# Patient Record
Sex: Male | Born: 1945 | Race: Black or African American | Hispanic: No | Smoking: Never smoker
Health system: Southern US, Community
[De-identification: ages and names within clinical notes are randomized; demographics above are authoritative.]

## PROBLEM LIST (undated history)

## (undated) DIAGNOSIS — E119 Type 2 diabetes mellitus without complications: Secondary | ICD-10-CM

## (undated) DIAGNOSIS — H5440 Blindness, one eye, unspecified eye: Secondary | ICD-10-CM

## (undated) DIAGNOSIS — Z95810 Presence of automatic (implantable) cardiac defibrillator: Secondary | ICD-10-CM

## (undated) DIAGNOSIS — I428 Other cardiomyopathies: Secondary | ICD-10-CM

## (undated) DIAGNOSIS — K219 Gastro-esophageal reflux disease without esophagitis: Secondary | ICD-10-CM

## (undated) DIAGNOSIS — E785 Hyperlipidemia, unspecified: Secondary | ICD-10-CM

## (undated) DIAGNOSIS — F32A Depression, unspecified: Secondary | ICD-10-CM

## (undated) DIAGNOSIS — I5082 Biventricular heart failure: Secondary | ICD-10-CM

## (undated) DIAGNOSIS — F329 Major depressive disorder, single episode, unspecified: Secondary | ICD-10-CM

## (undated) DIAGNOSIS — H543 Unqualified visual loss, both eyes: Secondary | ICD-10-CM

## (undated) DIAGNOSIS — I10 Essential (primary) hypertension: Secondary | ICD-10-CM

## (undated) HISTORY — PX: CARDIAC DEFIBRILLATOR PLACEMENT: SHX171

## (undated) HISTORY — PX: CARDIAC CATHETERIZATION: SHX172

---

## 2013-01-12 ENCOUNTER — Ambulatory Visit: Payer: Self-pay | Admitting: Family

## 2013-02-13 ENCOUNTER — Inpatient Hospital Stay (HOSPITAL_COMMUNITY)
Admission: EM | Admit: 2013-02-13 | Discharge: 2013-02-14 | DRG: 178 | Disposition: A | Discharge status: Home Health | Payer: Medicare Other | Attending: Internal Medicine | Admitting: Internal Medicine

## 2013-02-13 ENCOUNTER — Encounter (HOSPITAL_COMMUNITY): Payer: Self-pay | Admitting: Family Medicine

## 2013-02-13 DIAGNOSIS — R059 Cough, unspecified: Secondary | ICD-10-CM

## 2013-02-13 DIAGNOSIS — E1165 Type 2 diabetes mellitus with hyperglycemia: Secondary | ICD-10-CM | POA: Diagnosis present

## 2013-02-13 DIAGNOSIS — Z7982 Long term (current) use of aspirin: Secondary | ICD-10-CM

## 2013-02-13 DIAGNOSIS — Z79899 Other long term (current) drug therapy: Secondary | ICD-10-CM

## 2013-02-13 DIAGNOSIS — I509 Heart failure, unspecified: Secondary | ICD-10-CM | POA: Diagnosis present

## 2013-02-13 DIAGNOSIS — E785 Hyperlipidemia, unspecified: Secondary | ICD-10-CM

## 2013-02-13 DIAGNOSIS — I428 Other cardiomyopathies: Secondary | ICD-10-CM | POA: Diagnosis present

## 2013-02-13 DIAGNOSIS — Z7902 Long term (current) use of antithrombotics/antiplatelets: Secondary | ICD-10-CM

## 2013-02-13 DIAGNOSIS — I10 Essential (primary) hypertension: Secondary | ICD-10-CM

## 2013-02-13 DIAGNOSIS — Z95810 Presence of automatic (implantable) cardiac defibrillator: Secondary | ICD-10-CM

## 2013-02-13 DIAGNOSIS — R05 Cough: Secondary | ICD-10-CM

## 2013-02-13 DIAGNOSIS — J690 Pneumonitis due to inhalation of food and vomit: Principal | ICD-10-CM | POA: Diagnosis present

## 2013-02-13 DIAGNOSIS — E119 Type 2 diabetes mellitus without complications: Secondary | ICD-10-CM

## 2013-02-13 DIAGNOSIS — IMO0002 Reserved for concepts with insufficient information to code with codable children: Secondary | ICD-10-CM | POA: Diagnosis present

## 2013-02-13 DIAGNOSIS — I429 Cardiomyopathy, unspecified: Secondary | ICD-10-CM

## 2013-02-13 DIAGNOSIS — I5022 Chronic systolic (congestive) heart failure: Secondary | ICD-10-CM | POA: Diagnosis present

## 2013-02-13 DIAGNOSIS — R0602 Shortness of breath: Secondary | ICD-10-CM

## 2013-02-13 NOTE — ED Provider Notes (Signed)
CSN: [id number]     Arrival date & time 02/13/13  2302 History   First MD Initiated Contact with Patient 02/13/13 2306     Chief Complaint  Patient presents with  . Fatigue  . Emesis   (Consider location/radiation/quality/duration/timing/severity/associated sxs/prior Treatment) HPI  Patient is a 67 yo man with diabetes and cardiomyopathy who is BIB EMS from home. Hx obtained mostly from dtr, with whom he lives. Doing well today until around noon when he seemed to become fatigued and weak. Within the next hour he developed multiple episiodes of emesis. Dtr says that he vomited for 10-45m straight. [place] [address] [place]   When patient got home, he started coughing. He went to bed and daughter noticed, shortly thereafter, that his cough worsened and he seemed to have increased WOB. This prompted her to call EMS. Pt tx with Zofran 4mg  iv en route.   Patient says he feels "OK, I guess" other than cough. He says he has felt intermittently SOB. No known fever. Intermittent chest pain which is aching, mild and nonradiating. Denies CP at this time.   Dtr says that the patient has not taken any of his meds for the past 3 days - including Lasix 40mg  bid. Daughter gave him 80mg  Lasix po around 1900 and he has been urinating more than usual. She gave him all of his usual meds tonight.   Past Medical History  Diagnosis Date  . CHF (congestive heart failure)    No past surgical history on file. No family history on file. History  Substance Use Topics  . Smoking status: Not on file  . Smokeless tobacco: Not on file  . Alcohol Use: Not on file    Review of Systems Unable to obtain from the patient due to respiratory distress. Note that level V caveat applies.   Allergies  Review of patient's allergies indicates not on file.  Home Medications  No current outpatient prescriptions on file. SpO2 99% Physical Exam Gen: well developed and well  nourished appearing, respiratory distress noted Head: NCAT Eyes: PERL, EOMI Nose: no epistaixis or rhinorrhea Mouth/throat: mucosa is moist and pink Neck: supple, no stridor, JVD to the angle of the mandible Lungs: Respiratory rate in the 30s, bilateral rales right greater than left, accessory muscle use and respiratory distress Abd: soft, notender, nondistended Back: no ttp, no cva ttp Skin: no rashese, wnl Neuro: CN ii-xii grossly intact, no focal deficits Psyche; mildly anxious, cooperative.   ED Course  Procedures (including critical care time) Results for orders placed during the hospital encounter of 02/13/13 (from the past 48 hour(s))  URINALYSIS, ROUTINE W REFLEX MICROSCOPIC     Status: Abnormal   Collection Time    02/13/13 11:30 PM      Result Value Range   Color, Urine YELLOW  YELLOW   APPearance CLEAR  CLEAR   Specific Gravity, Urine 1.009  1.005 - 1.030   pH 5.5  5.0 - 8.0   Glucose, UA NEGATIVE  NEGATIVE mg/dL   Hgb urine dipstick NEGATIVE  NEGATIVE   Bilirubin Urine NEGATIVE  NEGATIVE   Ketones, ur NEGATIVE  NEGATIVE mg/dL   Protein, ur 30 (*) NEGATIVE mg/dL   Urobilinogen, UA 1.0  0.0 - 1.0 mg/dL   Nitrite NEGATIVE  NEGATIVE   Leukocytes, UA NEGATIVE  NEGATIVE  URINE MICROSCOPIC-ADD ON     Status: Abnormal   Collection Time    02/13/13 11:30  PM      Result Value Range   WBC, UA 0-2  <3 WBC/hpf   RBC / HPF 0-2  <3 RBC/hpf   Bacteria, UA RARE  RARE   Casts HYALINE CASTS (*) NEGATIVE   Urine-Other MUCOUS PRESENT    CBC WITH DIFFERENTIAL     Status: Abnormal   Collection Time    02/13/13 11:46 PM      Result Value Range   WBC 8.2  4.0 - 10.5 K/uL   RBC 5.15  4.22 - 5.81 MIL/uL   Hemoglobin 16.0  13.0 - 17.0 g/dL   HCT 16.1  09.6 - 04.5 %   MCV 90.3  78.0 - 100.0 fL   MCH 31.1  26.0 - 34.0 pg   MCHC 34.4  30.0 - 36.0 g/dL   RDW 40.9  81.1 - 91.4 %   Platelets 156  150 - 400 K/uL   Neutrophils Relative % 78 (*) 43 - 77 %   Neutro Abs 6.3  1.7 - 7.7  K/uL   Lymphocytes Relative 8 (*) 12 - 46 %   Lymphs Abs 0.7  0.7 - 4.0 K/uL   Monocytes Relative 14 (*) 3 - 12 %   Monocytes Absolute 1.1 (*) 0.1 - 1.0 K/uL   Eosinophils Relative 0  0 - 5 %   Eosinophils Absolute 0.0  0.0 - 0.7 K/uL   Basophils Relative 0  0 - 1 %   Basophils Absolute 0.0  0.0 - 0.1 K/uL  COMPREHENSIVE METABOLIC PANEL     Status: Abnormal   Collection Time    02/13/13 11:46 PM      Result Value Range   Sodium 139  135 - 145 mEq/L   Potassium 3.5  3.5 - 5.1 mEq/L   Chloride 102  96 - 112 mEq/L   CO2 24  19 - 32 mEq/L   Glucose, Bld 158 (*) 70 - 99 mg/dL   BUN 13  6 - 23 mg/dL   Creatinine, Ser 7.82  0.50 - 1.35 mg/dL   Calcium 9.1  8.4 - 95.6 mg/dL   Total Protein 6.9  6.0 - 8.3 g/dL   Albumin 3.6  3.5 - 5.2 g/dL   AST 22  0 - 37 U/L   ALT 11  0 - 53 U/L   Alkaline Phosphatase 194 (*) 39 - 117 U/L   Total Bilirubin 1.9 (*) 0.3 - 1.2 mg/dL   GFR calc non Af Amer >90  >90 mL/min   GFR calc Af Amer >90  >90 mL/min   Comment: (NOTE)     The eGFR has been calculated using the CKD EPI equation.     This calculation has not been validated in all clinical situations.     eGFR's persistently <90 mL/min signify possible Chronic Kidney     Disease.  LACTIC ACID, PLASMA     Status: None   Collection Time    02/13/13 11:46 PM      Result Value Range   Lactic Acid, Venous 2.1  0.5 - 2.2 mmol/L  PRO B NATRIURETIC PEPTIDE     Status: Abnormal   Collection Time    02/13/13 11:46 PM      Result Value Range   Pro B Natriuretic peptide (BNP) 7676.0 (*) 0 - 125 pg/mL  POCT I-STAT, CHEM 8     Status: Abnormal   Collection Time    02/14/13 12:04 AM      Result Value Range   Sodium  143  135 - 145 mEq/L   Potassium 3.4 (*) 3.5 - 5.1 mEq/L   Chloride 104  96 - 112 mEq/L   BUN 11  6 - 23 mg/dL   Creatinine, Ser 4.09  0.50 - 1.35 mg/dL   Glucose, Bld 811 (*) 70 - 99 mg/dL   Calcium, Ion 9.14 (*) 1.13 - 1.30 mmol/L   TCO2 25  0 - 100 mmol/L   Hemoglobin 17.7 (*) 13.0 -  17.0 g/dL   HCT 78.2  95.6 - 21.3 %  POCT I-STAT TROPONIN I     Status: None   Collection Time    02/14/13 12:46 AM      Result Value Range   Troponin i, poc 0.02  0.00 - 0.08 ng/mL   Comment 3            Comment: Due to the release kinetics of cTnI,     a negative result within the first hours     of the onset of symptoms does not rule out     myocardial infarction with certainty.     If myocardial infarction is still suspected,     repeat the test at appropriate intervals.  TROPONIN I     Status: None   Collection Time    02/14/13  1:53 AM      Result Value Range   Troponin I <0.30  <0.30 ng/mL   Comment:            Due to the release kinetics of cTnI,     a negative result within the first hours     of the onset of symptoms does not rule out     myocardial infarction with certainty.     If myocardial infarction is still suspected,     repeat the test at appropriate intervals.  BASIC METABOLIC PANEL     Status: Abnormal   Collection Time    02/14/13  3:20 AM      Result Value Range   Sodium 139  135 - 145 mEq/L   Potassium 3.4 (*) 3.5 - 5.1 mEq/L   Chloride 102  96 - 112 mEq/L   CO2 24  19 - 32 mEq/L   Glucose, Bld 237 (*) 70 - 99 mg/dL   BUN 12  6 - 23 mg/dL   Creatinine, Ser 0.86  0.50 - 1.35 mg/dL   Calcium 8.9  8.4 - 57.8 mg/dL   GFR calc non Af Amer >90  >90 mL/min   GFR calc Af Amer >90  >90 mL/min   Comment: (NOTE)     The eGFR has been calculated using the CKD EPI equation.     This calculation has not been validated in all clinical situations.     eGFR's persistently <90 mL/min signify possible Chronic Kidney     Disease.  CBC     Status: Abnormal   Collection Time    02/14/13  3:20 AM      Result Value Range   WBC 11.1 (*) 4.0 - 10.5 K/uL   RBC 4.85  4.22 - 5.81 MIL/uL   Hemoglobin 15.2  13.0 - 17.0 g/dL   HCT 46.9  62.9 - 52.8 %   MCV 88.9  78.0 - 100.0 fL   MCH 31.3  26.0 - 34.0 pg   MCHC 35.3  30.0 - 36.0 g/dL   RDW 41.3  24.4 - 01.0 %    Platelets 143 (*) 150 - 400 K/uL  TROPONIN I  Status: None   Collection Time    02/14/13  8:10 AM      Result Value Range   Troponin I <0.30  <0.30 ng/mL   Comment:            Due to the release kinetics of cTnI,     a negative result within the first hours     of the onset of symptoms does not rule out     myocardial infarction with certainty.     If myocardial infarction is still suspected,     repeat the test at appropriate intervals.  LIPASE, BLOOD     Status: None   Collection Time    02/14/13  8:10 AM      Result Value Range   Lipase 17  11 - 59 U/L  GLUCOSE, CAPILLARY     Status: Abnormal   Collection Time    02/14/13  8:25 AM      Result Value Range   Glucose-Capillary 204 (*) 70 - 99 mg/dL  GLUCOSE, CAPILLARY     Status: Abnormal   Collection Time    02/14/13 11:35 AM      Result Value Range   Glucose-Capillary 173 (*) 70 - 99 mg/dL  TROPONIN I     Status: None   Collection Time    02/14/13  2:14 PM      Result Value Range   Troponin I <0.30  <0.30 ng/mL   Comment:            Due to the release kinetics of cTnI,     a negative result within the first hours     of the onset of symptoms does not rule out     myocardial infarction with certainty.     If myocardial infarction is still suspected,     repeat the test at appropriate intervals.    MDM   Patient presents with mixed picture of aspiration pneumonia and pulmonary edema. We are treating empirically with Clindamycin and blood cultures have been obtained. Tx with single dose of IV lasix. Supportive tx with supplemental 02. Case discussed with hospitalist who will see and admit.  CRITICAL CARE Performed by: Brandt Loosen   Total critical care time: 47m  Critical care time was exclusive of separately billable procedures and treating other patients.  Critical care was necessary to treat or prevent imminent or life-threatening deterioration.  Critical care was time spent personally by me on the  following activities: development of treatment plan with patient and/or surrogate as well as nursing, discussions with consultants, evaluation of patient's response to treatment, examination of patient, obtaining history from patient or surrogate, ordering and performing treatments and interventions, ordering and review of laboratory studies, ordering and review of radiographic studies, pulse oximetry and re-evaluation of patient's condition.     Brandt Loosen, MD 02/15/13 [phone]

## 2013-02-13 NOTE — ED Notes (Signed)
Pt from home via [facility] with c/o lethargy, fatigue, nausea, and emesisx1. Pt with hx CHF, presents with BLE edema +1, lungs diminished at bilateral bases, dry non-productive cough, and unremarkable 12-lead per EMS.  EMS reports pt had slight tremor but was fixed with a warm blanket.  Pt placed on non-rebreather by EMS with improvement to color and alertness.  Pt given 4mg  Zofran IV PTA.

## 2013-02-14 ENCOUNTER — Emergency Department (HOSPITAL_COMMUNITY): Payer: Medicare Other

## 2013-02-14 ENCOUNTER — Encounter (HOSPITAL_COMMUNITY): Payer: Self-pay | Admitting: Internal Medicine

## 2013-02-14 DIAGNOSIS — I428 Other cardiomyopathies: Secondary | ICD-10-CM

## 2013-02-14 DIAGNOSIS — I429 Cardiomyopathy, unspecified: Secondary | ICD-10-CM | POA: Diagnosis present

## 2013-02-14 DIAGNOSIS — E1165 Type 2 diabetes mellitus with hyperglycemia: Secondary | ICD-10-CM | POA: Diagnosis present

## 2013-02-14 DIAGNOSIS — E119 Type 2 diabetes mellitus without complications: Secondary | ICD-10-CM

## 2013-02-14 DIAGNOSIS — I10 Essential (primary) hypertension: Secondary | ICD-10-CM | POA: Diagnosis present

## 2013-02-14 DIAGNOSIS — E785 Hyperlipidemia, unspecified: Secondary | ICD-10-CM | POA: Diagnosis present

## 2013-02-14 DIAGNOSIS — R05 Cough: Secondary | ICD-10-CM

## 2013-02-14 LAB — POCT I-STAT TROPONIN I

## 2013-02-14 LAB — URINALYSIS, ROUTINE W REFLEX MICROSCOPIC
Glucose, UA: NEGATIVE mg/dL
Leukocytes, UA: NEGATIVE
Nitrite: NEGATIVE
Protein, ur: 30 mg/dL — AB
pH: 5.5 (ref 5.0–8.0)

## 2013-02-14 LAB — BASIC METABOLIC PANEL
BUN: 12 mg/dL (ref 6–23)
Calcium: 8.9 mg/dL (ref 8.4–10.5)
Creatinine, Ser: 0.8 mg/dL (ref 0.50–1.35)
GFR calc Af Amer: >90 mL/min (ref >90)
GFR calc non Af Amer: >90 mL/min (ref >90)
Potassium: 3.4 mEq/L — ABNORMAL LOW (ref 3.5–5.1)

## 2013-02-14 LAB — URINE MICROSCOPIC-ADD ON

## 2013-02-14 LAB — LACTIC ACID, PLASMA: Lactic Acid, Venous: 2.1 mmol/L (ref 0.5–2.2)

## 2013-02-14 LAB — CBC WITH DIFFERENTIAL/PLATELET
Basophils Relative: 0 % (ref 0–1)
Eosinophils Absolute: 0 10*3/uL (ref 0.0–0.7)
Eosinophils Relative: 0 % (ref 0–5)
HCT: 46.5 % (ref 39.0–52.0)
Hemoglobin: 16 g/dL (ref 13.0–17.0)
MCH: 31.1 pg (ref 26.0–34.0)
MCHC: 34.4 g/dL (ref 30.0–36.0)
MCV: 90.3 fL (ref 78.0–100.0)
Monocytes Absolute: 1.1 10*3/uL — ABNORMAL HIGH (ref 0.1–1.0)
Monocytes Relative: 14 % — ABNORMAL HIGH (ref 3–12)
Neutrophils Relative %: 78 % — ABNORMAL HIGH (ref 43–77)

## 2013-02-14 LAB — COMPREHENSIVE METABOLIC PANEL
Albumin: 3.6 g/dL (ref 3.5–5.2)
BUN: 13 mg/dL (ref 6–23)
Creatinine, Ser: 0.82 mg/dL (ref 0.50–1.35)
GFR calc Af Amer: >90 mL/min (ref >90)
Total Protein: 6.9 g/dL (ref 6.0–8.3)

## 2013-02-14 LAB — GLUCOSE, CAPILLARY: Glucose-Capillary: 204 mg/dL — ABNORMAL HIGH (ref 70–99)

## 2013-02-14 LAB — TROPONIN I
Troponin I: <0.3 ng/mL (ref <0.30)
Troponin I: <0.3 ng/mL (ref <0.30)
Troponin I: <0.3 ng/mL (ref <0.30)

## 2013-02-14 LAB — POCT I-STAT, CHEM 8
Calcium, Ion: 1.09 mmol/L — ABNORMAL LOW (ref 1.13–1.30)
Chloride: 104 mEq/L (ref 96–112)
HCT: 52 % (ref 39.0–52.0)
Hemoglobin: 17.7 g/dL — ABNORMAL HIGH (ref 13.0–17.0)

## 2013-02-14 LAB — CBC
Hemoglobin: 15.2 g/dL (ref 13.0–17.0)
MCHC: 35.3 g/dL (ref 30.0–36.0)
Platelets: 143 10*3/uL — ABNORMAL LOW (ref 150–400)
RDW: 15.4 % (ref 11.5–15.5)

## 2013-02-14 LAB — LIPASE, BLOOD: Lipase: 17 U/L (ref 11–59)

## 2013-02-14 LAB — PRO B NATRIURETIC PEPTIDE: Pro B Natriuretic peptide (BNP): 7676 pg/mL — ABNORMAL HIGH (ref 0–125)

## 2013-02-14 MED ORDER — ONDANSETRON HCL 4 MG/2ML IJ SOLN: 4.0000 mg | Freq: Four times a day (QID) | INTRAMUSCULAR | Status: DC | PRN

## 2013-02-14 MED ORDER — SODIUM CHLORIDE 0.9 % IJ SOLN: 3.0000 mL | Freq: Two times a day (BID) | INTRAMUSCULAR | Status: DC

## 2013-02-14 MED ORDER — ZOLPIDEM TARTRATE 5 MG PO TABS: 5.0000 mg | ORAL_TABLET | Freq: Every evening | ORAL | Status: DC | PRN

## 2013-02-14 MED ORDER — ACETAMINOPHEN 325 MG PO TABS: 650.0000 mg | ORAL_TABLET | Freq: Four times a day (QID) | ORAL | Status: DC | PRN

## 2013-02-14 MED ORDER — ENOXAPARIN SODIUM 40 MG/0.4ML ~~LOC~~ SOLN
40.0000 mg | SUBCUTANEOUS | Status: DC
  Filled 2013-02-14: qty 0.4

## 2013-02-14 MED ORDER — SIMVASTATIN 5 MG PO TABS
5.0000 mg | ORAL_TABLET | Freq: Every day | ORAL | Status: DC
  Filled 2013-02-14: qty 1

## 2013-02-14 MED ORDER — CLOPIDOGREL BISULFATE 75 MG PO TABS
75.0000 mg | ORAL_TABLET | Freq: Every day | ORAL | Status: DC
  Administered 2013-02-14: 09:00:00 75 mg via ORAL
  Filled 2013-02-14 (×2): qty 1

## 2013-02-14 MED ORDER — LEVALBUTEROL HCL 0.63 MG/3ML IN NEBU
0.6300 mg | INHALATION_SOLUTION | Freq: Four times a day (QID) | RESPIRATORY_TRACT | Status: DC
  Filled 2013-02-14 (×4): qty 3

## 2013-02-14 MED ORDER — ACETAMINOPHEN 650 MG RE SUPP: 650.0000 mg | Freq: Four times a day (QID) | RECTAL | Status: DC | PRN

## 2013-02-14 MED ORDER — INSULIN GLARGINE 100 UNIT/ML ~~LOC~~ SOLN: 25.0000 [IU] | Freq: Every day | SUBCUTANEOUS | Status: DC

## 2013-02-14 MED ORDER — ASPIRIN EC 81 MG PO TBEC
81.0000 mg | DELAYED_RELEASE_TABLET | Freq: Every day | ORAL | Status: DC
  Administered 2013-02-14: 10:00:00 81 mg via ORAL
  Filled 2013-02-14: qty 1

## 2013-02-14 MED ORDER — SODIUM CHLORIDE 0.9 % IJ SOLN
3.0000 mL | Freq: Two times a day (BID) | INTRAMUSCULAR | Status: DC
  Administered 2013-02-14: 3 mL via INTRAVENOUS

## 2013-02-14 MED ORDER — CLINDAMYCIN PHOSPHATE 600 MG/50ML IV SOLN
600.0000 mg | Freq: Three times a day (TID) | INTRAVENOUS | Status: DC
  Administered 2013-02-14: 05:00:00 600 mg via INTRAVENOUS
  Filled 2013-02-14 (×4): qty 50

## 2013-02-14 MED ORDER — CLINDAMYCIN HCL 300 MG PO CAPS: 300.0000 mg | ORAL_CAPSULE | Freq: Three times a day (TID) | ORAL | Status: DC

## 2013-02-14 MED ORDER — INSULIN ASPART 100 UNIT/ML ~~LOC~~ SOLN
0.0000 [IU] | Freq: Three times a day (TID) | SUBCUTANEOUS | Status: DC
  Administered 2013-02-14 (×2): 2 [IU] via SUBCUTANEOUS

## 2013-02-14 MED ORDER — ONDANSETRON HCL 4 MG PO TABS: 4.0000 mg | ORAL_TABLET | Freq: Four times a day (QID) | ORAL | Status: DC | PRN

## 2013-02-14 MED ORDER — CLINDAMYCIN PHOSPHATE 600 MG/50ML IV SOLN
600.0000 mg | Freq: Once | INTRAVENOUS | Status: AC
  Administered 2013-02-14: 600 mg via INTRAVENOUS
  Filled 2013-02-14: qty 50

## 2013-02-14 MED ORDER — CARVEDILOL 6.25 MG PO TABS
6.2500 mg | ORAL_TABLET | Freq: Two times a day (BID) | ORAL | Status: DC
  Administered 2013-02-14: 6.25 mg via ORAL
  Filled 2013-02-14 (×3): qty 1

## 2013-02-14 MED ORDER — FUROSEMIDE 40 MG PO TABS
40.0000 mg | ORAL_TABLET | Freq: Two times a day (BID) | ORAL | Status: DC
  Filled 2013-02-14 (×2): qty 1

## 2013-02-14 MED ORDER — FUROSEMIDE 10 MG/ML IJ SOLN
40.0000 mg | Freq: Two times a day (BID) | INTRAMUSCULAR | Status: DC
  Administered 2013-02-14: 40 mg via INTRAVENOUS
  Filled 2013-02-14 (×3): qty 4

## 2013-02-14 MED ORDER — INSULIN GLARGINE 100 UNIT/ML ~~LOC~~ SOLN
25.0000 [IU] | Freq: Every day | SUBCUTANEOUS | Status: DC
  Filled 2013-02-14: qty 0.25

## 2013-02-14 NOTE — Discharge Summary (Signed)
PATIENT DETAILS Name: Gary Gallegos Age: 67 y.o. Sex: male Date of Birth: 03-Dec-1945 MRN: [id number]. Admit Date: 02/13/2013 Admitting Physician: Eduard Clos, MD WUJ:[id number] Not In System  Recommendations for Outpatient Follow-up:  1. Please recheck Chemistry/LFT's during next visit-had mild Alk Phosphatase elevation-if still persistent then please commence on further work up 2. Will need referral to cardiology  PRIMARY DISCHARGE DIAGNOSIS:  Active Problems:   Probable Aspiration PNA   Nausea/Vomiting   Cardiomyopathy   Diabetes mellitus   HTN (hypertension)   Hyperlipidemia      PAST MEDICAL HISTORY: Past Medical History  Diagnosis Date  . CHF (congestive heart failure)   . Diabetes mellitus without complication     DISCHARGE MEDICATIONS:   Medication List         aspirin EC 81 MG tablet  Take 81 mg by mouth daily.     carvedilol 6.25 MG tablet  Commonly known as:  COREG  Take 6.25 mg by mouth 2 (two) times daily with a meal.     clindamycin 300 MG capsule  Commonly known as:  CLEOCIN  Take 1 capsule (300 mg total) by mouth 3 (three) times daily.     clopidogrel 75 MG tablet  Commonly known as:  PLAVIX  Take 75 mg by mouth daily.     furosemide 40 MG tablet  Commonly known as:  LASIX  Take 40 mg by mouth 2 (two) times daily.     insulin glargine 100 UNIT/ML injection  Commonly known as:  LANTUS  Inject 0.25 mLs (25 Units total) into the skin at bedtime.     ondansetron 4 MG tablet  Commonly known as:  ZOFRAN  Take 1 tablet (4 mg total) by mouth every 6 (six) hours as needed for nausea.     simvastatin 5 MG tablet  Commonly known as:  ZOCOR  Take 5 mg by mouth at bedtime.     zolpidem 5 MG tablet  Commonly known as:  AMBIEN  Take 5 mg by mouth at bedtime as needed for sleep.        ALLERGIES:   Allergies  Allergen Reactions  . Penicillins Nausea And Vomiting  . Sulfa Antibiotics Nausea And Vomiting    BRIEF HPI:  See H&P, Labs,  Consult and Test reports for all details in brief, Gary Gallegos is a 67 y.o. male with history of cardiomyopathy status post AICD placement, diabetes mellitus type 2, hyperlipidemia who had recently moved to [place] last month was traveling with his daughter in the car when suddenly he started having nausea and vomiting. Patient had multiple episodes of nausea vomiting in 15 minutes period. After which patient had persistent nonproductive cough with mild shortness of breath. Since the cough was not relieving patient came to the ER.  CONSULTATIONS:   cardiology  PERTINENT RADIOLOGIC STUDIES: Dg Chest 2 View  02/14/2013   CLINICAL DATA:  Weakness.  EXAM: CHEST  2 VIEW  COMPARISON:  None.  FINDINGS: Left AICD is in place with single lead tip in the right ventricle. Cardiomegaly. No focal airspace opacity or edema. There is a small right pleural effusion. No pneumothorax. No acute bony abnormality.  IMPRESSION: Cardiomegaly. Small right pleural effusion.   Electronically Signed   By: Charlett Nose   On: 02/14/2013 00:43     PERTINENT LAB RESULTS: CBC:  Recent Labs  02/13/13 2346 02/14/13 0004 02/14/13 0320  WBC 8.2  --  11.1*  HGB 16.0 17.7* 15.2  HCT 46.5 52.0 43.1  PLT 156  --  143*   CMET CMP     Component Value Date/Time   NA 139 02/14/2013 0320   K 3.4* 02/14/2013 0320   CL 102 02/14/2013 0320   CO2 24 02/14/2013 0320   GLUCOSE 237* 02/14/2013 0320   BUN 12 02/14/2013 0320   CREATININE 0.80 02/14/2013 0320   CALCIUM 8.9 02/14/2013 0320   PROT 6.9 02/13/2013 2346   ALBUMIN 3.6 02/13/2013 2346   AST 22 02/13/2013 2346   ALT 11 02/13/2013 2346   ALKPHOS 194* 02/13/2013 2346   BILITOT 1.9* 02/13/2013 2346   GFRNONAA >90 02/14/2013 0320   GFRAA >90 02/14/2013 0320    GFR Estimated Creatinine Clearance: 86.1 ml/min (by C-G formula based on Cr of 0.8).  Recent Labs  02/14/13 0810  LIPASE 17    Recent Labs  02/14/13 0153 02/14/13 0810  TROPONINI <0.30 <0.30   No components  found with this basename: POCBNP,  No results found for this basename: DDIMER,  in the last 72 hours No results found for this basename: HGBA1C,  in the last 72 hours No results found for this basename: CHOL, HDL, LDLCALC, TRIG, CHOLHDL, LDLDIRECT,  in the last 72 hours No results found for this basename: TSH, T4TOTAL, FREET3, T3FREE, THYROIDAB,  in the last 72 hours No results found for this basename: VITAMINB12, FOLATE, FERRITIN, TIBC, IRON, RETICCTPCT,  in the last 72 hours Coags: No results found for this basename: PT, INR,  in the last 72 hours Microbiology: No results found for this or any previous visit (from the past 240 hour(s)).   BRIEF HOSPITAL COURSE:  Nausea/Vomiting  -resolved.  -apparently occurred after patient ate "chinese food", he was managed with supportive care, he has now tolerated a regular diet, with no recurrence of any of his symptoms. Abdomen is benign on exam  -suspect this may have been a viral syndrome/?food poisoning  - lipase was within normal limits -cardiac enzymes neg   Shortness of Breath  -?aspiration-seems to have resolved with IV Clindamycin and Lasix  -doubt any significant CHF component  -c/w Clindamycin on discharge for 4 more days and then stop -lungs clear on exam, O2 sat on room air in the high 90's, ambulated-O2 sat remained in the 90's on room air as well.  Hx of CHF  -not known whether this is systolic or diastolic, suspect the former  -c/w IV Lasix for now, clinically compensated this am  -he plans to be living in [place]-he needs to find a cardiologist of his choice-spoke with patient's daughter-patient apparently has a appt with a MD at [facility] on 9/11, he will need referral to cardiology. I have provided her the no for [facility], so she can call and make an appointment.  DM  -CBG stable  -c/w Lantus on discharge  Dyslipidemia  -c/w Statins   HTN  -controlled with current meds   Mild Alk Phosphatase  elevation  - Abd Ultrasound was contemplated, but abdomen is benign on exam, lipase normal, patient tolerating diet-therefore will have patient follow up with PCP, where a repeat LFT's can be drawn, if Alk Phos is still elevated, then further work up can be then commenced. For now will hold off on Abd Ultrasound. This was explained to both patient and daughter, who are agreeable.  TODAY-DAY OF DISCHARGE:  Subjective:   Gary Gallegos today has no headache,no chest abdominal pain,no new weakness tingling or numbness, feels much better wants to go home today.  Objective:   Blood pressure 111/76, pulse 90, temperature 99 F (37.2 C), temperature source Oral, resp. rate 22, height 5\' 7"  (1.702 m), weight 75.3 kg (166 lb 0.1 oz), SpO2 97.00%.  Intake/Output Summary (Last 24 hours) at 02/14/13 1408 Last data filed at 02/14/13 0400  Gross per 24 hour  Intake      0 ml  Output    300 ml  Net   -300 ml   Filed Weights   02/14/13 0203 02/14/13 0506  Weight: 75.7 kg (166 lb 14.2 oz) 75.3 kg (166 lb 0.1 oz)    Exam Awake Alert, Oriented *3, No new F.N deficits, Normal affect [place].AT,PERRAL Supple Neck,No JVD, No cervical lymphadenopathy appriciated.  Symmetrical Chest wall movement, Good air movement bilaterally, CTAB RRR,No Gallops,Rubs or new Murmurs, No Parasternal Heave +ve B.Sounds, Abd Soft, Non tender, No organomegaly appriciated, No rebound -guarding or rigidity. No Cyanosis, Clubbing or edema, No new Rash or bruise  DISCHARGE CONDITION: Stable  DISPOSITION: Home with home health services  DISCHARGE INSTRUCTIONS:    Activity:  As tolerated with Full fall precautions use walker/cane & assistance as needed  Diet recommendation: Diabetic Diet Heart Healthy diet   Discharge Orders   Future Orders Complete By Expires   (HEART FAILURE PATIENTS) Call MD:  Anytime you have any of the following symptoms: 1) 3 pound weight gain in 24 hours or 5 pounds in 1 week 2) shortness of  breath, with or without a dry hacking cough 3) swelling in the hands, feet or stomach 4) if you have to sleep on extra pillows at night in order to breathe.  As directed    Call MD for:  persistant nausea and vomiting  As directed    Call MD for:  severe uncontrolled pain  As directed    Diet - low sodium heart healthy  As directed    Increase activity slowly  As directed       Follow-up Information   Please follow up. (keep existing appt on 02/26/13)    Contact information:   Primary Care Practitioner      Follow up with [facility] Main Office [facility]). Schedule an appointment as soon as possible for a visit in 2 weeks. (Cardiologist of your choice)    Specialty:  Cardiology   Contact information:   [address], [address] [place] [address] [phone]      Total Time spent on discharge equals 45 minutes.  SignedJeoffrey Massed 02/14/2013 2:08 PM

## 2013-02-14 NOTE — Progress Notes (Signed)
Pt admitted to the unit with family. Pt is stable, alert and oriented per baseline. Oriented to room, staff, and call bell. Educated to call for any assistance. Bed in lowest position, call bell within reach- will continue to monitor. 

## 2013-02-14 NOTE — Progress Notes (Signed)
Pt is at 97% O2 saturation on room air.   Will continue to monitor.  Peter Congo RN

## 2013-02-14 NOTE — Progress Notes (Signed)
PATIENT DETAILS Name: Gary Gallegos Age: 67 y.o. Sex: male Date of Birth: 12/17/1945 Admit Date: 02/13/2013 Admitting Physician Eduard Clos, MD [id number] Not In System  Subjective: No further nausea or vomiting, tolerated diet well this am. No shortness of breath-lying comfortably this am.  Assessment/Plan: Nausea/Vomiting -resolved. -apparently occurred after patient ate "chinese food" -abdomen is benign on exam -tolerated heart healthy diet this am for breakfast -suspect this may have been a viral syndrome/?food poisoning -will check lipase -cardiac enzymes neg so far  Shortness of Breath -?aspiration-seems to have resolved with IV Clindamycin and Lasix -doubt any significant CHF component -c/w Clindamycin -lungs clear on exam, O2 sat on room air in the high 90's  Hx of CHF -not known whether this is systolic or diastolic, suspect the former -c/w IV Lasix for now, clinically compensated this am -he plans to be living in [place]-he needs to find a cardiologist of his choice  DM -CBG stable -c/w Lantus/SSI  Dyslipidemia -c/w Statins  HTN -controlled with current meds  Mild Alk Phosphatase elevation -await Abd Ultrasound  Disposition: Remain inpatient-Suspect home later today  DVT Prophylaxis: Prophylactic Lovenox   Code Status: Full code   Family Communication Left message for daughter  Procedures:  None  CONSULTS:  None   MEDICATIONS: Scheduled Meds: . aspirin EC  81 mg Oral Daily  . carvedilol  6.25 mg Oral BID WC  . clindamycin (CLEOCIN) IV  600 mg Intravenous Q8H  . clopidogrel  75 mg Oral Q breakfast  . enoxaparin (LOVENOX) injection  40 mg Subcutaneous Q24H  . furosemide  40 mg Intravenous Q12H  . insulin aspart  0-9 Units Subcutaneous TID WC  . insulin glargine  25 Units Subcutaneous QHS  . levalbuterol  0.63 mg Nebulization Q6H  . simvastatin  5 mg Oral QHS  . sodium chloride  3 mL Intravenous Q12H  . sodium chloride  3 mL  Intravenous Q12H   Continuous Infusions:  PRN Meds:.acetaminophen, acetaminophen, ondansetron (ZOFRAN) IV, ondansetron, zolpidem  Antibiotics: Anti-infectives   Start     Dose/Rate Route Frequency Ordered Stop   02/14/13 0600  clindamycin (CLEOCIN) IVPB 600 mg     600 mg 100 mL/hr over 30 Minutes Intravenous 3 times per day 02/14/13 0204     02/14/13 0045  clindamycin (CLEOCIN) IVPB 600 mg     600 mg 100 mL/hr over 30 Minutes Intravenous  Once 02/14/13 0039 02/14/13 0138       PHYSICAL EXAM: Vital signs in last 24 hours: Filed Vitals:   02/14/13 0130 02/14/13 0203 02/14/13 0506 02/14/13 0900  BP: 117/63 108/75 111/76   Pulse: 95 100 90   Temp:  98.6 F (37 C) 99 F (37.2 C)   TempSrc:  Oral Oral   Resp: 24 24 22    Height:  5\' 7"  (1.702 m)    Weight:  75.7 kg (166 lb 14.2 oz) 75.3 kg (166 lb 0.1 oz)   SpO2: 97% 93% 93% 97%    Weight change:  Filed Weights   02/14/13 0203 02/14/13 0506  Weight: 75.7 kg (166 lb 14.2 oz) 75.3 kg (166 lb 0.1 oz)   Body mass index is 25.99 kg/(m^2).   Gen Exam: Awake and alert with clear speech.   Neck: Supple, No JVD.   Chest: B/L Clear.   CVS: S1 S2 Regular, no murmurs.  Abdomen: soft, BS +, non tender, non distended.  Extremities: no edema, lower extremities warm to touch. Neurologic: Non Focal.   Skin: No Rash.  Wounds: N/A.    Intake/Output from previous day:  Intake/Output Summary (Last 24 hours) at 02/14/13 1011 Last data filed at 02/14/13 0400  Gross per 24 hour  Intake      0 ml  Output    300 ml  Net   -300 ml     LAB RESULTS: CBC  Recent Labs Lab 02/13/13 2346 02/14/13 0004 02/14/13 0320  WBC 8.2  --  11.1*  HGB 16.0 17.7* 15.2  HCT 46.5 52.0 43.1  PLT 156  --  143*  MCV 90.3  --  88.9  MCH 31.1  --  31.3  MCHC 34.4  --  35.3  RDW 15.5  --  15.4  LYMPHSABS 0.7  --   --   MONOABS 1.1*  --   --   EOSABS 0.0  --   --   BASOSABS 0.0  --   --     Chemistries   Recent Labs Lab 02/13/13 2346  02/14/13 0004 02/14/13 0320  NA 139 143 139  K 3.5 3.4* 3.4*  CL 102 104 102  CO2 24  --  24  GLUCOSE 158* 162* 237*  BUN 13 11 12   CREATININE 0.82 0.90 0.80  CALCIUM 9.1  --  8.9    CBG:  Recent Labs Lab 02/14/13 0825  GLUCAP 204*    GFR Estimated Creatinine Clearance: 86.1 ml/min (by C-G formula based on Cr of 0.8).  Coagulation profile No results found for this basename: INR, PROTIME,  in the last 168 hours  Cardiac Enzymes  Recent Labs Lab 02/14/13 0153 02/14/13 0810  TROPONINI <0.30 <0.30    No components found with this basename: POCBNP,  No results found for this basename: DDIMER,  in the last 72 hours No results found for this basename: HGBA1C,  in the last 72 hours No results found for this basename: CHOL, HDL, LDLCALC, TRIG, CHOLHDL, LDLDIRECT,  in the last 72 hours No results found for this basename: TSH, T4TOTAL, FREET3, T3FREE, THYROIDAB,  in the last 72 hours No results found for this basename: VITAMINB12, FOLATE, FERRITIN, TIBC, IRON, RETICCTPCT,  in the last 72 hours No results found for this basename: LIPASE, AMYLASE,  in the last 72 hours  Urine Studies No results found for this basename: UACOL, UAPR, USPG, UPH, UTP, UGL, UKET, UBIL, UHGB, UNIT, UROB, ULEU, UEPI, UWBC, URBC, UBAC, CAST, CRYS, UCOM, BILUA,  in the last 72 hours  MICROBIOLOGY: No results found for this or any previous visit (from the past 240 hour(s)).  RADIOLOGY STUDIES/RESULTS: Dg Chest 2 View  02/14/2013   CLINICAL DATA:  Weakness.  EXAM: CHEST  2 VIEW  COMPARISON:  None.  FINDINGS: Left AICD is in place with single lead tip in the right ventricle. Cardiomegaly. No focal airspace opacity or edema. There is a small right pleural effusion. No pneumothorax. No acute bony abnormality.  IMPRESSION: Cardiomegaly. Small right pleural effusion.   Electronically Signed   By: Charlett Nose   On: 02/14/2013 00:43    Jeoffrey Massed, MD  [facility] Pager:[phone]  If 7PM-7AM, please contact night-coverage [email or web address] Password [email or web address] 02/14/2013, 10:11 AM   LOS: 1 day

## 2013-02-14 NOTE — Progress Notes (Signed)
Gary Gallegos to be D/C'd Home per MD order.  Discussed with the patient and all questions fully answered.    Medication List         aspirin EC 81 MG tablet  Take 81 mg by mouth daily.     carvedilol 6.25 MG tablet  Commonly known as:  COREG  Take 6.25 mg by mouth 2 (two) times daily with a meal.     clindamycin 300 MG capsule  Commonly known as:  CLEOCIN  Take 1 capsule (300 mg total) by mouth 3 (three) times daily.     clopidogrel 75 MG tablet  Commonly known as:  PLAVIX  Take 75 mg by mouth daily.     furosemide 40 MG tablet  Commonly known as:  LASIX  Take 40 mg by mouth 2 (two) times daily.     insulin glargine 100 UNIT/ML injection  Commonly known as:  LANTUS  Inject 0.25 mLs (25 Units total) into the skin at bedtime.     ondansetron 4 MG tablet  Commonly known as:  ZOFRAN  Take 1 tablet (4 mg total) by mouth every 6 (six) hours as needed for nausea.     simvastatin 5 MG tablet  Commonly known as:  ZOCOR  Take 5 mg by mouth at bedtime.     zolpidem 5 MG tablet  Commonly known as:  AMBIEN  Take 5 mg by mouth at bedtime as needed for sleep.        VVS, Skin clean, dry and intact without evidence of skin break down, no evidence of skin tears noted. IV catheter discontinued intact. Site without signs and symptoms of complications. Dressing and pressure applied.  An After Visit Summary was printed and given to the patient and daughter. Follow up appointments , new prescriptions and medication administration times given. CHF s/s discussed and teach back given. Lasix administration times discussed. Patient escorted via WC, and D/C home via private auto.  Cindra Eves, RN 02/14/2013 3:03 PM

## 2013-02-14 NOTE — Progress Notes (Signed)
   CARE MANAGEMENT NOTE 02/14/2013  Patient:  Gary Gallegos, Gary Gallegos   Account Number:  [id number]  Date Initiated:  02/14/2013  Documentation initiated by:  [facility]  Subjective/Objective Assessment:   adm w/Cough and shortness of breath     Action/Plan:   Anticipated DC Date:  02/14/2013   Anticipated DC Plan:  HOME W HOME HEALTH SERVICES      DC Planning Services  CM consult      [facility] Choice  HOME HEALTH   Choice offered to / List presented to:  C-4 Adult Children   DME arranged  Levan Hurst      DME agency  [facility]     HH arranged  HH-1 RN  HH-2 PT      [facility] agency  [facility]   Status of service:  Completed, signed off Medicare Important Message given?   (If response is "NO", the following Medicare IM given date fields will be blank) Date Medicare IM given:   Date Additional Medicare IM given:    Discharge Disposition:  HOME W HOME HEALTH SERVICES  Per UR Regulation:    If discussed at Long Length of Stay Meetings, dates discussed:    Comments:  02/14/13 15:00 CM spoke with adult child of patient, Abas Leicht [phone], who states she will be her father's caretaker at home.  She and the patient were given choice and they chose [facility] for HHPT/RN.  Faxed referral to [facility] for HHPT/RN.  Rolling walker to be delivered to room.  No other CM needs were communicated.  Freddy Jaksch, BSN, CM

## 2013-02-14 NOTE — ED Notes (Signed)
Admitting physician at bedside. Pt to be transported to floor once physician is done with exam.

## 2013-02-14 NOTE — H&P (Signed)
[facility] History and Physical  Gary Gallegos NFA:[id number] DOB: 06-Aug-1945 DOA: 02/13/2013  Referring physician: ER physician. PCP: Provider Not In System   Chief Complaint: Cough and shortness of breath.  HPI: Gary Gallegos is a 67 y.o. male with history of cardiomyopathy status post AICD placement, diabetes mellitus type 2, hyperlipidemia who had recently moved to [place] last month was traveling with his daughter in the car when suddenly he started having nausea and vomiting. Patient had multiple episodes of nausea vomiting in 15 minutes period. After which patient had persistent nonproductive cough with mild shortness of breath. Since the cough was not relieving patient came to the ER. In the ER patient also was found to be mildly wheezing. Chest x-ray showed nothing acute except for mild pleural effusion. Patient at this time is admitted for possible CHF exacerbation versus or and aspiration. Patient denies any chest pain fever chills abdominal pain diarrhea. Patient states his nausea vomiting has resolved. Patient otherwise denies any focal deficits.  Review of Systems: As presented in the history of presenting illness, rest negative.  Past Medical History  Diagnosis Date  . CHF (congestive heart failure)   . Diabetes mellitus without complication    Past Surgical History  Procedure Laterality Date  . Cardiac defibrillator placement     Social History:  reports that he has never smoked. He does not have any smokeless tobacco history on file. He reports that he does not drink alcohol or use illicit drugs. Home. where does patient live-- Can do ADLs. Can patient participate in ADLs?  Allergies  Allergen Reactions  . Penicillins Nausea And Vomiting  . Sulfa Antibiotics Nausea And Vomiting    History reviewed. No pertinent family history.    Prior to Admission medications   Medication Sig Start Date End Date Taking? Authorizing Provider  aspirin EC 81 MG tablet  Take 81 mg by mouth daily.   Yes Historical Provider, MD  carvedilol (COREG) 6.25 MG tablet Take 6.25 mg by mouth 2 (two) times daily with a meal.   Yes Historical Provider, MD  clopidogrel (PLAVIX) 75 MG tablet Take 75 mg by mouth daily.   Yes Historical Provider, MD  furosemide (LASIX) 40 MG tablet Take 40 mg by mouth 2 (two) times daily.   Yes Historical Provider, MD  simvastatin (ZOCOR) 5 MG tablet Take 5 mg by mouth at bedtime.   Yes Historical Provider, MD  zolpidem (AMBIEN) 5 MG tablet Take 5 mg by mouth at bedtime as needed for sleep.   Yes Historical Provider, MD   Physical Exam: Filed Vitals:   02/14/13 0045 02/14/13 0100 02/14/13 0115 02/14/13 0130  BP: 121/83 121/83 120/89 117/63  Pulse: 98 97 95 95  Temp:      TempSrc:      Resp: 25 26 26 24   SpO2: 93% 94% 92% 97%     General:  Well-developed and nourished.  Eyes: anicteric no pallor.  ENT: no discharge from the ears eyes nose mouth.  Neck: no mass felt. Mildly elevated JVD.  Cardiovascular: S1-S2 heard. Tachycardic.  Respiratory: expiratory wheeze and basal crepitations.  Abdomen: soft nontender bowel sounds present.  Skin: no rash.  Musculoskeletal: no edema.  Psychiatric: appears normal.  Neurologic: alert awake oriented to time place and person. Moves all extremities.  Labs on Admission:  Basic Metabolic Panel:  Recent Labs Lab 02/13/13 2346 02/14/13 0004  NA 139 143  K 3.5 3.4*  CL 102 104  CO2 24  --   GLUCOSE  158* 162*  BUN 13 11  CREATININE 0.82 0.90  CALCIUM 9.1  --    Liver Function Tests:  Recent Labs Lab 02/13/13 2346  AST 22  ALT 11  ALKPHOS 194*  BILITOT 1.9*  PROT 6.9  ALBUMIN 3.6   No results found for this basename: LIPASE, AMYLASE,  in the last 168 hours No results found for this basename: AMMONIA,  in the last 168 hours CBC:  Recent Labs Lab 02/13/13 2346 02/14/13 0004  WBC 8.2  --   NEUTROABS 6.3  --   HGB 16.0 17.7*  HCT 46.5 52.0  MCV 90.3  --   PLT  156  --    Cardiac Enzymes: No results found for this basename: CKTOTAL, CKMB, CKMBINDEX, TROPONINI,  in the last 168 hours  BNP (last 3 results)  Recent Labs  02/13/13 2346  PROBNP 7676.0*   CBG: No results found for this basename: GLUCAP,  in the last 168 hours  Radiological Exams on Admission: Dg Chest 2 View  02/14/2013   CLINICAL DATA:  Weakness.  EXAM: CHEST  2 VIEW  COMPARISON:  None.  FINDINGS: Left AICD is in place with single lead tip in the right ventricle. Cardiomegaly. No focal airspace opacity or edema. There is a small right pleural effusion. No pneumothorax. No acute bony abnormality.  IMPRESSION: Cardiomegaly. Small right pleural effusion.   Electronically Signed   By: Charlett Nose   On: 02/14/2013 00:43    EKG: Independently reviewed. Sinus tachycardia with nonspecific T-wave changes.  Assessment/Plan Active Problems:   Cough   Cardiomyopathy   Diabetes mellitus   HTN (hypertension)   Hyperlipidemia   1. Decompensated CHF versus and or aspiration - patient usually takes Lasix 40 mg twice a day. Patient's daughter states that she's not sure if he is compliant. She did get one dose of Lasix before coming to the ER orally. At this time I have ordered Lasix 40 mg IV every 12 along with nebulizer. Patient was given clindamycin which I will continue for now. Closely observe clinically. Patient's EF was not known as patient has just moved to [place]. 2. Nausea vomiting - presently abdomen exam is benign. Since patient has mildly elevated alkaline phosphatase and bilirubin we will check sonogram of the abdomen. 3. Diabetes mellitus type 2 - continue home medications Lantus with sliding-scale. 4. Hypertension - continue medications. 5. Hyperlipidemia - continue home medications.    Code Status: full code.  Family Communication: patient's family at the bedside.  Disposition Plan: admit to inpatient.    Gary Gallegos N. [facility] Pager  [phone].  If 7PM-7AM, please contact night-coverage [email or web address] Password [email or web address] 02/14/2013, 2:05 AM

## 2013-02-14 NOTE — Progress Notes (Signed)
Pt ambulated 250 ft in hallway. Pt steady with rolling walker. O2 saturation stayed at 97% on room air. Will continue to monitor.  Peter Congo RN

## 2013-02-14 NOTE — Evaluation (Signed)
Physical Therapy Evaluation Patient Details Name: Gary Gallegos MRN: [id number] DOB: January 23, 1946 Today's Date: 02/14/2013 Time: 4098-1191 PT Time Calculation (min): 37 min  PT Assessment / Plan / Recommendation History of Present Illness  Pt adm s/p multiple episodes of vomiting with coughing after episodes. PMHx CHF with cardiomyopathy, AICD, DM, and HTN  Clinical Impression  Pt admitted with nausea and vomiting. Pt reports h/o multiple falls related to lightheadedness/dizziness. Brief vestibular screen completed and symptoms not indicative of vestibular pathology. Sounds as though pt may be getting orthostatic at home (no dizziness today). Pt asked "Am I on a fluid pill at home?" RN made aware.   Pt currently with functional limitations due to the deficits listed below (see PT Problem List).  Pt will benefit from skilled PT to increase their independence and safety with mobility to allow discharge to the venue listed below.       PT Assessment  Patient needs continued PT services    Follow Up Recommendations  Home health PT;Supervision/Assistance - 24 hour    Does the patient have the potential to tolerate intense rehabilitation      Barriers to Discharge        Equipment Recommendations  Rolling walker with 5" wheels    Recommendations for Other Services OT consult   Frequency Min 3X/week    Precautions / Restrictions Precautions Precautions: Fall   Pertinent Vitals/Pain Denied pain; no SOB; 97% on RA      Mobility  Bed Mobility Bed Mobility: Not assessed Details for Bed Mobility Assistance: sitting EOB and denied difficulties Transfers Transfers: Sit to Stand;Stand to Sit Sit to Stand: 7: Independent;5: Supervision Stand to Sit: 7: Independent;5: Supervision Details for Transfer Assistance: independent with no device; vc/supervision for safe use of RW  Ambulation/Gait Ambulation/Gait Assistance: 4: Min guard Ambulation Distance (Feet): 100 Feet Assistive  device: Rolling walker Ambulation/Gait Assistance Details: vc for proper/safe use of RW; pt with tendency to push RW too far ahead despite frequent cues; vc and assist with 180 degree turn Gait Pattern: Step-through pattern;Decreased stride length;Trunk flexed Gait velocity: 0.74 ft/sec (<1.9 ft/sec indicative of high fall risk)    Exercises     PT Diagnosis: Difficulty walking  PT Problem List: Decreased balance;Decreased mobility;Decreased knowledge of use of DME PT Treatment Interventions: DME instruction;Gait training;Functional mobility training;Therapeutic activities;Balance training;Patient/family education     PT Goals(Current goals can be found in the care plan section) Acute Rehab PT Goals Patient Stated Goal: wants to return to his home in [place] (his son and family live with him with 24/7 care) PT Goal Formulation: With patient Time For Goal Achievement: 02/20/13 Potential to Achieve Goals: Good  Visit Information  Last PT Received On: 02/14/13 Assistance Needed: +1 History of Present Illness: Pt adm s/p multiple episodes of vomiting with coughing after episodes. PMHx CHF with cardiomyopathy, AICD, DM, and HTN       Prior Functioning  Home Living Family/patient expects to be discharged to:: Private residence Living Arrangements: Children Available Help at Discharge: Family;Available 24 hours/day (daughter) Type of Home: Apartment Home Access: Level entry Home Layout: One level Home Equipment: None Additional Comments: Daughter called and requesting 4wheel RW due to pt's frequent falls. Explained reason I would recommend 2wheeled RW and she understood. Pt also did not want 4wheeled RW. States she needs shower seat for tub/shower and agreed to HHPT for home safety eval. Prior Function Level of Independence: Independent (with frequent falls due to dizziness per pt ?orthostasis) Communication Communication: No difficulties  Cognition   Cognition Arousal/Alertness: Awake/alert Behavior During Therapy: WFL for tasks assessed/performed Overall Cognitive Status: Within Functional Limits for tasks assessed    Extremity/Trunk Assessment Upper Extremity Assessment Upper Extremity Assessment: Overall WFL for tasks assessed Lower Extremity Assessment Lower Extremity Assessment: Overall WFL for tasks assessed Cervical / Trunk Assessment Cervical / Trunk Assessment: Normal   Balance Balance Balance Assessed: Yes Static Sitting Balance Static Sitting - Balance Support: No upper extremity supported;Feet supported Static Sitting - Level of Assistance: 7: Independent Static Standing Balance Static Standing - Level of Assistance: 7: Independent Rhomberg - Eyes Opened: 30 Rhomberg - Eyes Closed: 0 (unable to keep feet together) High Level Balance High Level Balance Comments: turned 360 degrees with 16 steps (no device, no LOB) >12 steps indicates incr fall risk  End of Session PT - End of Session Equipment Utilized During Treatment: Gait belt Activity Tolerance: Patient tolerated treatment well Patient left: in chair;with call bell/phone within reach Nurse Communication: Mobility status;Other (comment) (pt unaware he was taking a "fluid pill"--need for education)  GP     Kadrian Partch 02/14/2013, 11:26 AM Pager [phone]

## 2013-02-16 ENCOUNTER — Encounter (HOSPITAL_COMMUNITY): Payer: Self-pay | Admitting: Nurse Practitioner

## 2013-02-16 ENCOUNTER — Emergency Department (HOSPITAL_COMMUNITY): Payer: Medicare Other

## 2013-02-16 ENCOUNTER — Emergency Department (HOSPITAL_COMMUNITY)
Admission: EM | Admit: 2013-02-16 | Discharge: 2013-02-16 | Disposition: A | Discharge status: Home / Self Care | Payer: Medicare Other | Attending: Emergency Medicine | Admitting: Emergency Medicine

## 2013-02-16 DIAGNOSIS — Z7982 Long term (current) use of aspirin: Secondary | ICD-10-CM | POA: Insufficient documentation

## 2013-02-16 DIAGNOSIS — Z79899 Other long term (current) drug therapy: Secondary | ICD-10-CM | POA: Insufficient documentation

## 2013-02-16 DIAGNOSIS — Z880 Allergy status to penicillin: Secondary | ICD-10-CM | POA: Insufficient documentation

## 2013-02-16 DIAGNOSIS — R12 Heartburn: Secondary | ICD-10-CM

## 2013-02-16 DIAGNOSIS — E119 Type 2 diabetes mellitus without complications: Secondary | ICD-10-CM | POA: Insufficient documentation

## 2013-02-16 DIAGNOSIS — K219 Gastro-esophageal reflux disease without esophagitis: Secondary | ICD-10-CM | POA: Insufficient documentation

## 2013-02-16 DIAGNOSIS — I509 Heart failure, unspecified: Secondary | ICD-10-CM | POA: Insufficient documentation

## 2013-02-16 DIAGNOSIS — Z794 Long term (current) use of insulin: Secondary | ICD-10-CM | POA: Insufficient documentation

## 2013-02-16 DIAGNOSIS — R079 Chest pain, unspecified: Secondary | ICD-10-CM

## 2013-02-16 LAB — POCT I-STAT TROPONIN I: Troponin i, poc: 0.01 ng/mL (ref 0.00–0.08)

## 2013-02-16 LAB — BASIC METABOLIC PANEL
CO2: 22 mEq/L (ref 19–32)
Calcium: 9.1 mg/dL (ref 8.4–10.5)
GFR calc non Af Amer: 89 mL/min — ABNORMAL LOW (ref >90)
Potassium: 4.2 mEq/L (ref 3.5–5.1)
Sodium: 135 mEq/L (ref 135–145)

## 2013-02-16 LAB — CBC
MCH: 31.4 pg (ref 26.0–34.0)
Platelets: 150 10*3/uL (ref 150–400)
RBC: 5.22 MIL/uL (ref 4.22–5.81)

## 2013-02-16 MED ORDER — GI COCKTAIL ~~LOC~~
30.0000 mL | Freq: Once | ORAL | Status: AC
  Administered 2013-02-16: 30 mL via ORAL
  Filled 2013-02-16: qty 30

## 2013-02-16 MED ORDER — PANTOPRAZOLE SODIUM 20 MG PO TBEC: 20.0000 mg | DELAYED_RELEASE_TABLET | Freq: Every day | ORAL | Status: DC

## 2013-02-16 NOTE — ED Provider Notes (Signed)
  Face-to-face evaluation   History: He complains of heartburn for several days. He is taking clindamycin for pneumonia. He has had sporadic diarrhea in the last 2 days.  Physical exam:Elderly, alert, cooperative. He is comfortable (1600), after treatment. Heart regular rhythm, no murmur. Lungs clear without wheezes, or rales. Abdomen is soft and nontender.  Evaluation is consistent with gastritis and/or reflux secondary to antibiotic treatment. He is stable for discharge with symptomatic treatment.    Medical screening examination/treatment/procedure(s) were conducted as a shared visit with non-physician practitioner(s) and myself.  I personally evaluated the patient during the encounter  Flint Melter, MD 02/16/13 [phone]

## 2013-02-16 NOTE — ED Provider Notes (Signed)
CSN: [id number]     Arrival date & time 02/16/13  1257 History   First MD Initiated Contact with Patient 02/16/13 1258     Chief Complaint  Patient presents with  . Chest Pain   (Consider location/radiation/quality/duration/timing/severity/associated sxs/prior Treatment) HPI Comments: 67 year old male with a past medical history of diabetes and CHF presents to the emergency department with his daughter complaining of sudden onset midsternal chest pain beginning around 10:30 this morning, 2 hours prior to arrival. Patient states he was eating a banana around 10:30 along with getting his antibiotics from his daughter, medially after began to develop midsternal chest pain. Pain described as burning, nonradiating rated 10 out of 10. He has not had any alleviating factors. Admits to associated nausea without vomiting. Patient was discharged from the hospital yesterday after being treated for pneumonia. He is on clindamycin for pneumonia. Patient states all of his symptoms from his hospital admission have since subsided. Denies shortness of breath, fever, chills, urinary or bowel changes. No history of heartburn. Daughter states that last night he ate pork and mashed potatoes without any problem, however he is not using his dentures and is not chewing his food completely before he swallows.  The history is provided by the patient and a relative.    Past Medical History  Diagnosis Date  . CHF (congestive heart failure)   . Diabetes mellitus without complication    Past Surgical History  Procedure Laterality Date  . Cardiac defibrillator placement     History reviewed. No pertinent family history. History  Substance Use Topics  . Smoking status: Never Smoker   . Smokeless tobacco: Not on file  . Alcohol Use: No    Review of Systems  Constitutional: Negative for fever, chills, activity change and appetite change.  Respiratory: Negative for shortness of breath.   Cardiovascular: Positive for  chest pain.  Gastrointestinal: Positive for nausea. Negative for vomiting, abdominal pain, diarrhea and constipation.  Musculoskeletal: Negative for back pain.  Neurological: Negative for dizziness, syncope, weakness, light-headedness and headaches.  Psychiatric/Behavioral: Negative for confusion.  All other systems reviewed and are negative.    Allergies  Penicillins and Sulfa antibiotics  Home Medications   Current Outpatient Rx  Name  Route  Sig  Dispense  Refill  . aspirin EC 81 MG tablet   Oral   Take 81 mg by mouth daily.         . carvedilol (COREG) 6.25 MG tablet   Oral   Take 6.25 mg by mouth 2 (two) times daily with a meal.         . clindamycin (CLEOCIN) 300 MG capsule   Oral   Take 1 capsule (300 mg total) by mouth 3 (three) times daily.   12 capsule   0   . clopidogrel (PLAVIX) 75 MG tablet   Oral   Take 75 mg by mouth daily.         . furosemide (LASIX) 40 MG tablet   Oral   Take 40 mg by mouth 2 (two) times daily.         . insulin glargine (LANTUS) 100 UNIT/ML injection   Subcutaneous   Inject 0.25 mLs (25 Units total) into the skin at bedtime.   10 mL   12   . ondansetron (ZOFRAN) 4 MG tablet   Oral   Take 1 tablet (4 mg total) by mouth every 6 (six) hours as needed for nausea.   20 tablet   0   .  simvastatin (ZOCOR) 5 MG tablet   Oral   Take 5 mg by mouth at bedtime.         Marland Kitchen zolpidem (AMBIEN) 5 MG tablet   Oral   Take 5 mg by mouth at bedtime as needed for sleep.          There were no vitals taken for this visit. Physical Exam  Nursing note and vitals reviewed. Constitutional: He is oriented to person, place, and time. He appears well-developed and well-nourished. No distress.  HENT:  Head: Normocephalic and atraumatic.  Mouth/Throat: Oropharynx is clear and moist.  Eyes: Conjunctivae and EOM are normal. Pupils are equal, round, and reactive to light.  Neck: Normal range of motion. Neck supple.  Cardiovascular:  Normal rate, regular rhythm and normal heart sounds.   Pulmonary/Chest: Effort normal and breath sounds normal. No respiratory distress. He has no decreased breath sounds. He has no wheezes. He has no rhonchi. He has no rales.  Abdominal: Soft. Normal appearance and bowel sounds are normal. He exhibits no distension and no mass. There is tenderness in the epigastric area. There is no rigidity, no rebound and no guarding.  No peritoneal signs.  Musculoskeletal: Normal range of motion. He exhibits no edema.  Neurological: He is alert and oriented to person, place, and time. He has normal strength. No sensory deficit.  Skin: Skin is warm and dry. He is not diaphoretic.  Psychiatric: He has a normal mood and affect. His behavior is normal.    ED Course  Procedures (including critical care time) Labs Review Labs Reviewed  CBC - Abnormal; Notable for the following:    RDW 15.9 (*)    All other components within normal limits  BASIC METABOLIC PANEL - Abnormal; Notable for the following:    Glucose, Bld 220 (*)    GFR calc non Af Amer 89 (*)    All other components within normal limits  POCT I-STAT TROPONIN I    Date: 02/16/2013  Rate: 87  Rhythm: normal sinus rhythm  QRS Axis: normal  Intervals: QT prolonged borderline  ST/T Wave abnormalities: nonspecific T wave changes laterally  Conduction Disutrbances:none  Narrative Interpretation: no stemi, no significant changes since EKG obtained on 02/13/1013  Old EKG Reviewed: unchanged   Imaging Review Dg Chest 2 View  02/16/2013   *RADIOLOGY REPORT*  Clinical Data: Chest pain.  Epigastric burning.  CHEST - 2 VIEW  Comparison: 02/14/2013.  Findings: Marked enlargement of the cardiopericardial silhouette. Left subclavian AICD. The cardiac enlargement appears slightly more pronounced however this is projectional on today's examination. There is no pleural effusion.  No pulmonary edema.  Slightly increased prominence of the fissures on the lateral  view is probably projectional.  IMPRESSION: Cardiomegaly without failure.  Unchanged left subclavian AICD.   Original Report Authenticated By: Andreas Newport, M.D.    MDM   1. Heartburn   2. Chest pain      Patient with mid-epigastric pain and tenderness. He is in NAD, normal VS. Most-likely GI related symptoms. Will give GI cocktail and re-assess. Given patient's history and recent admission, workup pending- cbc, bmp, troponin, cxr. EKG without any acute changes. Case discussed with my attending Dr. Effie Shy who agrees with plan of care. 2:47 PM Patient reports pain 6/10 from 10/10 after receiving GI cocktail.   4:02 PM Workup unremarkable. Pain 4/10. Stable for discharge, rx for protonix, return precautions discussed. Patient states understanding of treatment care plan and is agreeable.   Trevor Mace, PA-C  02/16/13 1602 

## 2013-02-16 NOTE — ED Notes (Addendum)
Per ems: pt c/o epigastric burning pain since eating late last night. Pt was discharged yesterday from here for N/v/d. Daughter gave zofran with no relief. En route, VSS, A&Ox4.

## 2013-02-16 NOTE — ED Notes (Signed)
Ed rn and ed emt unable to obtain labs, phlebotomy is aware they will come draw labs

## 2013-02-20 LAB — CULTURE, BLOOD (ROUTINE X 2)
Culture: NO GROWTH
Culture: NO GROWTH

## 2013-02-26 ENCOUNTER — Ambulatory Visit (INDEPENDENT_AMBULATORY_CARE_PROVIDER_SITE_OTHER): Payer: Medicare Other | Admitting: Family

## 2013-02-26 ENCOUNTER — Encounter: Payer: Self-pay | Admitting: Family

## 2013-02-26 VITALS — BP 108/60 | HR 69 | Ht 69.5 in | Wt 172.0 lb

## 2013-02-26 DIAGNOSIS — I10 Essential (primary) hypertension: Secondary | ICD-10-CM

## 2013-02-26 DIAGNOSIS — E1140 Type 2 diabetes mellitus with diabetic neuropathy, unspecified: Secondary | ICD-10-CM

## 2013-02-26 DIAGNOSIS — K219 Gastro-esophageal reflux disease without esophagitis: Secondary | ICD-10-CM

## 2013-02-26 DIAGNOSIS — E1149 Type 2 diabetes mellitus with other diabetic neurological complication: Secondary | ICD-10-CM

## 2013-02-26 DIAGNOSIS — I428 Other cardiomyopathies: Secondary | ICD-10-CM

## 2013-02-26 DIAGNOSIS — F329 Major depressive disorder, single episode, unspecified: Secondary | ICD-10-CM

## 2013-02-26 DIAGNOSIS — E1142 Type 2 diabetes mellitus with diabetic polyneuropathy: Secondary | ICD-10-CM

## 2013-02-26 DIAGNOSIS — R413 Other amnesia: Secondary | ICD-10-CM

## 2013-02-26 LAB — HEMOGLOBIN A1C: Hgb A1c MFr Bld: 11 % — ABNORMAL HIGH (ref 4.6–6.5)

## 2013-02-26 LAB — POCT URINALYSIS DIPSTICK
Blood, UA: NEGATIVE
Glucose, UA: NEGATIVE
Spec Grav, UA: 1.02
pH, UA: 6

## 2013-02-26 LAB — BASIC METABOLIC PANEL
BUN: 8 mg/dL (ref 6–23)
CO2: 29 mEq/L (ref 19–32)
Chloride: 106 mEq/L (ref 96–112)
Creatinine, Ser: 0.8 mg/dL (ref 0.4–1.5)
Potassium: 3.5 mEq/L (ref 3.5–5.1)

## 2013-02-26 LAB — CBC WITH DIFFERENTIAL/PLATELET
MCHC: 32.7 g/dL (ref 30.0–36.0)
MCV: 92.8 fl (ref 78.0–100.0)
Monocytes Absolute: 0.5 10*3/uL (ref 0.1–1.0)
Neutrophils Relative %: 70.5 % (ref 43.0–77.0)
Platelets: 148 10*3/uL — ABNORMAL LOW (ref 150.0–400.0)

## 2013-02-26 LAB — HEPATIC FUNCTION PANEL
ALT: 9 U/L (ref 0–53)
AST: 19 U/L (ref 0–37)
Total Protein: 6.4 g/dL (ref 6.0–8.3)

## 2013-02-26 MED ORDER — PANTOPRAZOLE SODIUM 20 MG PO TBEC: 20.0000 mg | DELAYED_RELEASE_TABLET | Freq: Every day | ORAL | Status: DC

## 2013-02-26 MED ORDER — SERTRALINE HCL 50 MG PO TABS: 50.0000 mg | ORAL_TABLET | Freq: Every day | ORAL | Status: DC

## 2013-02-26 NOTE — Patient Instructions (Addendum)
If fasting blood sugar is greater than 120 for 3 consecutive mornings add 2 unit of Lantus at bedtime.  Depression, Adult Depression refers to feeling sad, low, down in the dumps, blue, gloomy, or empty. In general, there are two kinds of depression: 1. Depression that we all experience from time to time because of upsetting life experiences, including the loss of a job or the ending of a relationship (normal sadness or normal grief). This kind of depression is considered normal, is short lived, and resolves within a few days to 2 weeks. (Depression experienced after the loss of a loved one is called bereavement. Bereavement often lasts longer than 2 weeks but normally gets better with time.) 2. Clinical depression, which lasts longer than normal sadness or normal grief or interferes with your ability to function at home, at work, and in school. It also interferes with your personal relationships. It affects almost every aspect of your life. Clinical depression is an illness. Symptoms of depression also can be caused by conditions other than normal sadness and grief or clinical depression. Examples of these conditions are listed as follows:  Physical illness Some physical illnesses, including underactive thyroid gland (hypothyroidism), severe anemia, specific types of cancer, diabetes, uncontrolled seizures, heart and lung problems, strokes, and chronic pain are commonly associated with symptoms of depression.  Side effects of some prescription medicine In some people, certain types of prescription medicine can cause symptoms of depression.  Substance abuse Abuse of alcohol and illicit drugs can cause symptoms of depression. SYMPTOMS Symptoms of normal sadness and normal grief include the following:  Feeling sad or crying for short periods of time.  Not caring about anything (apathy).  Difficulty sleeping or sleeping too much.  No longer able to enjoy the things you used to enjoy.  Desire to  be by oneself all the time (social isolation).  Lack of energy or motivation.  Difficulty concentrating or remembering.  Change in appetite or weight.  Restlessness or agitation. Symptoms of clinical depression include the same symptoms of normal sadness or normal grief and also the following symptoms:  Feeling sad or crying all the time.  Feelings of guilt or worthlessness.  Feelings of hopelessness or helplessness.  Thoughts of suicide or the desire to harm yourself (suicidal ideation).  Loss of touch with reality (psychotic symptoms). Seeing or hearing things that are not real (hallucinations) or having false beliefs about your life or the people around you (delusions and paranoia). DIAGNOSIS  The diagnosis of clinical depression usually is based on the severity and duration of the symptoms. Your caregiver also will ask you questions about your medical history and substance use to find out if physical illness, use of prescription medicine, or substance abuse is causing your depression. Your caregiver also may order blood tests. TREATMENT  Typically, normal sadness and normal grief do not require treatment. However, sometimes antidepressant medicine is prescribed for bereavement to ease the depressive symptoms until they resolve. The treatment for clinical depression depends on the severity of your symptoms but typically includes antidepressant medicine, counseling with a mental health professional, or a combination of both. Your caregiver will help to determine what treatment is best for you. Depression caused by physical illness usually goes away with appropriate medical treatment of the illness. If prescription medicine is causing depression, talk with your caregiver about stopping the medicine, decreasing the dose, or substituting another medicine. Depression caused by abuse of alcohol or illicit drugs abuse goes away with abstinence from these substances.  Some adults need  professional help in order to stop drinking or using drugs. SEEK IMMEDIATE CARE IF:  You have thoughts about hurting yourself or others.  You lose touch with reality (have psychotic symptoms).  You are taking medicine for depression and have a serious side effect. FOR MORE INFORMATION National Alliance on Mental Illness: www.nami.[employer]: http://www.maynard.net/ Document Released: 06/01/2000 Document Revised: 12/04/2011 Document Reviewed: 09/03/2011 [facility] Patient Information 2014 [place], [place].

## 2013-02-26 NOTE — Progress Notes (Signed)
Subjective:    Patient ID: Gary Gallegos, male    DOB: [birth year], 67 y.o.   MRN: [id number]  HPI 67 year old AAM, nonsmoker is in as a new patient to be established, Depression, Hypertension, Hypercholesterolemia, Cardiomyopathy, Type 2 Diabetes. Has a implanted defibrillator. No currently being manage by cardiology as he recently relocated here from [place], [place]. In wife recently passed away, now month ago. His daughter is concerned that he had a difficult time coping with her death. She reports it appears that his health began to decline rapidly after her death. He has been seen in the emergency department twice with chest pain and shortness of breath most recently on 02/16/2013. He was given a GI cocktail and symptoms resolved. Daughter reports that she's been given 10 units of Lantus at bedtime. Blood sugars between 150 and 170. He was originally prescribed 26 units. She believes that was too much.  Review of Systems  Constitutional: Negative.   HENT: Negative.   Respiratory: Negative.   Cardiovascular: Negative.   Gastrointestinal: Negative.   Endocrine: Negative.   Genitourinary: Negative.   Musculoskeletal: Negative.   Skin: Negative.   Neurological: Negative.   Hematological: Negative.   Psychiatric/Behavioral: Negative.    Past Medical History  Diagnosis Date  . CHF (congestive heart failure)   . Diabetes mellitus without complication     History   Social History  . Marital Status: Widowed    Spouse Name: N/A    Number of Children: N/A  . Years of Education: N/A   Occupational History  . Not on file.   Social History Main Topics  . Smoking status: Never Smoker   . Smokeless tobacco: Not on file  . Alcohol Use: No  . Drug Use: No  . Sexual Activity: Not on file   Other Topics Concern  . Not on file   Social History Narrative  . No narrative on file    Past Surgical History  Procedure Laterality Date  . Cardiac defibrillator placement      No  family history on file.  Allergies  Allergen Reactions  . Penicillins Nausea And Vomiting  . Sulfa Antibiotics Nausea And Vomiting    Current Outpatient Prescriptions on File Prior to Visit  Medication Sig Dispense Refill  . aspirin EC 81 MG tablet Take 81 mg by mouth daily.      . carvedilol (COREG) 6.25 MG tablet Take 6.25 mg by mouth 2 (two) times daily with a meal.      . clindamycin (CLEOCIN) 300 MG capsule Take 1 capsule (300 mg total) by mouth 3 (three) times daily.  12 capsule  0  . clopidogrel (PLAVIX) 75 MG tablet Take 75 mg by mouth daily.      . furosemide (LASIX) 40 MG tablet Take 40 mg by mouth 2 (two) times daily.      . insulin glargine (LANTUS) 100 UNIT/ML injection Inject 0.25 mLs (25 Units total) into the skin at bedtime.  10 mL  12  . ondansetron (ZOFRAN) 4 MG tablet Take 1 tablet (4 mg total) by mouth every 6 (six) hours as needed for nausea.  20 tablet  0  . simvastatin (ZOCOR) 5 MG tablet Take 5 mg by mouth at bedtime.      Marland Kitchen zolpidem (AMBIEN) 5 MG tablet Take 5 mg by mouth at bedtime as needed for sleep.       No current facility-administered medications on file prior to visit.    BP 108/60  Pulse 69  Ht 5' 9.5" (1.765 m)  Wt 172 lb (78.019 kg)  BMI 25.04 kg/m2chart    Objective:   Physical Exam  Constitutional: He is oriented to person, place, and time. He appears well-developed and well-nourished.  HENT:  Right Ear: External ear normal.  Left Ear: External ear normal.  Nose: Nose normal.  Mouth/Throat: Oropharynx is clear and moist.  Neck: Normal range of motion. Neck supple. No thyromegaly present.  Cardiovascular: Normal rate, regular rhythm and normal heart sounds.   Pulmonary/Chest: Effort normal and breath sounds normal.  Abdominal: Soft. Bowel sounds are normal.  Musculoskeletal: Normal range of motion.  Neurological: He is alert and oriented to person, place, and time.  Skin: Skin is warm and dry.  Psychiatric: He has a normal mood and  affect.          Assessment & Plan:  Assessment: 1. Type 2 Diabetes 2. Hypertension 3. Hypercholesterolemia 4. Cardiomyopathy 5. Depression   Plan: Refer to Cardiologist. Labs sent. Start Zoloft 50 mg once daily. Renewed protonix. Patient to call the office with questions or concerns. Recheck as scheduled;ed and as needed.

## 2013-03-01 ENCOUNTER — Encounter: Payer: Self-pay | Admitting: Family

## 2013-03-02 ENCOUNTER — Other Ambulatory Visit: Payer: Self-pay | Admitting: Internal Medicine

## 2013-03-05 ENCOUNTER — Telehealth: Payer: Self-pay

## 2013-03-05 NOTE — Telephone Encounter (Signed)
Spoke with pt's daughter, [name], on the phone. Advised her that she will need to complete the adult proxy form for MyChart communication the next time they are in the office.   Discussed labs in depth and explained that hemoglobin is blood sugar over a 3 month period and with the close monitoring and the increase in insulin to 14 units, hopefully a1c will be lower at 3 month recheck. Faith verbalized understanding and will call with questions or concerns

## 2013-03-08 ENCOUNTER — Other Ambulatory Visit: Payer: Self-pay | Admitting: Internal Medicine

## 2013-03-13 ENCOUNTER — Telehealth: Payer: Self-pay | Admitting: Family

## 2013-03-13 NOTE — Telephone Encounter (Signed)
Pt scheduled for Monday 03/16/13 at 1:45pm.

## 2013-03-13 NOTE — Telephone Encounter (Signed)
RN called to report that EMS was there last Tuesday due to his sugar being 38. They treated him and left. He also fell last week, not sure of a date, but he fell and hit the right side of his head. There's no bruising or mark. His BS has been running between 180-265, other than that one time. She states that they're not checking them before meals like they where instructed to. The daughter is also adjusting the Lantus at her own convenience. FYI.

## 2013-03-13 NOTE — Telephone Encounter (Signed)
Needs OV.  

## 2013-03-16 ENCOUNTER — Ambulatory Visit (INDEPENDENT_AMBULATORY_CARE_PROVIDER_SITE_OTHER): Payer: Medicare Other | Admitting: Family

## 2013-03-16 ENCOUNTER — Encounter: Payer: Self-pay | Admitting: Family

## 2013-03-16 VITALS — BP 114/72 | HR 100 | Wt 182.0 lb

## 2013-03-16 DIAGNOSIS — E119 Type 2 diabetes mellitus without complications: Secondary | ICD-10-CM

## 2013-03-16 DIAGNOSIS — R609 Edema, unspecified: Secondary | ICD-10-CM

## 2013-03-16 DIAGNOSIS — F329 Major depressive disorder, single episode, unspecified: Secondary | ICD-10-CM

## 2013-03-16 DIAGNOSIS — F3289 Other specified depressive episodes: Secondary | ICD-10-CM

## 2013-03-16 DIAGNOSIS — I10 Essential (primary) hypertension: Secondary | ICD-10-CM

## 2013-03-16 MED ORDER — METOLAZONE 5 MG PO TABS: 5.0000 mg | ORAL_TABLET | Freq: Every day | ORAL | Status: DC | PRN

## 2013-03-16 MED ORDER — METOLAZONE 5 MG PO TABS: 5.0000 mg | ORAL_TABLET | ORAL | Status: DC | PRN

## 2013-03-16 MED ORDER — PANTOPRAZOLE SODIUM 20 MG PO TBEC: 20.0000 mg | DELAYED_RELEASE_TABLET | Freq: Every day | ORAL | Status: DC

## 2013-03-16 NOTE — Progress Notes (Signed)
Subjective:    Patient ID: Gary Gallegos, male    DOB: May 12, 1946, 67 y.o.   MRN: [id number]  HPI  67 year old [place] American male, is in today for recheck of type 2 diabetes. His last office visit his hemoglobin A1c was down to 11.0. At that time, we increased his Lantus to 14 units. His daughter reports she has since changed his diet which caused him to have a hypoglycemic episode and she has now decreased his Lantus to 40 units at bedtime. Reports him having blood sugars between 90s and low 100s. He continues to have some swelling in his lower extremities that comes and goes. He takes Lasix 60 mg daily. He has concerns of urinary frequency in the evening. Daughter has been giving him on Lasix at that time. He is up 10 pounds this visit. He is due to have surgery on his eyes to help improve his vision from cataracts. He was also started on Zoloft 50 mg once a day for depression. His daughter reports that his mood is much better at this point. Patient admits he's better 2.  Review of Systems  Constitutional: Negative.   HENT: Negative.   Respiratory: Negative.   Cardiovascular: Positive for leg swelling. Negative for chest pain and palpitations.  Gastrointestinal: Negative.   Endocrine: Negative.   Genitourinary: Negative.   Musculoskeletal: Negative.   Skin: Negative.   Allergic/Immunologic: Negative.   Neurological: Negative.   Psychiatric/Behavioral: Negative.    Past Medical History  Diagnosis Date  . CHF (congestive heart failure)   . Diabetes mellitus without complication     History   Social History  . Marital Status: Widowed    Spouse Name: N/A    Number of Children: N/A  . Years of Education: N/A   Occupational History  . Not on file.   Social History Main Topics  . Smoking status: Never Smoker   . Smokeless tobacco: Not on file  . Alcohol Use: No  . Drug Use: No  . Sexual Activity: Not on file   Other Topics Concern  . Not on file   Social History  Narrative  . No narrative on file    Past Surgical History  Procedure Laterality Date  . Cardiac defibrillator placement      No family history on file.  Allergies  Allergen Reactions  . Penicillins Nausea And Vomiting  . Sulfa Antibiotics Nausea And Vomiting    Current Outpatient Prescriptions on File Prior to Visit  Medication Sig Dispense Refill  . aspirin EC 81 MG tablet Take 81 mg by mouth daily.      . carvedilol (COREG) 6.25 MG tablet Take 6.25 mg by mouth 2 (two) times daily with a meal.      . clindamycin (CLEOCIN) 300 MG capsule Take 1 capsule (300 mg total) by mouth 3 (three) times daily.  12 capsule  0  . clopidogrel (PLAVIX) 75 MG tablet Take 75 mg by mouth daily.      . furosemide (LASIX) 40 MG tablet Take 40 mg by mouth 2 (two) times daily.      . insulin glargine (LANTUS) 100 UNIT/ML injection Inject 0.25 mLs (25 Units total) into the skin at bedtime.  10 mL  12  . ondansetron (ZOFRAN) 4 MG tablet Take 1 tablet (4 mg total) by mouth every 6 (six) hours as needed for nausea.  20 tablet  0  . sertraline (ZOLOFT) 50 MG tablet Take 1 tablet (50 mg total) by mouth daily.  30  tablet  3  . simvastatin (ZOCOR) 5 MG tablet Take 5 mg by mouth at bedtime.      Marland Kitchen zolpidem (AMBIEN) 5 MG tablet Take 5 mg by mouth at bedtime as needed for sleep.       No current facility-administered medications on file prior to visit.    BP 114/72  Pulse 100  Wt 182 lb (82.555 kg)  BMI 26.5 kg/m2chart    Objective:   Physical Exam  Constitutional: He is oriented to person, place, and time. He appears well-developed and well-nourished.  HENT:  Right Ear: External ear normal.  Left Ear: External ear normal.  Nose: Nose normal.  Mouth/Throat: Oropharynx is clear and moist.  Neck: Normal range of motion. Neck supple.  Cardiovascular: Normal rate, regular rhythm and normal heart sounds.   Pulmonary/Chest: Effort normal and breath sounds normal.  Abdominal: Soft. Bowel sounds are normal.   Musculoskeletal: He exhibits edema. He exhibits no tenderness.  Neurological: He is alert and oriented to person, place, and time.  Skin: Skin is warm and dry.  Psychiatric: He has a normal mood and affect.          Assessment & Plan:  Assessment: 1. Type 2 diabetes-uncontrolled 2. Hypertension 3. Peripheral edema 4. Depression  Plan: Advised to continue 4 units of Lantus at bedtime and increase by 2 units for fasting blood sugar greater than 120 for 3 consecutive days. At metolazone 5 mg as needed for peripheral edema. Continue Lasix. However, do not give Lasix at bedtime. Low sodium diet. Continue Zoloft. Recheck in 2 months and sooner as needed.

## 2013-03-16 NOTE — Addendum Note (Signed)
Addended by: Beverely Low on: 03/16/2013 03:21 PM   Modules accepted: Orders

## 2013-03-16 NOTE — Patient Instructions (Addendum)

## 2013-03-18 ENCOUNTER — Telehealth: Payer: Self-pay | Admitting: Family

## 2013-03-18 DIAGNOSIS — I428 Other cardiomyopathies: Secondary | ICD-10-CM

## 2013-03-18 NOTE — Telephone Encounter (Signed)
Pt daughter is call and requesting tamesha to return her call concerning dad needing cardiologist.

## 2013-03-18 NOTE — Telephone Encounter (Signed)
done

## 2013-03-18 NOTE — Telephone Encounter (Signed)
Pt needs referral to cardiology because surgery was canceled due to him not having seen a cardiologist in the last 30 days. They want to be sure his DEFIBRILLATOR is working properly

## 2013-03-20 ENCOUNTER — Telehealth: Payer: Self-pay | Admitting: Family

## 2013-03-20 NOTE — Telephone Encounter (Signed)
She is requesting to continue home health for 4 more weeks, twice a week. Please advise.

## 2013-03-20 NOTE — Telephone Encounter (Signed)
Gary Gallegos aware ok to continue Home healthcare. Orders to be faxed to office

## 2013-03-24 ENCOUNTER — Encounter: Payer: Self-pay | Admitting: Family

## 2013-03-26 ENCOUNTER — Ambulatory Visit: Payer: Medicare Other | Admitting: Family

## 2013-03-27 ENCOUNTER — Telehealth: Payer: Self-pay | Admitting: Family

## 2013-03-27 NOTE — Telephone Encounter (Signed)
PT states that the pt has fallen twice in the last week. She states that the pt has fallen twice again since her last phone call 03/13/13. FYI.

## 2013-03-27 NOTE — Telephone Encounter (Signed)
Pt has appointment scheduled for Monday

## 2013-03-30 ENCOUNTER — Encounter: Payer: Self-pay | Admitting: Family

## 2013-03-30 ENCOUNTER — Ambulatory Visit (INDEPENDENT_AMBULATORY_CARE_PROVIDER_SITE_OTHER): Payer: Medicare Other | Admitting: Family

## 2013-03-30 VITALS — BP 118/74 | HR 112 | Wt 155.0 lb

## 2013-03-30 DIAGNOSIS — Z9181 History of falling: Secondary | ICD-10-CM

## 2013-03-30 DIAGNOSIS — H6120 Impacted cerumen, unspecified ear: Secondary | ICD-10-CM

## 2013-03-30 DIAGNOSIS — H6123 Impacted cerumen, bilateral: Secondary | ICD-10-CM

## 2013-03-30 DIAGNOSIS — R609 Edema, unspecified: Secondary | ICD-10-CM

## 2013-03-30 DIAGNOSIS — E119 Type 2 diabetes mellitus without complications: Secondary | ICD-10-CM

## 2013-03-30 DIAGNOSIS — Z23 Encounter for immunization: Secondary | ICD-10-CM

## 2013-03-31 NOTE — Progress Notes (Signed)
Subjective:    Patient ID: Gary Gallegos, male    DOB: 1946/04/04, 67 y.o.   MRN: [id number]  HPI 67 year old African American male, nonsmoker is in for recheck of hypertension, hyperlipidemia, type 2 diabetes, cardiomyopathy, peripheral edema. At his last office visit we added as needed metolazone  for peripheral edema that has worked very well. His daughter reports his blood sugars are coming down next fasting have been 126. Postprandial readings around 200. Patient reports he's been more fatigued since going through physical therapy. He ambulates with a walker currently. At times, he feels like he needs further assistance due to his feeling tired. He still is a typically after he had physical therapy.   Review of Systems  Constitutional: Positive for fatigue. Negative for fever and appetite change.  Respiratory: Negative.   Cardiovascular: Negative.   Gastrointestinal: Negative.   Endocrine: Negative.   Genitourinary: Negative.   Musculoskeletal: Positive for arthralgias and gait problem. Negative for myalgias.       Ambulating with walker  Skin: Negative.   Neurological: Positive for weakness. Negative for dizziness and light-headedness.  Hematological: Negative.   Psychiatric/Behavioral: Negative.    Past Medical History  Diagnosis Date  . CHF (congestive heart failure)   . Diabetes mellitus without complication     History   Social History  . Marital Status: Widowed    Spouse Name: N/A    Number of Children: N/A  . Years of Education: N/A   Occupational History  . Not on file.   Social History Main Topics  . Smoking status: Never Smoker   . Smokeless tobacco: Not on file  . Alcohol Use: No  . Drug Use: No  . Sexual Activity: Not on file   Other Topics Concern  . Not on file   Social History Narrative  . No narrative on file    Past Surgical History  Procedure Laterality Date  . Cardiac defibrillator placement      No family history on  file.  Allergies  Allergen Reactions  . Penicillins Nausea And Vomiting  . Sulfa Antibiotics Nausea And Vomiting    Current Outpatient Prescriptions on File Prior to Visit  Medication Sig Dispense Refill  . aspirin EC 81 MG tablet Take 81 mg by mouth daily.      . carvedilol (COREG) 6.25 MG tablet Take 6.25 mg by mouth 2 (two) times daily with a meal.      . clindamycin (CLEOCIN) 300 MG capsule Take 1 capsule (300 mg total) by mouth 3 (three) times daily.  12 capsule  0  . clopidogrel (PLAVIX) 75 MG tablet Take 75 mg by mouth daily.      . furosemide (LASIX) 40 MG tablet Take 40 mg by mouth 2 (two) times daily.      . insulin glargine (LANTUS) 100 UNIT/ML injection Inject 0.25 mLs (25 Units total) into the skin at bedtime.  10 mL  12  . metolazone (ZAROXOLYN) 5 MG tablet Take 1 tablet (5 mg total) by mouth daily as needed.  30 tablet  1  . ondansetron (ZOFRAN) 4 MG tablet Take 1 tablet (4 mg total) by mouth every 6 (six) hours as needed for nausea.  20 tablet  0  . pantoprazole (PROTONIX) 20 MG tablet Take 1 tablet (20 mg total) by mouth daily.  30 tablet  5  . sertraline (ZOLOFT) 50 MG tablet Take 1 tablet (50 mg total) by mouth daily.  30 tablet  3  . simvastatin (ZOCOR) 5  MG tablet Take 5 mg by mouth at bedtime.      Marland Kitchen zolpidem (AMBIEN) 5 MG tablet Take 5 mg by mouth at bedtime as needed for sleep.       No current facility-administered medications on file prior to visit.    BP 118/74  Pulse 112  Wt 155 lb (70.308 kg)  BMI 22.57 kg/m2chart    Objective:   Physical Exam  Constitutional: He is oriented to person, place, and time. He appears well-developed and well-nourished.  HENT:  Right Ear: External ear normal.  Left Ear: External ear normal.  Nose: Nose normal.  Mouth/Throat: Oropharynx is clear and moist.  Neck: Normal range of motion. Neck supple.  Cardiovascular: Normal rate, regular rhythm and normal heart sounds.   Pulmonary/Chest: Effort normal and breath sounds  normal.  Abdominal: Soft. Bowel sounds are normal.  Musculoskeletal: Normal range of motion. He exhibits no edema.  Neurological: He is alert and oriented to person, place, and time.  Skin: Skin is warm and dry.  Psychiatric: He has a normal mood and affect.          Assessment & Plan:  Assessment: 1. Type 2 diabetes 2. Hypertension 3. Peripheral edema-improving 4. Fatigue  Plan: Continue current medication regimen. Only use metolazone as needed. Recheck in 3 months and sooner as needed. I do not advise, arousable chair today as I do not want him to come dependent on a wheelchair at this point. I have prescribed a walker with a seat rest.

## 2013-04-06 ENCOUNTER — Other Ambulatory Visit: Payer: Self-pay | Admitting: Family

## 2013-04-06 DIAGNOSIS — L602 Onychogryphosis: Secondary | ICD-10-CM

## 2013-04-10 ENCOUNTER — Encounter: Payer: Self-pay | Admitting: Cardiology

## 2013-04-10 ENCOUNTER — Ambulatory Visit (INDEPENDENT_AMBULATORY_CARE_PROVIDER_SITE_OTHER): Payer: Medicare Other | Admitting: Cardiology

## 2013-04-10 VITALS — BP 110/90 | HR 86 | Ht 69.5 in | Wt 161.0 lb

## 2013-04-10 DIAGNOSIS — E785 Hyperlipidemia, unspecified: Secondary | ICD-10-CM

## 2013-04-10 DIAGNOSIS — I255 Ischemic cardiomyopathy: Secondary | ICD-10-CM

## 2013-04-10 DIAGNOSIS — I2589 Other forms of chronic ischemic heart disease: Secondary | ICD-10-CM

## 2013-04-10 NOTE — Progress Notes (Signed)
Patient ID: Winferd Wease, male   DOB: 1946-02-08, 67 y.o.   MRN: [id number]    Patient Name: Zayvon Alicea Date of Encounter: 04/10/2013  Primary Care Provider:  Janell Quiet, FNP Primary Cardiologist:  Tobias Alexander, H  Patient Profile  Cardiomyopathy  Problem List   Past Medical History  Diagnosis Date  . CHF (congestive heart failure)   . Diabetes mellitus without complication    Past Surgical History  Procedure Laterality Date  . Cardiac defibrillator placement      Allergies  Allergies  Allergen Reactions  . Penicillins Nausea And Vomiting  . Sulfa Antibiotics Nausea And Vomiting    HPI  67 year old male with a past medical history of diabetes and CHF who is coming to our clinic to establish care. The patient recently moved to [place].  The patient was diagnosed with congestive heart failure or approximately one year ago, per his daughter he had a normal cath and was started on medical therapy. He also had an ICD implanted.  The patient walks with a walker he is minimally active, he states the reason he is that he gets claudication after walking about a half a mile, but he denies shortness of breath or chest pain. He denies any orthopnea or paroxysmal nocturnal dyspnea, he still there states that he has had frequent falls because of his improved vision secondary to diabetes, he denies any palpitations.   Home Medications  Prior to Admission medications   Medication Sig Start Date End Date Taking? Authorizing Provider  aspirin EC 81 MG tablet Take 81 mg by mouth daily.   Yes Historical Provider, MD  carvedilol (COREG) 6.25 MG tablet Take 6.25 mg by mouth 2 (two) times daily with a meal.   Yes Historical Provider, MD  clindamycin (CLEOCIN) 300 MG capsule Take 1 capsule (300 mg total) by mouth 3 (three) times daily. 02/14/13  Yes Shanker Levora Dredge, MD  clopidogrel (PLAVIX) 75 MG tablet Take 75 mg by mouth daily.   Yes Historical Provider, MD    furosemide (LASIX) 40 MG tablet Take 40 mg by mouth 2 (two) times daily.   Yes Historical Provider, MD  insulin glargine (LANTUS) 100 UNIT/ML injection Inject 0.25 mLs (25 Units total) into the skin at bedtime. 02/14/13  Yes Shanker Levora Dredge, MD  metolazone (ZAROXOLYN) 5 MG tablet Take 1 tablet (5 mg total) by mouth daily as needed. 03/16/13  Yes Baker Pierini, FNP  ondansetron (ZOFRAN) 4 MG tablet Take 1 tablet (4 mg total) by mouth every 6 (six) hours as needed for nausea. 02/14/13  Yes Shanker Levora Dredge, MD  pantoprazole (PROTONIX) 20 MG tablet Take 1 tablet (20 mg total) by mouth daily. 03/16/13  Yes Baker Pierini, FNP  sertraline (ZOLOFT) 50 MG tablet Take 1 tablet (50 mg total) by mouth daily. 02/26/13  Yes Baker Pierini, FNP  simvastatin (ZOCOR) 5 MG tablet Take 5 mg by mouth at bedtime.   Yes Historical Provider, MD  zolpidem (AMBIEN) 5 MG tablet Take 5 mg by mouth at bedtime as needed for sleep.   Yes Historical Provider, MD    Family History  No family history on file.  Social History  History   Social History  . Marital Status: Widowed    Spouse Name: N/A    Number of Children: N/A  . Years of Education: N/A   Occupational History  . Not on file.   Social History Main Topics  . Smoking status: Never Smoker   .  Smokeless tobacco: Not on file  . Alcohol Use: No  . Drug Use: No  . Sexual Activity: Not on file   Other Topics Concern  . Not on file   Social History Narrative  . No narrative on file     Review of Systems, as per history of present illness otherwise negative General:  No chills, fever, night sweats or weight changes.  Cardiovascular:  No chest pain, dyspnea on exertion, edema, orthopnea, palpitations, paroxysmal nocturnal dyspnea. Dermatological: No rash, lesions/masses Respiratory: No cough, dyspnea Urologic: No hematuria, dysuria Abdominal:   No nausea, vomiting, diarrhea, bright red blood per rectum, melena, or  hematemesis Neurologic:  No visual changes, wkns, changes in mental status. All other systems reviewed and are otherwise negative except as noted above.  Physical Exam  Blood pressure 110/90, pulse 86, height 5' 9.5" (1.765 m), weight 161 lb (73.029 kg).  General: Pleasant, NAD Psych: Normal affect. Neuro: Alert and oriented X 3. Moves all extremities spontaneously. HEENT: Normal  Neck: Supple without bruits or JVD. Lungs:  Resp regular and unlabored, CTA. Heart: RRR no s3, s4, or murmurs. Abdomen: Soft, non-tender, non-distended, BS + x 4.  Extremities: No clubbing, cyanosis or edema. Radials 2+ and equal bilaterally. DP/DT poor B/L  Accessory Clinical Findings  ECG - sinus rhythm, 86 beats per minute, nonspecific ST and T wave abnormalities, prolonged QTc 497 ms  Lipid Panel  No results found for this basename: chol, trig, hdl, cholhdl, vldl, ldlcalc    Assessment & Plan  67 year old much older appearing gentleman with poorly controlled diabetes and nonischemic cardiomyopathy  1. Nonischemic cardiomyopathy - diagnosed in 2013, unknown LV ejection fraction, on carvedilol but no ACE inhibitor, we will check echocardiogram for LV ejection fraction and start an ACE inhibitor if still impaired.  2. Lipid profile - none on her records we will order  3. Claudications - we will order bilateral lower extremity arterial duplex  It is unclear why patient is on Plavix she is not aware we will request prior records and follow.  Tobias Alexander, Rexene Edison, MD 04/10/2013, 3:56 PM

## 2013-04-10 NOTE — Patient Instructions (Signed)
**Note De-Identified  Obfuscation** Your physician has requested that you have an echocardiogram. Echocardiography is a painless test that uses sound waves to create images of your heart. It provides your doctor with information about the size and shape of your heart and how well your heart's chambers and valves are working. This procedure takes approximately one hour. There are no restrictions for this procedure.  Your physician recommends that you return for lab work in: Monday 10/27  Your physician has requested that you have a lower or upper extremity arterial duplex. This test is an ultrasound of the arteries in the legs or arms. It looks at arterial blood flow in the legs and arms. Allow one hour for Lower and Upper Arterial scans. There are no restrictions or special instructions  Your physician wants you to follow-up in: 6 months. You will receive a reminder letter in the mail two months in advance. If you don't receive a letter, please call our office to schedule the follow-up appointment.  Please schedule pt for a defibrillator check soon.

## 2013-05-05 ENCOUNTER — Other Ambulatory Visit (INDEPENDENT_AMBULATORY_CARE_PROVIDER_SITE_OTHER): Payer: Medicare Other

## 2013-05-05 ENCOUNTER — Ambulatory Visit (HOSPITAL_COMMUNITY): Payer: Medicare Other | Attending: Cardiology

## 2013-05-05 ENCOUNTER — Ambulatory Visit (HOSPITAL_BASED_OUTPATIENT_CLINIC_OR_DEPARTMENT_OTHER): Payer: Medicare Other | Admitting: Cardiology

## 2013-05-05 ENCOUNTER — Encounter: Payer: Self-pay | Admitting: Cardiology

## 2013-05-05 ENCOUNTER — Other Ambulatory Visit: Payer: Self-pay

## 2013-05-05 ENCOUNTER — Ambulatory Visit (INDEPENDENT_AMBULATORY_CARE_PROVIDER_SITE_OTHER): Payer: Medicare Other

## 2013-05-05 VITALS — BP 124/85 | HR 94 | Resp 16 | Ht 68.0 in | Wt 150.0 lb

## 2013-05-05 DIAGNOSIS — I255 Ischemic cardiomyopathy: Secondary | ICD-10-CM

## 2013-05-05 DIAGNOSIS — I2589 Other forms of chronic ischemic heart disease: Secondary | ICD-10-CM

## 2013-05-05 DIAGNOSIS — B351 Tinea unguium: Secondary | ICD-10-CM

## 2013-05-05 DIAGNOSIS — I10 Essential (primary) hypertension: Secondary | ICD-10-CM | POA: Insufficient documentation

## 2013-05-05 DIAGNOSIS — E1140 Type 2 diabetes mellitus with diabetic neuropathy, unspecified: Secondary | ICD-10-CM

## 2013-05-05 DIAGNOSIS — E1149 Type 2 diabetes mellitus with other diabetic neurological complication: Secondary | ICD-10-CM

## 2013-05-05 DIAGNOSIS — M79609 Pain in unspecified limb: Secondary | ICD-10-CM

## 2013-05-05 DIAGNOSIS — E1142 Type 2 diabetes mellitus with diabetic polyneuropathy: Secondary | ICD-10-CM

## 2013-05-05 DIAGNOSIS — I079 Rheumatic tricuspid valve disease, unspecified: Secondary | ICD-10-CM | POA: Insufficient documentation

## 2013-05-05 DIAGNOSIS — I509 Heart failure, unspecified: Secondary | ICD-10-CM | POA: Insufficient documentation

## 2013-05-05 DIAGNOSIS — E119 Type 2 diabetes mellitus without complications: Secondary | ICD-10-CM | POA: Insufficient documentation

## 2013-05-05 DIAGNOSIS — I059 Rheumatic mitral valve disease, unspecified: Secondary | ICD-10-CM | POA: Insufficient documentation

## 2013-05-05 DIAGNOSIS — E785 Hyperlipidemia, unspecified: Secondary | ICD-10-CM

## 2013-05-05 DIAGNOSIS — R0989 Other specified symptoms and signs involving the circulatory and respiratory systems: Secondary | ICD-10-CM

## 2013-05-05 DIAGNOSIS — I379 Nonrheumatic pulmonary valve disorder, unspecified: Secondary | ICD-10-CM | POA: Insufficient documentation

## 2013-05-05 DIAGNOSIS — I70219 Atherosclerosis of native arteries of extremities with intermittent claudication, unspecified extremity: Secondary | ICD-10-CM

## 2013-05-05 LAB — LIPID PANEL
Cholesterol: 81 mg/dL (ref 0–200)
HDL: 28.1 mg/dL — ABNORMAL LOW (ref >39.00)
LDL Cholesterol: 41 mg/dL (ref 0–99)
Total CHOL/HDL Ratio: 3
Triglycerides: 58 mg/dL (ref 0.0–149.0)
VLDL: 11.6 mg/dL (ref 0.0–40.0)

## 2013-05-05 NOTE — Patient Instructions (Signed)
Onychomycosis/Fungal Toenails  WHAT IS IT? An infection that lies within the keratin of your nail plate that is caused by a fungus.  WHY ME? Fungal infections affect all ages, sexes, races, and creeds.  There may be many factors that predispose you to a fungal infection such as age, coexisting medical conditions such as diabetes, or an autoimmune disease; stress, medications, fatigue, genetics, etc.  Bottom line: fungus thrives in a warm, moist environment and your shoes offer such a location.  IS IT CONTAGIOUS? Theoretically, yes.  You do not want to share shoes, nail clippers or files with someone who has fungal toenails.  Walking around barefoot in the same room or sleeping in the same bed is unlikely to transfer the organism.  It is important to realize, however, that fungus can spread easily from one nail to the next on the same foot.  HOW DO WE TREAT THIS?  There are several ways to treat this condition.  Treatment may depend on many factors such as age, medications, pregnancy, liver and kidney conditions, etc.  It is best to ask your doctor which options are available to you.  1. No treatment.   Unlike many other medical concerns, you can live with this condition.  However for many people this can be a painful condition and may lead to ingrown toenails or a bacterial infection.  It is recommended that you keep the nails cut short to help reduce the amount of fungal nail. 2. Topical treatment.  These range from herbal remedies to prescription strength nail lacquers.  About 40-50% effective, topicals require twice daily application for approximately 9 to 12 months or until an entirely new nail has grown out.  The most effective topicals are medical grade medications available through physicians offices. 3. Oral antifungal medications.  With an 80-90% cure rate, the most common oral medication requires 3 to 4 months of therapy and stays in your system for a year as the new nail grows out.  Oral  antifungal medications do require blood work to make sure it is a safe drug for you.  A liver function panel will be performed prior to starting the medication and after the first month of treatment.  It is important to have the blood work performed to avoid any harmful side effects.  In general, this medication safe but blood work is required. 4. Laser Therapy.  This treatment is performed by applying a specialized laser to the affected nail plate.  This therapy is noninvasive, fast, and non-painful.  It is not covered by insurance and is therefore, out of pocket.  The results have been very good with a 80-95% cure rate.  The [facility] is the only practice in the area to offer this therapy. Permanent Nail Avulsion.  Removing the entire nail so that a new nail will not grow back  Recommendation for a little nail treatment at this time. Suggest obtaining Fungi-Nail over-the-counter at any drugstore or formula 3 which can be obtained at the [facility] office Apply either preparation twice daily to the affected nails for 12 month. Minimum keep nails trimmed and filed a regular basis         .Diabetes and Foot Care Diabetes may cause you to have problems because of poor blood supply (circulation) to your feet and legs. This may cause the skin on your feet to become thinner, break easier, and heal more slowly. Your skin may become dry, and the skin may peel and crack. You  may also have nerve damage in your legs and feet causing decreased feeling in them. You may not notice minor injuries to your feet that could lead to infections or more serious problems. Taking care of your feet is one of the most important things you can do for yourself.  HOME CARE INSTRUCTIONS  Wear shoes at all times, even in the house. Do not go barefoot. Bare feet are easily injured.  Check your feet daily for blisters, cuts, and redness. If you cannot see the bottom of your feet, use a mirror or ask someone for  help.  Wash your feet with warm water (do not use hot water) and mild soap. Then pat your feet and the areas between your toes until they are completely dry. Do not soak your feet as this can dry your skin.  Apply a moisturizing lotion or petroleum jelly (that does not contain alcohol and is unscented) to the skin on your feet and to dry, brittle toenails. Do not apply lotion between your toes.  Trim your toenails straight across. Do not dig under them or around the cuticle. File the edges of your nails with an emery board or nail file.  Do not cut corns or calluses or try to remove them with medicine.  Wear clean socks or stockings every day. Make sure they are not too tight. Do not wear knee-high stockings since they may decrease blood flow to your legs.  Wear shoes that fit properly and have enough cushioning. To break in new shoes, wear them for just a few hours a day. This prevents you from injuring your feet. Always look in your shoes before you put them on to be sure there are no objects inside.  Do not cross your legs. This may decrease the blood flow to your feet.  If you find a minor scrape, cut, or break in the skin on your feet, keep it and the skin around it clean and dry. These areas may be cleansed with mild soap and water. Do not cleanse the area with peroxide, alcohol, or iodine.  When you remove an adhesive bandage, be sure not to damage the skin around it.  If you have a wound, look at it several times a day to make sure it is healing.  Do not use heating pads or hot water bottles. They may burn your skin. If you have lost feeling in your feet or legs, you may not know it is happening until it is too late.  Make sure your health care provider performs a complete foot exam at least annually or more often if you have foot problems. Report any cuts, sores, or bruises to your health care provider immediately. SEEK MEDICAL CARE IF:   You have an injury that is not  healing.  You have cuts or breaks in the skin.  You have an ingrown nail.  You notice redness on your legs or feet.  You feel burning or tingling in your legs or feet.  You have pain or cramps in your legs and feet.  Your legs or feet are numb.  Your feet always feel cold. SEEK IMMEDIATE MEDICAL CARE IF:   There is increasing redness, swelling, or pain in or around a wound.  There is a red line that goes up your leg.  Pus is coming from a wound.  You develop a fever or as directed by your health care provider.  You notice a bad smell coming from an ulcer or wound.  Document Released: 06/01/2000 Document Revised: 02/04/2013 Document Reviewed: 11/11/2012 [facility] Patient Information 2014 [place], [place].

## 2013-05-05 NOTE — Progress Notes (Signed)
  Subjective:    Patient ID: Gary Gallegos, male    DOB: 08/08/1945, 67 y.o.   MRN: [id number] "Trim my toes up."  Diabetes He presents for his initial diabetic visit. He has type 2 diabetes mellitus. The initial diagnosis of diabetes was made 10 years ago. His disease course has been improving. Current diabetic treatment includes insulin injections. He is compliant with treatment most of the time. His weight is fluctuating minimally. He is following a low salt diet. He rarely participates in exercise. His home blood glucose trend is fluctuating minimally. He sees a podiatrist.     Review of Systems  All other systems reviewed and are negative.       Objective:   Physical Exam Neurovascular status as follows pedal pulses palpable DP plus one over 4 PT +2/4 bilateral. Capillary refill time 3 seconds all digits skin temperature warm turgor normal no edema rubor pallor noted mild varicosities noted bilateral. Neurologically epicritic and proprioceptive sensations intact and symmetric bilateral there is slight decreased sensation Semmes Weinstein testing to the digits first and fifth bilateral and plantar lateral foot bilateral other sensation areas are intact and unremarkable. Her is normal plantar response and DTRs. Orthopedic exam otherwise unremarkable rectus foot type mild semirigid digital contractures 2 through 5. Patient wearing appropriate diabetic socks and accommodative shoes at all times. No keratoses no open wounds or ulcerations are noted. Nails thick hypertrophic brittle friable discolored and incurvated 1 through 5 bilateral. Tender on palpation and debridement at this time.     Assessment & Plan:  Assessment this time his diabetes with mild early peripheral neuropathy. No significant biomechanical falls are identified. Nails thick brittle friable consistent with onychomycosis 1 through 5 bilateral deformity criptotic nails debrided x10. Recommended topical antifungal either formula  3 or Fungi-Nail applied twice daily to the affected nails for 12 months duration suggest a 3 month followup for continued palliative care  Alvan Dame DPM

## 2013-05-05 NOTE — Progress Notes (Signed)
Echo performed. 

## 2013-05-06 ENCOUNTER — Ambulatory Visit: Payer: Medicare Other | Admitting: Family

## 2013-05-06 ENCOUNTER — Telehealth: Payer: Self-pay

## 2013-05-06 DIAGNOSIS — I428 Other cardiomyopathies: Secondary | ICD-10-CM

## 2013-05-06 NOTE — Telephone Encounter (Addendum)
Lyn,    This patient was found to have severely decreased LV EF (10%). He will need to be scheduled for a cardiopulmonary test and also we need to refer him to CHF clinic (Dr Jones Broom or Dr Shirlee Latch).    He needs to be started on Lisinopril 5 mg PO daily. We need to stop his Plavix as he doesn't need it. We should repeat BMP in 3 weeks.    Hi cholesterol was normal.   Could you call him and tell him about it?   Thank you very much!   Katarina      Message on pts phone states that the pts phone has been disconnected or is no longer in service. I have mailed a letter to pts address asking him to call office as soon as he receives this letter.

## 2013-05-06 NOTE — Progress Notes (Signed)
Patient ID: Gary Gallegos, male   DOB: 03/02/1946, 67 y.o.   MRN: [id number]  We received results from this patient who was diagnosed with non-ischemic cardiomyopathy a year ago, s/p ICD implantation. He moved to the area recently and came to see [place] to establish care. He is currently NYHa II-III. He was not aware about his LV EF at the time of the clinic visit and was not on ACEI.   His echocardiogram shows LV EF 10% with pseudonormal pattern and elevated LVEDP. There is also moderate RV dysfunction with moderate pulmonary hypertension. We will start the patient on low dose Lisinopril and uptitrate as tolerated by BP. We will order a cardiopulmonary test and refer him to CHF clinic. He will also require right sided catheterization. Left heart cath was normal a year ago based on patient's report.  D/C Plavix. BMP in 3 weeks.  Tobias Alexander, H 05/06/2013

## 2013-05-08 ENCOUNTER — Encounter (HOSPITAL_COMMUNITY): Payer: Medicare Other

## 2013-05-18 ENCOUNTER — Encounter (HOSPITAL_COMMUNITY): Payer: Medicare Other

## 2013-05-18 DIAGNOSIS — R0989 Other specified symptoms and signs involving the circulatory and respiratory systems: Secondary | ICD-10-CM

## 2013-05-28 ENCOUNTER — Ambulatory Visit (HOSPITAL_COMMUNITY): Payer: Medicare Other | Attending: Cardiology

## 2013-05-28 ENCOUNTER — Encounter: Payer: Self-pay | Admitting: Cardiovascular Disease

## 2013-05-28 DIAGNOSIS — I739 Peripheral vascular disease, unspecified: Secondary | ICD-10-CM

## 2013-05-28 DIAGNOSIS — E119 Type 2 diabetes mellitus without complications: Secondary | ICD-10-CM | POA: Insufficient documentation

## 2013-05-28 DIAGNOSIS — I70219 Atherosclerosis of native arteries of extremities with intermittent claudication, unspecified extremity: Secondary | ICD-10-CM | POA: Insufficient documentation

## 2013-05-28 DIAGNOSIS — I10 Essential (primary) hypertension: Secondary | ICD-10-CM | POA: Insufficient documentation

## 2013-05-28 DIAGNOSIS — E785 Hyperlipidemia, unspecified: Secondary | ICD-10-CM | POA: Insufficient documentation

## 2013-06-09 MED ORDER — LISINOPRIL 5 MG PO TABS: 5.0000 mg | ORAL_TABLET | Freq: Every day | ORAL | Status: DC

## 2013-06-09 NOTE — Telephone Encounter (Signed)
I was able to reach the pts daughter today. She states that they have moved and did not receive the letter that I mailed asking them to call the office. Pts daughter is advised of the pts echo results and recommendations from Dr Delton See, she verbalized understanding and agrees with plan.

## 2013-06-10 ENCOUNTER — Telehealth: Payer: Self-pay | Admitting: Cardiology

## 2013-06-10 NOTE — Telephone Encounter (Signed)
Left message for pt to call back - call went straight to voicemail

## 2013-06-10 NOTE — Telephone Encounter (Signed)
New Message ° °Pt daughter called for results// Please call  °

## 2013-06-16 ENCOUNTER — Encounter (HOSPITAL_COMMUNITY): Payer: Medicare Other

## 2013-06-24 ENCOUNTER — Telehealth: Payer: Self-pay | Admitting: Family

## 2013-06-24 NOTE — Telephone Encounter (Signed)
**Note De-Identified  Obfuscation** The Pt's daughter states that no one from this office has called her or left any messages on her VM concerning her father. She is upset because she has been advised that it is really hard to reach her concerning her father as her phone was saying that the number was disconnected. 2 weeks ago I was able to reach the daughter on her cell phone and gave her the results of her Fathers Echo and recommendations from Dr Delton See. She wanted to know why it took so long to get results and I explained to her that I not only called several times daily but that I also mailed a letter to the pt's address asking him to contact me at this office. She stated at that time that they had moved and had not contacted this office of new address. I returned this call to the pts daughter this morning and she states that the people in this office is lying about trying to reach her as she states there is nothing wrong with her phone. She asked me what address was listed in the pts chart and I gave her that information, she stated that the address I just gave her is still the old address and then she stated "there is another person in that office not doing their job because I called 2 weeks ago and changed it to his new address".  I then offered to transfere her call to scheduling so they could update his information and she agreed. Call was transferred to [facility].

## 2013-06-24 NOTE — Telephone Encounter (Signed)
Pt's daughter calling on behalf of pt to report pt was seen by Dr. Delton See at [facility] on 06/10/13.  However, they have been unsuccessful in getting his test results, due to not getting a response back from Dr. Delton See.  Pt has reviewed mychart but does not understand the information that was reported.  Daughter wants to know if Gary Gallegos can see the results and let them know if father is ok.

## 2013-06-24 NOTE — Telephone Encounter (Signed)
Spoke with pt's daughter and her main concern is that she does not know what's going on with her father (the pt). I advised her that she should get a better understanding of what is going on when he sees the CHF doctor on 06/29/13. Faith verbalized understanding and was thankful for the returned call

## 2013-06-29 ENCOUNTER — Encounter (HOSPITAL_COMMUNITY): Payer: Medicare Other

## 2013-06-29 ENCOUNTER — Encounter (HOSPITAL_COMMUNITY): Payer: Self-pay

## 2013-06-29 ENCOUNTER — Ambulatory Visit (HOSPITAL_COMMUNITY)
Admission: RE | Admit: 2013-06-29 | Discharge: 2013-06-29 | Disposition: A | Discharge status: Home / Self Care | Payer: Medicare Other | Source: Ambulatory Visit | Attending: Internal Medicine | Admitting: Internal Medicine

## 2013-06-29 VITALS — BP 112/83 | HR 86 | Resp 18 | Wt 148.0 lb

## 2013-06-29 DIAGNOSIS — E785 Hyperlipidemia, unspecified: Secondary | ICD-10-CM | POA: Insufficient documentation

## 2013-06-29 DIAGNOSIS — Z794 Long term (current) use of insulin: Secondary | ICD-10-CM | POA: Insufficient documentation

## 2013-06-29 DIAGNOSIS — I5022 Chronic systolic (congestive) heart failure: Secondary | ICD-10-CM | POA: Insufficient documentation

## 2013-06-29 DIAGNOSIS — Z7982 Long term (current) use of aspirin: Secondary | ICD-10-CM | POA: Insufficient documentation

## 2013-06-29 DIAGNOSIS — Z79899 Other long term (current) drug therapy: Secondary | ICD-10-CM | POA: Insufficient documentation

## 2013-06-29 DIAGNOSIS — I10 Essential (primary) hypertension: Secondary | ICD-10-CM | POA: Insufficient documentation

## 2013-06-29 DIAGNOSIS — E1139 Type 2 diabetes mellitus with other diabetic ophthalmic complication: Secondary | ICD-10-CM | POA: Insufficient documentation

## 2013-06-29 DIAGNOSIS — I509 Heart failure, unspecified: Secondary | ICD-10-CM | POA: Insufficient documentation

## 2013-06-29 DIAGNOSIS — I5042 Chronic combined systolic (congestive) and diastolic (congestive) heart failure: Secondary | ICD-10-CM | POA: Insufficient documentation

## 2013-06-29 DIAGNOSIS — Z95810 Presence of automatic (implantable) cardiac defibrillator: Secondary | ICD-10-CM | POA: Insufficient documentation

## 2013-06-29 DIAGNOSIS — E11319 Type 2 diabetes mellitus with unspecified diabetic retinopathy without macular edema: Secondary | ICD-10-CM | POA: Insufficient documentation

## 2013-06-29 LAB — BASIC METABOLIC PANEL
BUN: 24 mg/dL — ABNORMAL HIGH (ref 6–23)
CALCIUM: 9.4 mg/dL (ref 8.4–10.5)
CO2: 24 mEq/L (ref 19–32)
Chloride: 103 mEq/L (ref 96–112)
Creatinine, Ser: 0.85 mg/dL (ref 0.50–1.35)
GFR calc Af Amer: >90 mL/min (ref >90)
GFR, EST NON AFRICAN AMERICAN: 89 mL/min — AB (ref >90)
GLUCOSE: 157 mg/dL — AB (ref 70–99)
Potassium: 4.1 mEq/L (ref 3.7–5.3)
SODIUM: 141 meq/L (ref 137–147)

## 2013-06-29 MED ORDER — DIGOXIN 125 MCG PO TABS: 0.1250 mg | ORAL_TABLET | Freq: Every day | ORAL | Status: DC

## 2013-06-29 MED ORDER — FUROSEMIDE 40 MG PO TABS: 40.0000 mg | ORAL_TABLET | Freq: Two times a day (BID) | ORAL | Status: DC

## 2013-06-29 MED ORDER — METOLAZONE 5 MG PO TABS: 5.0000 mg | ORAL_TABLET | Freq: Every day | ORAL | Status: DC | PRN

## 2013-06-29 NOTE — Patient Instructions (Addendum)
Take metolazone 5 mg today and tomorrow and then start taking once weekly.  Start digoxin 0.125 mg daily  Call any issues.  Will call with lab results.  Come to appointment on Thursday for cardiopulmonary test  Do the following things EVERYDAY: 1) Weigh yourself in the morning before breakfast. Write it down and keep it in a log. 2) Take your medicines as prescribed 3) Eat low salt foods-Limit salt (sodium) to 2000 mg per day.  4) Stay as active as you can everyday 5) Limit all fluids for the day to less than 2 liters

## 2013-06-29 NOTE — Progress Notes (Signed)
PCP: Dr. Adline Mango, FNP Primary Cardiologist: Dr. [name]  HPI: Gary Gallegos is a 68 yo male referred to the HF clinic by Dr.Nelson. He has a history of DM2 with severe diabetic retinopathy, cataracts HTN, HLD, chronic systolic HF due to severe, EF 14% (11/14) s/p ICD Medtronic.   Started having problems with his heart in early 11-03-11. Was followed by cardiologist at [facility]. Had cath in Nov 03, 2011 and arteries were "fine".  ECHO 05/05/13: EF 10%, diff HK, grade II DD, mild MR, LA mod dilated, RV mildly dilated and sys fx mod reduced, mod TR  Wife died at the end of Nov 03, 2011 due to HF. Moved from [facility] [place] last July 2014. Past year his HF has gotten worse. He has gotten progressively weaker and has had frequent falls. Reports legs weak, however no claudication pain. Not able to walk around the whole grocery store without stopping d/t leg tiredness and his vision. Denies SOB, orthopnea or PND. Occasional edema. Says he feels like vision is major issue.Takes metolazone about once a month for LE edema. Weight at home 170 lbs. + abdominal distention. Following low salt diet and drinking less than 2L a day. Very fatigued and no appetite.   ROS: All systems negative except as listed in HPI, PMH and Problem List.  Past Medical History  Diagnosis Date  . CHF (congestive heart failure)   . Diabetes mellitus without complication     Current Outpatient Prescriptions  Medication Sig Dispense Refill  . aspirin EC 81 MG tablet Take 81 mg by mouth daily.      . carvedilol (COREG) 6.25 MG tablet Take 6.25 mg by mouth 2 (two) times daily with a meal.      . furosemide (LASIX) 40 MG tablet Take 40 mg by mouth 2 (two) times daily.      . insulin glargine (LANTUS) 100 UNIT/ML injection Inject 0.25 mLs (25 Units total) into the skin at bedtime.  10 mL  12  . lisinopril (PRINIVIL,ZESTRIL) 5 MG tablet Take 1 tablet (5 mg total) by mouth daily.  30 tablet  3  . metolazone (ZAROXOLYN) 5 MG tablet  Take 1 tablet (5 mg total) by mouth daily as needed.  30 tablet  1  . ondansetron (ZOFRAN) 4 MG tablet Take 1 tablet (4 mg total) by mouth every 6 (six) hours as needed for nausea.  20 tablet  0  . pantoprazole (PROTONIX) 20 MG tablet Take 1 tablet (20 mg total) by mouth daily.  30 tablet  5  . sertraline (ZOLOFT) 50 MG tablet Take 1 tablet (50 mg total) by mouth daily.  30 tablet  3  . simvastatin (ZOCOR) 5 MG tablet Take 5 mg by mouth at bedtime.      Marland Kitchen zolpidem (AMBIEN) 5 MG tablet Take 5 mg by mouth at bedtime as needed for sleep.       No current facility-administered medications for this encounter.    Filed Vitals:   06/29/13 1414  BP: 112/83  Pulse: 86  Resp: 18  Weight: 148 lb (67.132 kg)  SpO2: 100%    PHYSICAL EXAM: General: Chronically ill appearing. No resp difficulty, sitting in WC, daughter present HEENT: normal; arcus senilus. Lenses don't appear clouded Neck: supple. JVP 8-9. Carotids 2+ bilaterally; no bruits. No lymphadenopathy or thryomegaly appreciated. Cor: PMI laterally displaced. Regular rate & rhythm. No rubs or gallops; +3/6 systolic murmur Lungs: clear Abdomen: soft, nontender, mildly distended. No hepatosplenomegaly. No bruits or masses. Good  bowel sounds. Extremities: no cyanosis, clubbing, rash, edema; cool extremities Neuro: alert & orientedx3, cranial nerves grossly intact. Moves all 4 extremities w/o difficulty. Affect pleasant.  ASSESSMENT & PLAN:  1) Chronic combined systolic/diastolic HF:    -- due to NICM EF 10% 2) Diabetes with diabetic retinopathy  Gary COSGROVE, NP-C  Patient seen and examined with Ulla PotashAli Cosgrove, NP. We discussed all aspects of the encounter. I agree with the assessment and plan as stated above.   Very difficult situation. I reviewed echo and he has severe biventricular HR. Currently NYHA IIIB but he blames most of his symptoms on his eyes. He is scheduled for CPX test (will likely need to do this on bike) to further  evaluate degree and etiology of limitation. If test shows significant cardiac limitation, will then proceed with RHC to evaluate his hemodynamics. Currently would not be transplant or VAD candidate due to diabetic retinopathy and RH failure, respectively. Thus, if hemodynamics bad home inotropes may be the best answer. He is also volume overloaded. Will give him metolazone for 2 days. Reinforced need for daily weights and reviewed use of sliding scale diuretics.Add digoxin 0.125 daily. Would like to add spiro in near future. Check labs today.   Will need f/u with optho for surgery to address diabetic retinopathy.   Sumer Moorehouse,MD 3:13 PM

## 2013-06-30 ENCOUNTER — Other Ambulatory Visit: Payer: Medicare Other

## 2013-07-02 ENCOUNTER — Ambulatory Visit (HOSPITAL_BASED_OUTPATIENT_CLINIC_OR_DEPARTMENT_OTHER)
Admission: RE | Admit: 2013-07-02 | Discharge: 2013-07-02 | Disposition: A | Discharge status: Home / Self Care | Payer: Medicare Other | Source: Ambulatory Visit | Attending: Internal Medicine | Admitting: Internal Medicine

## 2013-07-02 ENCOUNTER — Ambulatory Visit (HOSPITAL_COMMUNITY): Payer: Medicare Other

## 2013-07-02 ENCOUNTER — Encounter (HOSPITAL_COMMUNITY): Payer: Self-pay

## 2013-07-02 VITALS — BP 112/78 | HR 88 | Wt 161.8 lb

## 2013-07-02 DIAGNOSIS — I5042 Chronic combined systolic (congestive) and diastolic (congestive) heart failure: Secondary | ICD-10-CM | POA: Insufficient documentation

## 2013-07-02 DIAGNOSIS — E1139 Type 2 diabetes mellitus with other diabetic ophthalmic complication: Secondary | ICD-10-CM | POA: Insufficient documentation

## 2013-07-02 DIAGNOSIS — Z79899 Other long term (current) drug therapy: Secondary | ICD-10-CM | POA: Insufficient documentation

## 2013-07-02 DIAGNOSIS — I5022 Chronic systolic (congestive) heart failure: Secondary | ICD-10-CM

## 2013-07-02 DIAGNOSIS — E11319 Type 2 diabetes mellitus with unspecified diabetic retinopathy without macular edema: Secondary | ICD-10-CM

## 2013-07-02 DIAGNOSIS — I509 Heart failure, unspecified: Secondary | ICD-10-CM

## 2013-07-02 DIAGNOSIS — E785 Hyperlipidemia, unspecified: Secondary | ICD-10-CM

## 2013-07-02 DIAGNOSIS — Z794 Long term (current) use of insulin: Secondary | ICD-10-CM

## 2013-07-02 DIAGNOSIS — I10 Essential (primary) hypertension: Secondary | ICD-10-CM

## 2013-07-02 LAB — BASIC METABOLIC PANEL
BUN: 21 mg/dL (ref 6–23)
CO2: 27 meq/L (ref 19–32)
CREATININE: 0.84 mg/dL (ref 0.50–1.35)
Calcium: 9.3 mg/dL (ref 8.4–10.5)
Chloride: 100 mEq/L (ref 96–112)
GFR, EST NON AFRICAN AMERICAN: 90 mL/min — AB (ref >90)
Glucose, Bld: 140 mg/dL — ABNORMAL HIGH (ref 70–99)
Potassium: 3.9 mEq/L (ref 3.7–5.3)
SODIUM: 140 meq/L (ref 137–147)

## 2013-07-02 MED ORDER — TORSEMIDE 20 MG PO TABS: 60.0000 mg | ORAL_TABLET | Freq: Every day | ORAL | Status: DC

## 2013-07-02 MED ORDER — POTASSIUM CHLORIDE ER 20 MEQ PO TBCR: 20.0000 meq | EXTENDED_RELEASE_TABLET | Freq: Every day | ORAL | Status: DC

## 2013-07-02 NOTE — Progress Notes (Signed)
Patient ID: Gary Gallegos, male   DOB: April 17, 1946, 68 y.o.   MRN: [id number] PCP: Dr. Adline Mango, FNP Primary Cardiologist: Dr. [name]  HPI: Gary Gallegos is a 68 yo male referred to the HF clinic by Dr.Nelson. He has a history of DM2 with severe diabetic retinopathy, cataracts HTN, HLD, chronic systolic HF due to severe, EF 58% (11/14) s/p ICD Medtronic.   Started having problems with his heart in early 2013. Was followed by cardiologist at [facility]. Had cath in 2013 and arteries were "fine".  ECHO 05/05/13: EF 10%, diff HK, grade II DD, mild MR, LA mod dilated, RV mildly dilated and sys fx mod reduced, mod TR  Acute Visit: Seen in the HF clinic on Monday of this week and gave him metolazone 2.5 mg for 2 days. He was scheduled for CPX on the bike today and when arrived is up 12 lbs. We also started digoxin 0.125 mg daily. Took metolazone 5 mg on Tuesday, Wednesday and Thursday with good UOP. Not sure if weight was accurate on our scale on Monday. Denies SOB, orthopnea, PND or CP. Weight at home 160 lbs. Ate a lot yesterday reports he had 2 bowls cereal, 2 hot dogs, and a vegetable plate. Drinking less than 2L a day.   ROS: All systems negative except as listed in HPI, PMH and Problem List.  Past Medical History  Diagnosis Date  . CHF (congestive heart failure)   . Diabetes mellitus without complication     Current Outpatient Prescriptions  Medication Sig Dispense Refill  . aspirin EC 81 MG tablet Take 81 mg by mouth daily.      . carvedilol (COREG) 6.25 MG tablet Take 6.25 mg by mouth 2 (two) times daily with a meal.      . digoxin (LANOXIN) 0.125 MG tablet Take 1 tablet (0.125 mg total) by mouth daily.  30 tablet  3  . furosemide (LASIX) 40 MG tablet Take 1 tablet (40 mg total) by mouth 2 (two) times daily.  60 tablet  3  . insulin glargine (LANTUS) 100 UNIT/ML injection Inject 0.25 mLs (25 Units total) into the skin at bedtime.  10 mL  12  . lisinopril  (PRINIVIL,ZESTRIL) 5 MG tablet Take 1 tablet (5 mg total) by mouth daily.  30 tablet  3  . metolazone (ZAROXOLYN) 5 MG tablet Take 1 tablet (5 mg total) by mouth daily as needed.  30 tablet  4  . ondansetron (ZOFRAN) 4 MG tablet Take 1 tablet (4 mg total) by mouth every 6 (six) hours as needed for nausea.  20 tablet  0  . pantoprazole (PROTONIX) 20 MG tablet Take 1 tablet (20 mg total) by mouth daily.  30 tablet  5  . sertraline (ZOLOFT) 50 MG tablet Take 1 tablet (50 mg total) by mouth daily.  30 tablet  3  . simvastatin (ZOCOR) 5 MG tablet Take 5 mg by mouth at bedtime.      Marland Kitchen zolpidem (AMBIEN) 5 MG tablet Take 5 mg by mouth at bedtime as needed for sleep.       No current facility-administered medications for this encounter.    Filed Vitals:   07/02/13 1608  BP: 112/78  Pulse: 88  Weight: 161 lb 12 oz (73.369 kg)  SpO2: 100%    PHYSICAL EXAM: General: Chronically ill appearing. No resp difficulty, sitting in WC, daughter present HEENT: normal; arcus senilus. Lenses don't appear clouded Neck: supple. JVP to jaw . Carotids 2+ bilaterally;  no bruits. No lymphadenopathy or thryomegaly appreciated. Cor: PMI laterally displaced. Regular rate & rhythm. No rubs or gallops; +3/6 systolic murmur Lungs: clear Abdomen: soft, nontender, + distended. No hepatosplenomegaly. No bruits or masses. Good bowel sounds. Extremities: no cyanosis, clubbing, rash, edema; cool extremities Neuro: alert & orientedx3, cranial nerves grossly intact. Moves all 4 extremities w/o difficulty. Affect pleasant.  ASSESSMENT & PLAN:  1) Chronic combined systolic/diastolic HF:    -- due to NICM EF 10% 2) Diabetes with diabetic retinopathy  Acute visit work-in today. Patient is up about 12 lbs since Mondays visit. He was scheduled for CPX test today on the bike, however it was cancelled. Will give 5 mg metolazone Friday, Sat and Sun and change lasix to torsemide 60 mg daily. Check BMET today. Not currently on KCL will  start 20 meq daily and adjust accordingly. Will bring back to clinic next week to reassess fluid status and if stable then schedule RHC to assess hemodynamics. Likely will need home inotropes since he currently is not would not be transplant or VAD candidate due to diabetic retinopathy and RH failure.  Ulla Potashosgrove, Ali B NP-C 10:04 PM  Patient seen and examined with Ulla PotashAli Cosgrove, NP. We discussed all aspects of the encounter. I agree with the assessment and plan as stated above. Volume status significantly worse than earlier this week.   Agree with adding metolazone and increasing torsemide. Will plan RHC next week.   Truman Haywardaniel Bensimhon,MD 4:35 PM

## 2013-07-02 NOTE — Patient Instructions (Addendum)
Stop furosmeide (lasix)  Start torsemide 60 mg daily.  Will call with lab results.  Continue metolazone 5 mg Friday, Saturday and Sunday.   Start potassium chloride 20 meq daily. 1 tablet daily.  Follow up next Tuesday 3:20 pm  Do the following things EVERYDAY: 1) Weigh yourself in the morning before breakfast. Write it down and keep it in a log. 2) Take your medicines as prescribed 3) Eat low salt foods-Limit salt (sodium) to 2000 mg per day.  4) Stay as active as you can everyday 5) Limit all fluids for the day to less than 2 liters 6)

## 2013-07-05 ENCOUNTER — Inpatient Hospital Stay (HOSPITAL_COMMUNITY)
Admission: EM | Admit: 2013-07-05 | Discharge: 2013-07-11 | DRG: 391 | Disposition: A | Discharge status: Home Health | Payer: Medicare Other | Attending: Internal Medicine | Admitting: Internal Medicine

## 2013-07-05 ENCOUNTER — Other Ambulatory Visit: Payer: Self-pay

## 2013-07-05 ENCOUNTER — Encounter (HOSPITAL_COMMUNITY): Payer: Self-pay | Admitting: Emergency Medicine

## 2013-07-05 ENCOUNTER — Emergency Department (HOSPITAL_COMMUNITY): Payer: Medicare Other

## 2013-07-05 DIAGNOSIS — K529 Noninfective gastroenteritis and colitis, unspecified: Secondary | ICD-10-CM | POA: Diagnosis present

## 2013-07-05 DIAGNOSIS — K2100 Gastro-esophageal reflux disease with esophagitis, without bleeding: Secondary | ICD-10-CM

## 2013-07-05 DIAGNOSIS — J9600 Acute respiratory failure, unspecified whether with hypoxia or hypercapnia: Secondary | ICD-10-CM | POA: Diagnosis not present

## 2013-07-05 DIAGNOSIS — E860 Dehydration: Secondary | ICD-10-CM | POA: Diagnosis present

## 2013-07-05 DIAGNOSIS — K920 Hematemesis: Secondary | ICD-10-CM | POA: Diagnosis present

## 2013-07-05 DIAGNOSIS — F1020 Alcohol dependence, uncomplicated: Secondary | ICD-10-CM | POA: Diagnosis present

## 2013-07-05 DIAGNOSIS — E1139 Type 2 diabetes mellitus with other diabetic ophthalmic complication: Secondary | ICD-10-CM | POA: Diagnosis present

## 2013-07-05 DIAGNOSIS — K209 Esophagitis, unspecified without bleeding: Principal | ICD-10-CM | POA: Diagnosis present

## 2013-07-05 DIAGNOSIS — I429 Cardiomyopathy, unspecified: Secondary | ICD-10-CM | POA: Diagnosis present

## 2013-07-05 DIAGNOSIS — F29 Unspecified psychosis not due to a substance or known physiological condition: Secondary | ICD-10-CM | POA: Diagnosis not present

## 2013-07-05 DIAGNOSIS — R05 Cough: Secondary | ICD-10-CM

## 2013-07-05 DIAGNOSIS — I5023 Acute on chronic systolic (congestive) heart failure: Secondary | ICD-10-CM | POA: Diagnosis not present

## 2013-07-05 DIAGNOSIS — D649 Anemia, unspecified: Secondary | ICD-10-CM | POA: Diagnosis present

## 2013-07-05 DIAGNOSIS — I10 Essential (primary) hypertension: Secondary | ICD-10-CM | POA: Diagnosis present

## 2013-07-05 DIAGNOSIS — E119 Type 2 diabetes mellitus without complications: Secondary | ICD-10-CM

## 2013-07-05 DIAGNOSIS — D696 Thrombocytopenia, unspecified: Secondary | ICD-10-CM | POA: Diagnosis present

## 2013-07-05 DIAGNOSIS — R160 Hepatomegaly, not elsewhere classified: Secondary | ICD-10-CM | POA: Diagnosis present

## 2013-07-05 DIAGNOSIS — IMO0002 Reserved for concepts with insufficient information to code with codable children: Secondary | ICD-10-CM | POA: Diagnosis present

## 2013-07-05 DIAGNOSIS — R531 Weakness: Secondary | ICD-10-CM

## 2013-07-05 DIAGNOSIS — Z95810 Presence of automatic (implantable) cardiac defibrillator: Secondary | ICD-10-CM

## 2013-07-05 DIAGNOSIS — I509 Heart failure, unspecified: Secondary | ICD-10-CM

## 2013-07-05 DIAGNOSIS — E11319 Type 2 diabetes mellitus with unspecified diabetic retinopathy without macular edema: Secondary | ICD-10-CM | POA: Diagnosis present

## 2013-07-05 DIAGNOSIS — H543 Unqualified visual loss, both eyes: Secondary | ICD-10-CM | POA: Diagnosis present

## 2013-07-05 DIAGNOSIS — R570 Cardiogenic shock: Secondary | ICD-10-CM | POA: Diagnosis not present

## 2013-07-05 DIAGNOSIS — K7030 Alcoholic cirrhosis of liver without ascites: Secondary | ICD-10-CM | POA: Diagnosis present

## 2013-07-05 DIAGNOSIS — Z515 Encounter for palliative care: Secondary | ICD-10-CM

## 2013-07-05 DIAGNOSIS — K2970 Gastritis, unspecified, without bleeding: Secondary | ICD-10-CM | POA: Diagnosis present

## 2013-07-05 DIAGNOSIS — E876 Hypokalemia: Secondary | ICD-10-CM | POA: Diagnosis not present

## 2013-07-05 DIAGNOSIS — K7460 Unspecified cirrhosis of liver: Secondary | ICD-10-CM

## 2013-07-05 DIAGNOSIS — I428 Other cardiomyopathies: Secondary | ICD-10-CM | POA: Diagnosis present

## 2013-07-05 DIAGNOSIS — R197 Diarrhea, unspecified: Secondary | ICD-10-CM

## 2013-07-05 DIAGNOSIS — R059 Cough, unspecified: Secondary | ICD-10-CM

## 2013-07-05 DIAGNOSIS — E1165 Type 2 diabetes mellitus with hyperglycemia: Secondary | ICD-10-CM | POA: Diagnosis present

## 2013-07-05 DIAGNOSIS — E785 Hyperlipidemia, unspecified: Secondary | ICD-10-CM | POA: Diagnosis present

## 2013-07-05 DIAGNOSIS — I5022 Chronic systolic (congestive) heart failure: Secondary | ICD-10-CM

## 2013-07-05 DIAGNOSIS — K2990 Gastroduodenitis, unspecified, without bleeding: Secondary | ICD-10-CM

## 2013-07-05 DIAGNOSIS — R112 Nausea with vomiting, unspecified: Secondary | ICD-10-CM | POA: Diagnosis present

## 2013-07-05 DIAGNOSIS — Z794 Long term (current) use of insulin: Secondary | ICD-10-CM

## 2013-07-05 DIAGNOSIS — D689 Coagulation defect, unspecified: Secondary | ICD-10-CM | POA: Diagnosis present

## 2013-07-05 DIAGNOSIS — Z66 Do not resuscitate: Secondary | ICD-10-CM | POA: Diagnosis not present

## 2013-07-05 HISTORY — DX: Essential (primary) hypertension: I10

## 2013-07-05 LAB — CBC WITH DIFFERENTIAL/PLATELET
BASOS ABS: 0 10*3/uL (ref 0.0–0.1)
Basophils Relative: 0 % (ref 0–1)
EOS PCT: 0 % (ref 0–5)
Eosinophils Absolute: 0 10*3/uL (ref 0.0–0.7)
HCT: 50 % (ref 39.0–52.0)
Hemoglobin: 17.3 g/dL — ABNORMAL HIGH (ref 13.0–17.0)
LYMPHS ABS: 0.4 10*3/uL — AB (ref 0.7–4.0)
Lymphocytes Relative: 3 % — ABNORMAL LOW (ref 12–46)
MCH: 31.8 pg (ref 26.0–34.0)
MCHC: 34.6 g/dL (ref 30.0–36.0)
MCV: 91.9 fL (ref 78.0–100.0)
Monocytes Absolute: 1.1 10*3/uL — ABNORMAL HIGH (ref 0.1–1.0)
Monocytes Relative: 9 % (ref 3–12)
NEUTROS ABS: 11.3 10*3/uL — AB (ref 1.7–7.7)
NEUTROS PCT: 88 % — AB (ref 43–77)
PLATELETS: 131 10*3/uL — AB (ref 150–400)
RBC: 5.44 MIL/uL (ref 4.22–5.81)
RDW: 15 % (ref 11.5–15.5)
WBC: 12.9 10*3/uL — AB (ref 4.0–10.5)

## 2013-07-05 LAB — COMPREHENSIVE METABOLIC PANEL
ALK PHOS: 201 U/L — AB (ref 39–117)
ALT: 12 U/L (ref 0–53)
AST: 26 U/L (ref 0–37)
Albumin: 3.5 g/dL (ref 3.5–5.2)
BUN: 20 mg/dL (ref 6–23)
CALCIUM: 9.7 mg/dL (ref 8.4–10.5)
CO2: 36 mEq/L — ABNORMAL HIGH (ref 19–32)
Chloride: 93 mEq/L — ABNORMAL LOW (ref 96–112)
Creatinine, Ser: 0.96 mg/dL (ref 0.50–1.35)
GFR calc Af Amer: >90 mL/min (ref >90)
GFR calc non Af Amer: 85 mL/min — ABNORMAL LOW (ref >90)
Glucose, Bld: 230 mg/dL — ABNORMAL HIGH (ref 70–99)
Potassium: 3.7 mEq/L (ref 3.7–5.3)
SODIUM: 142 meq/L (ref 137–147)
Total Bilirubin: 2.4 mg/dL — ABNORMAL HIGH (ref 0.3–1.2)
Total Protein: 7.7 g/dL (ref 6.0–8.3)

## 2013-07-05 LAB — INFLUENZA PANEL BY PCR (TYPE A & B)
H1N1FLUPCR: NOT DETECTED
INFLBPCR: NEGATIVE
Influenza A By PCR: NEGATIVE

## 2013-07-05 LAB — POCT I-STAT TROPONIN I: Troponin i, poc: 0 ng/mL (ref 0.00–0.08)

## 2013-07-05 LAB — PRO B NATRIURETIC PEPTIDE: Pro B Natriuretic peptide (BNP): 14925 pg/mL — ABNORMAL HIGH (ref 0–125)

## 2013-07-05 MED ORDER — FUROSEMIDE 10 MG/ML IJ SOLN
40.0000 mg | Freq: Once | INTRAMUSCULAR | Status: AC
  Administered 2013-07-05: 40 mg via INTRAVENOUS
  Filled 2013-07-05: qty 4

## 2013-07-05 MED ORDER — IOHEXOL 300 MG/ML  SOLN
20.0000 mL | INTRAMUSCULAR | Status: AC
  Administered 2013-07-05: 25 mL via ORAL

## 2013-07-05 MED ORDER — INSULIN GLARGINE 100 UNIT/ML ~~LOC~~ SOLN
5.0000 [IU] | Freq: Every day | SUBCUTANEOUS | Status: DC
  Administered 2013-07-06 – 2013-07-11 (×6): 5 [IU] via SUBCUTANEOUS
  Filled 2013-07-05 (×8): qty 0.05

## 2013-07-05 MED ORDER — ONDANSETRON HCL 4 MG/2ML IJ SOLN
4.0000 mg | Freq: Once | INTRAMUSCULAR | Status: AC
  Administered 2013-07-05: 4 mg via INTRAVENOUS
  Filled 2013-07-05: qty 2

## 2013-07-05 MED ORDER — SODIUM CHLORIDE 0.9 % IV BOLUS (SEPSIS)
250.0000 mL | Freq: Once | INTRAVENOUS | Status: AC
  Administered 2013-07-05: 250 mL via INTRAVENOUS

## 2013-07-05 MED ORDER — IOHEXOL 300 MG/ML  SOLN
100.0000 mL | Freq: Once | INTRAMUSCULAR | Status: AC | PRN
  Administered 2013-07-05: 100 mL via INTRAVENOUS

## 2013-07-05 NOTE — ED Notes (Signed)
Pt states contrast completed. CT informed

## 2013-07-05 NOTE — ED Notes (Signed)
Pt is aware of urine sample ordered. Pt states unable to use the bathroom at this time.

## 2013-07-05 NOTE — ED Provider Notes (Signed)
CSN: [id number]     Arrival date & time 07/05/13  1533 History   First MD Initiated Contact with Patient 07/05/13 1535     Chief Complaint  Patient presents with  . Weakness  . Influenza   (Consider location/radiation/quality/duration/timing/severity/associated sxs/prior Treatment) Patient is a 68 y.o. male presenting with general illness. The history is provided by the patient and the EMS personnel.  Illness Severity:  Mild Onset quality:  Gradual Duration:  2 days Timing:  Constant Progression:  Worsening Chronicity:  New Associated symptoms: cough (mild, lasting for quite some time, not worsening), diarrhea, fatigue, nausea and vomiting   Associated symptoms: no abdominal pain, no chest pain, no congestion, no fever, no headaches, no myalgias, no rash and no shortness of breath     Past Medical History  Diagnosis Date  . CHF (congestive heart failure)   . Diabetes mellitus without complication   . Hypertension    Past Surgical History  Procedure Laterality Date  . Cardiac defibrillator placement     Family History  Problem Relation Age of Onset  . Heart disease Father    History  Substance Use Topics  . Smoking status: Never Smoker   . Smokeless tobacco: Not on file  . Alcohol Use: No    Review of Systems  Constitutional: Positive for fatigue. Negative for fever and chills.  HENT: Negative for congestion and facial swelling.   Eyes: Negative for discharge and visual disturbance.  Respiratory: Positive for cough (mild, lasting for quite some time, not worsening). Negative for shortness of breath.   Cardiovascular: Negative for chest pain and palpitations.  Gastrointestinal: Positive for nausea, vomiting and diarrhea. Negative for abdominal pain.  Musculoskeletal: Negative for arthralgias and myalgias.  Skin: Negative for color change and rash.  Neurological: Negative for tremors, syncope and headaches.  Psychiatric/Behavioral: Negative for confusion and dysphoric  mood.    Allergies  Penicillins and Sulfa antibiotics  Home Medications   Current Outpatient Rx  Name  Route  Sig  Dispense  Refill  . aspirin EC 81 MG tablet   Oral   Take 81 mg by mouth daily.         . carvedilol (COREG) 6.25 MG tablet   Oral   Take 6.25 mg by mouth 2 (two) times daily with a meal.         . insulin glargine (LANTUS) 100 UNIT/ML injection   Subcutaneous   Inject 0.25 mLs (25 Units total) into the skin at bedtime.   10 mL   12   . lisinopril (PRINIVIL,ZESTRIL) 5 MG tablet   Oral   Take 1 tablet (5 mg total) by mouth daily.   30 tablet   3   . metolazone (ZAROXOLYN) 5 MG tablet   Oral   Take 1 tablet (5 mg total) by mouth daily as needed.   30 tablet   4   . ondansetron (ZOFRAN) 4 MG tablet   Oral   Take 1 tablet (4 mg total) by mouth every 6 (six) hours as needed for nausea.   20 tablet   0   . pantoprazole (PROTONIX) 20 MG tablet   Oral   Take 1 tablet (20 mg total) by mouth daily.   30 tablet   5   . potassium chloride 20 MEQ TBCR   Oral   Take 20 mEq by mouth daily.   30 tablet   6   . sertraline (ZOLOFT) 50 MG tablet   Oral   Take 1  tablet (50 mg total) by mouth daily.   30 tablet   3   . simvastatin (ZOCOR) 5 MG tablet   Oral   Take 5 mg by mouth at bedtime.         . torsemide (DEMADEX) 20 MG tablet   Oral   Take 3 tablets (60 mg total) by mouth daily.   90 tablet   3   . zolpidem (AMBIEN) 5 MG tablet   Oral   Take 5 mg by mouth at bedtime as needed for sleep.         Marland Kitchen digoxin (LANOXIN) 0.125 MG tablet   Oral   Take 1 tablet (0.125 mg total) by mouth daily.   30 tablet   3    BP 119/70  Pulse 91  Temp(Src) 100.1 F (37.8 C) (Oral)  Resp 18  SpO2 96% Physical Exam  Constitutional: He is oriented to person, place, and time. He appears well-developed and well-nourished.  HENT:  Head: Normocephalic and atraumatic.  Eyes: EOM are normal. Pupils are equal, round, and reactive to light.  Neck:  Normal range of motion. Neck supple. No JVD present.  Cardiovascular: Normal rate and regular rhythm.  Exam reveals no gallop and no friction rub.   No murmur heard. Pulmonary/Chest: No respiratory distress. He has no wheezes. He has no rales.  Diminished   Abdominal: He exhibits no distension. There is no rebound and no guarding.  Musculoskeletal: Normal range of motion.  Neurological: He is alert and oriented to person, place, and time.  Skin: No rash noted. No pallor.  Psychiatric: He has a normal mood and affect. His behavior is normal.    ED Course  Procedures (including critical care time) Labs Review Labs Reviewed  CBC WITH DIFFERENTIAL - Abnormal; Notable for the following:    WBC 12.9 (*)    Hemoglobin 17.3 (*)    Platelets 131 (*)    Neutrophils Relative % 88 (*)    Neutro Abs 11.3 (*)    Lymphocytes Relative 3 (*)    Lymphs Abs 0.4 (*)    Monocytes Absolute 1.1 (*)    All other components within normal limits  COMPREHENSIVE METABOLIC PANEL - Abnormal; Notable for the following:    Chloride 93 (*)    CO2 36 (*)    Glucose, Bld 230 (*)    Alkaline Phosphatase 201 (*)    Total Bilirubin 2.4 (*)    GFR calc non Af Amer 85 (*)    All other components within normal limits  PRO B NATRIURETIC PEPTIDE - Abnormal; Notable for the following:    Pro B Natriuretic peptide (BNP) 14925.0 (*)    All other components within normal limits  INFLUENZA PANEL BY PCR (TYPE A & B, H1N1)  URINALYSIS, ROUTINE W REFLEX MICROSCOPIC  POCT I-STAT TROPONIN I   Imaging Review Ct Chest W Contrast  07/05/2013   CLINICAL DATA:  Nausea, vomiting, diarrhea, congestive heart failure, fever.  EXAM: CT CHEST, ABDOMEN, AND PELVIS WITH CONTRAST  TECHNIQUE: Multidetector CT imaging of the chest, abdomen and pelvis was performed following the standard protocol during bolus administration of intravenous contrast.  CONTRAST:  161m OMNIPAQUE IOHEXOL 300 MG/ML  SOLN  COMPARISON:  None.  FINDINGS: CT CHEST  FINDINGS  There is pacemaker in left chest wall. No axillary supraclavicular lymphadenopathy. No mediastinal hilar lymphadenopathy. There is a small pericardial effusion measuring 10 mm in depth adjacent to the right atrium. The thoracic aorta appears normal. There is no  central pulmonary embolism. No mediastinal lymphadenopathy. Esophagus is normal.  Review of the lung parenchyma demonstrates no pulmonary edema or pleural fluid. Airways are normal.  CT ABDOMEN AND PELVIS FINDINGS  There is a moderate amount of intraperitoneal free fluid along the left and right pericolic gutter and into the pelvis. Additionally there is mild free fluid within the mesentery.  Small enhancing lesion in the left hepatic lobe measures 11 mm (image 56, series 3). The liver may have a fine nodular pattern. The caudate lobe is mildly enlarged. Portal veins are grossly patent. The gallbladder, pancreas, spleen, adrenal glands, and kidneys are normal. Kidneys enhance symmetrically.  The stomach, small bowel, and cecum are normal. Appendix is normal. Several diverticula of the descending colon with acute inflammation. No bowel obstruction.  Abdominal aorta is normal caliber. There is a left retro aortic renal vein. No retroperitoneal periportal lymphadenopathy. No mesenteric adenopathy. No organized fluid collections in the abdomen or pelvis. No intracranial free air.  No abscess within the pelvis. Free-fluid collects within the posterior cul-de-sac. Bladder prostate gland are normal. No pelvic lymphadenopathy. Insert negative then  IMPRESSION: 1. Small pericardial effusion. 2. Cardiomegaly. 3. No acute pulmonary parenchymal abnormality. 4. Moderate volume intraperitoneal free fluid along the left and right pericolic gutter, within mesenteric, and collecting within the pelvis. This likely represents fluid from congestive heart failure or potentially hepatic dysfunction. 5. No abscess in the abdomen or pelvis. 6. No CT evidence of biliary  obstruction. 7. Enlarged caudate lobe and fine nodular contour. Findings concerning for cirrhosis. Additionally there is a enhancing lesion in the left hepatic lobe. When patient can tolerate MRI (preferably as an outpatient when acute symptomology has improved and patient can follow-up breath hold commands), a contrast-enhanced MRI of the abdomen would be the most appropriate exam for evaluating the liver.   Electronically Signed   By: Suzy Bouchard M.D.   On: 07/05/2013 21:25   Ct Abdomen Pelvis W Contrast  07/05/2013   CLINICAL DATA:  Nausea, vomiting, diarrhea, congestive heart failure, fever.  EXAM: CT CHEST, ABDOMEN, AND PELVIS WITH CONTRAST  TECHNIQUE: Multidetector CT imaging of the chest, abdomen and pelvis was performed following the standard protocol during bolus administration of intravenous contrast.  CONTRAST:  164m OMNIPAQUE IOHEXOL 300 MG/ML  SOLN  COMPARISON:  None.  FINDINGS: CT CHEST FINDINGS  There is pacemaker in left chest wall. No axillary supraclavicular lymphadenopathy. No mediastinal hilar lymphadenopathy. There is a small pericardial effusion measuring 10 mm in depth adjacent to the right atrium. The thoracic aorta appears normal. There is no central pulmonary embolism. No mediastinal lymphadenopathy. Esophagus is normal.  Review of the lung parenchyma demonstrates no pulmonary edema or pleural fluid. Airways are normal.  CT ABDOMEN AND PELVIS FINDINGS  There is a moderate amount of intraperitoneal free fluid along the left and right pericolic gutter and into the pelvis. Additionally there is mild free fluid within the mesentery.  Small enhancing lesion in the left hepatic lobe measures 11 mm (image 56, series 3). The liver may have a fine nodular pattern. The caudate lobe is mildly enlarged. Portal veins are grossly patent. The gallbladder, pancreas, spleen, adrenal glands, and kidneys are normal. Kidneys enhance symmetrically.  The stomach, small bowel, and cecum are normal.  Appendix is normal. Several diverticula of the descending colon with acute inflammation. No bowel obstruction.  Abdominal aorta is normal caliber. There is a left retro aortic renal vein. No retroperitoneal periportal lymphadenopathy. No mesenteric adenopathy. No organized fluid collections in  the abdomen or pelvis. No intracranial free air.  No abscess within the pelvis. Free-fluid collects within the posterior cul-de-sac. Bladder prostate gland are normal. No pelvic lymphadenopathy. Insert negative then  IMPRESSION: 1. Small pericardial effusion. 2. Cardiomegaly. 3. No acute pulmonary parenchymal abnormality. 4. Moderate volume intraperitoneal free fluid along the left and right pericolic gutter, within mesenteric, and collecting within the pelvis. This likely represents fluid from congestive heart failure or potentially hepatic dysfunction. 5. No abscess in the abdomen or pelvis. 6. No CT evidence of biliary obstruction. 7. Enlarged caudate lobe and fine nodular contour. Findings concerning for cirrhosis. Additionally there is a enhancing lesion in the left hepatic lobe. When patient can tolerate MRI (preferably as an outpatient when acute symptomology has improved and patient can follow-up breath hold commands), a contrast-enhanced MRI of the abdomen would be the most appropriate exam for evaluating the liver.   Electronically Signed   By: Suzy Bouchard M.D.   On: 07/05/2013 21:25   Dg Chest Portable 1 View  07/05/2013   CLINICAL DATA:  CHF, weakness  EXAM: PORTABLE CHEST - 1 VIEW  COMPARISON:  DG CHEST 2 VIEW dated 02/16/2013  FINDINGS: Left-sided AICD. Enlarged cardiac silhouette. No effusion, infiltrate, or pneumothorax. No pulmonary edema. No pneumothorax.  IMPRESSION: Cardiomegaly without pulmonary edema.   Electronically Signed   By: Suzy Bouchard M.D.   On: 07/05/2013 16:33    EKG Interpretation   None       MDM   1. CHF (congestive heart failure)    Patient is a 68 y.o. male who  presents with Weakness and Influenza .  This started a couple days ago.  Having vomiting mostly at night, associated with some diarrhea.  Patient feeling weak at home, recently dx with CHF with EF of 10%, given metalazone due to recent weight gain.  Patient feeling weak, has had a cough, though not worsening per him.  No noted fevers, chills, headache.  Distended abdomen, patient not sure the duration of time of this. Denies cp, shortness of breath, orthopnea, PND.   Will obtain cxr, ecg, trop, bnp, cmp, ua, rapid flu.   Patient with elevated T bili, Alk phos, will check CT chest/abd/pelvis.    CT with no PE,no pna, mild changes from CHF, with concern for cirrhosis. Will admit for gentle hydration    Deno Etienne, MD 07/05/13 [phone]

## 2013-07-05 NOTE — H&P (Signed)
PCP:  CAMPBELL, Sherran Needs, FNP  [facility]   Chief Complaint:  weakness  HPI: Gary Gallegos is a 68 y.o. male   has a past medical history of CHF (congestive heart failure); Diabetes mellitus without complication; and Hypertension.   Presented with  Patient was doing well 4 days ago his lasix was discontinued and he was switched to torsemide and added potassium. Next day he started to have poor PO intake, he started to have nausea, vomiting, and diarrhea. Patient became profoundly weak. He started to have cough and fever up to 102.1. Reports myalgias. CXR negative for infiltrate. Flu PCR was negative. Hospitalist called for admission.  Patient had a CT scan showed cirrhotic changes of the liver. Family states he has hx of heavy alcoholic use in the past.  nausea, vomiting, diarrhea, family reports coffee ground emesis x 2. Last episode this morning. Denies any blood in stool. patient is so weak he is vomiting and stooling on himself.   Review of Systems:    Pertinent positives include: Fevers, chills, fatigue, dyspnea on exertion,   Constitutional:  No weight loss, night sweats,  weight loss  HEENT:  No headaches, Difficulty swallowing,Tooth/dental problems,Sore throat,  No sneezing, itching, ear ache, nasal congestion, post nasal drip,  Cardio-vascular:  No chest pain, Orthopnea, PND, anasarca, dizziness, palpitations. no Bilateral lower extremity swelling  GI:  No heartburn, indigestion, abdominal pain,change in bowel habits, loss of appetite, melena, blood in stool, hematemesis Resp:  no shortness of breath at rest.   No excess mucus, no productive cough, No non-productive cough, No coughing up of blood.No change in color of mucus.No wheezing. Skin:  no rash or lesions. No jaundice GU:  no dysuria, change in color of urine, no urgency or frequency. No straining to urinate.  No flank pain.  Musculoskeletal:  No joint pain or no joint swelling. No decreased range of  motion. No back pain.  Psych:  No change in mood or affect. No depression or anxiety. No memory loss.  Neuro: no localizing neurological complaints, no tingling, no weakness, no double vision, no gait abnormality, no slurred speech, no confusion  Otherwise ROS are negative except for above, 10 systems were reviewed  Past Medical History: Past Medical History  Diagnosis Date  . CHF (congestive heart failure)   . Diabetes mellitus without complication   . Hypertension    Past Surgical History  Procedure Laterality Date  . Cardiac defibrillator placement       Medications: Prior to Admission medications   Medication Sig Start Date End Date Taking? Authorizing Provider  aspirin EC 81 MG tablet Take 81 mg by mouth daily.   Yes Historical Provider, MD  carvedilol (COREG) 6.25 MG tablet Take 6.25 mg by mouth 2 (two) times daily with a meal.   Yes Historical Provider, MD  insulin glargine (LANTUS) 100 UNIT/ML injection Inject 8 Units into the skin at bedtime. 02/14/13  Yes Shanker Levora Dredge, MD  lisinopril (PRINIVIL,ZESTRIL) 5 MG tablet Take 1 tablet (5 mg total) by mouth daily. 06/09/13  Yes Lars Masson, MD  metolazone (ZAROXOLYN) 5 MG tablet Take 1 tablet (5 mg total) by mouth daily as needed. 06/29/13  Yes Bevelyn Buckles Bensimhon, MD  ondansetron (ZOFRAN) 4 MG tablet Take 1 tablet (4 mg total) by mouth every 6 (six) hours as needed for nausea. 02/14/13  Yes Shanker Levora Dredge, MD  pantoprazole (PROTONIX) 20 MG tablet Take 1 tablet (20 mg total) by mouth daily. 03/16/13  Yes Padonda  Karren Cobble, FNP  potassium chloride 20 MEQ TBCR Take 20 mEq by mouth daily. 07/02/13  Yes Aundria Rud, NP  sertraline (ZOLOFT) 50 MG tablet Take 1 tablet (50 mg total) by mouth daily. 02/26/13  Yes Baker Pierini, FNP  simvastatin (ZOCOR) 5 MG tablet Take 5 mg by mouth at bedtime.   Yes Historical Provider, MD  torsemide (DEMADEX) 20 MG tablet Take 3 tablets (60 mg total) by mouth daily. 07/02/13  Yes Aundria Rud, NP  zolpidem (AMBIEN) 5 MG tablet Take 5 mg by mouth at bedtime as needed for sleep.   Yes Historical Provider, MD  digoxin (LANOXIN) 0.125 MG tablet Take 1 tablet (0.125 mg total) by mouth daily. 06/29/13   Dolores Patty, MD    Allergies:   Allergies  Allergen Reactions  . Penicillins Nausea And Vomiting  . Sulfa Antibiotics Nausea And Vomiting    Social History:  Ambulatory   walker   Lives at  Home with family   reports that he has never smoked. He does not have any smokeless tobacco history on file. He reports that he does not drink alcohol or use illicit drugs.   Family History: family history includes Heart disease in his father.    Physical Exam: Patient Vitals for the past 24 hrs:  BP Temp Temp src Pulse Resp SpO2  07/05/13 2119 119/70 mmHg 100.1 F (37.8 C) Oral 91 18 96 %  07/05/13 1815 120/76 mmHg - - 93 24 92 %  07/05/13 1800 112/80 mmHg - - 94 31 93 %  07/05/13 1745 117/79 mmHg - - 97 35 97 %  07/05/13 1730 120/84 mmHg - - - 29 -  07/05/13 1715 108/80 mmHg - - 102 14 96 %  07/05/13 1700 116/81 mmHg - - 101 24 97 %  07/05/13 1645 107/84 mmHg - - 98 17 99 %  07/05/13 1630 120/75 mmHg - - 94 19 98 %  07/05/13 1551 101/61 mmHg 102.1 F (38.9 C) Oral 97 29 96 %  07/05/13 1546 - - - - - 98 %    1. General:  in No Acute distress 2. Psychological: Alert and   Oriented 3. Head/ENT:     Dry Mucous Membranes                          Head Non traumatic, neck supple                          Normal   Dentition 4. SKIN:  decreased Skin turgor,  Skin clean Dry and intact no rash 5. Heart: Regular rate and rhythm no Murmur, Rub or gallop 6. Lungs: coarse breathsounds no wheezes or crackles   7. Abdomen: Soft, non-tender, slightly distended 8. Lower extremities: no clubbing, cyanosis, or edema 9. Neurologically Grossly intact, moving all 4 extremities equally 10. MSK: Normal range of motion  body mass index is unknown because there is no weight on  file.   Labs on Admission:   Recent Labs  07/05/13 1609  NA 142  K 3.7  CL 93*  CO2 36*  GLUCOSE 230*  BUN 20  CREATININE 0.96  CALCIUM 9.7    Recent Labs  07/05/13 1609  AST 26  ALT 12  ALKPHOS 201*  BILITOT 2.4*  PROT 7.7  ALBUMIN 3.5   No results found for this basename: LIPASE, AMYLASE,  in the last 72 hours  Recent Labs  07/05/13 1609  WBC 12.9*  NEUTROABS 11.3*  HGB 17.3*  HCT 50.0  MCV 91.9  PLT 131*   No results found for this basename: CKTOTAL, CKMB, CKMBINDEX, TROPONINI,  in the last 72 hours No results found for this basename: TSH, T4TOTAL, FREET3, T3FREE, THYROIDAB,  in the last 72 hours No results found for this basename: VITAMINB12, FOLATE, FERRITIN, TIBC, IRON, RETICCTPCT,  in the last 72 hours Lab Results  Component Value Date   HGBA1C 11.0* 02/26/2013    The CrCl is unknown because both a height and weight (above a minimum accepted value) are required for this calculation. ABG    Component Value Date/Time   TCO2 25 02/14/2013 0004     No results found for this basename: DDIMER     Other results:  I have pearsonaly reviewed this: ECG REPORT  Rate: 99   Rhythm: NSR ST&T Change: LVH, early repolarization nonspecific ST segment changes ST up in V2,v3,4 STseg down V4 Evidence of LVH  Cultures:    Component Value Date/Time   SDES BLOOD RIGHT HAND 02/13/2013 2350   SPECREQUEST BOTTLES DRAWN AEROBIC ONLY [facility]2CC 02/13/2013 2350   CULT  Value: NO GROWTH 5 DAYS Performed at [facility] 02/13/2013 2350   REPTSTATUS 02/20/2013 FINAL 02/13/2013 2350       Radiological Exams on Admission: Ct Chest W Contrast  07/05/2013   CLINICAL DATA:  Nausea, vomiting, diarrhea, congestive heart failure, fever.  EXAM: CT CHEST, ABDOMEN, AND PELVIS WITH CONTRAST  TECHNIQUE: Multidetector CT imaging of the chest, abdomen and pelvis was performed following the standard protocol during bolus administration of intravenous contrast.  CONTRAST:  100mL  OMNIPAQUE IOHEXOL 300 MG/ML  SOLN  COMPARISON:  None.  FINDINGS: CT CHEST FINDINGS  There is pacemaker in left chest wall. No axillary supraclavicular lymphadenopathy. No mediastinal hilar lymphadenopathy. There is a small pericardial effusion measuring 10 mm in depth adjacent to the right atrium. The thoracic aorta appears normal. There is no central pulmonary embolism. No mediastinal lymphadenopathy. Esophagus is normal.  Review of the lung parenchyma demonstrates no pulmonary edema or pleural fluid. Airways are normal.  CT ABDOMEN AND PELVIS FINDINGS  There is a moderate amount of intraperitoneal free fluid along the left and right pericolic gutter and into the pelvis. Additionally there is mild free fluid within the mesentery.  Small enhancing lesion in the left hepatic lobe measures 11 mm (image 56, series 3). The liver may have a fine nodular pattern. The caudate lobe is mildly enlarged. Portal veins are grossly patent. The gallbladder, pancreas, spleen, adrenal glands, and kidneys are normal. Kidneys enhance symmetrically.  The stomach, small bowel, and cecum are normal. Appendix is normal. Several diverticula of the descending colon with acute inflammation. No bowel obstruction.  Abdominal aorta is normal caliber. There is a left retro aortic renal vein. No retroperitoneal periportal lymphadenopathy. No mesenteric adenopathy. No organized fluid collections in the abdomen or pelvis. No intracranial free air.  No abscess within the pelvis. Free-fluid collects within the posterior cul-de-sac. Bladder prostate gland are normal. No pelvic lymphadenopathy. Insert negative then  IMPRESSION: 1. Small pericardial effusion. 2. Cardiomegaly. 3. No acute pulmonary parenchymal abnormality. 4. Moderate volume intraperitoneal free fluid along the left and right pericolic gutter, within mesenteric, and collecting within the pelvis. This likely represents fluid from congestive heart failure or potentially hepatic  dysfunction. 5. No abscess in the abdomen or pelvis. 6. No CT evidence of biliary obstruction. 7. Enlarged caudate lobe and  fine nodular contour. Findings concerning for cirrhosis. Additionally there is a enhancing lesion in the left hepatic lobe. When patient can tolerate MRI (preferably as an outpatient when acute symptomology has improved and patient can follow-up breath hold commands), a contrast-enhanced MRI of the abdomen would be the most appropriate exam for evaluating the liver.   Electronically Signed   By: Genevive Bi M.D.   On: 07/05/2013 21:25   Ct Abdomen Pelvis W Contrast  07/05/2013   CLINICAL DATA:  Nausea, vomiting, diarrhea, congestive heart failure, fever.  EXAM: CT CHEST, ABDOMEN, AND PELVIS WITH CONTRAST  TECHNIQUE: Multidetector CT imaging of the chest, abdomen and pelvis was performed following the standard protocol during bolus administration of intravenous contrast.  CONTRAST:  OMNIPAQUE IOHEXOL 300 MG/ML  SOLN  COMPARISON:  None.  FINDINGS: CT CHEST FINDINGS  There is pacemaker in left chest wall. No axillary supraclavicular lymphadenopathy. No mediastinal hilar lymphadenopathy. There is a small pericardial effusion measuring 10 mm in depth adjacent to the right atrium. The thoracic aorta appears normal. There is no central pulmonary embolism. No mediastinal lymphadenopathy. Esophagus is normal.  Review of the lung parenchyma demonstrates no pulmonary edema or pleural fluid. Airways are normal.  CT ABDOMEN AND PELVIS FINDINGS  There is a moderate amount of intraperitoneal free fluid along the left and right pericolic gutter and into the pelvis. Additionally there is mild free fluid within the mesentery.  Small enhancing lesion in the left hepatic lobe measures 11 mm (image 56, series 3). The liver may have a fine nodular pattern. The caudate lobe is mildly enlarged. Portal veins are grossly patent. The gallbladder, pancreas, spleen, adrenal glands, and kidneys are normal.  Kidneys enhance symmetrically.  The stomach, small bowel, and cecum are normal. Appendix is normal. Several diverticula of the descending colon with acute inflammation. No bowel obstruction.  Abdominal aorta is normal caliber. There is a left retro aortic renal vein. No retroperitoneal periportal lymphadenopathy. No mesenteric adenopathy. No organized fluid collections in the abdomen or pelvis. No intracranial free air.  No abscess within the pelvis. Free-fluid collects within the posterior cul-de-sac. Bladder prostate gland are normal. No pelvic lymphadenopathy. Insert negative then  IMPRESSION: 1. Small pericardial effusion. 2. Cardiomegaly. 3. No acute pulmonary parenchymal abnormality. 4. Moderate volume intraperitoneal free fluid along the left and right pericolic gutter, within mesenteric, and collecting within the pelvis. This likely represents fluid from congestive heart failure or potentially hepatic dysfunction. 5. No abscess in the abdomen or pelvis. 6. No CT evidence of biliary obstruction. 7. Enlarged caudate lobe and fine nodular contour. Findings concerning for cirrhosis. Additionally there is a enhancing lesion in the left hepatic lobe. When patient can tolerate MRI (preferably as an outpatient when acute symptomology has improved and patient can follow-up breath hold commands), a contrast-enhanced MRI of the abdomen would be the most appropriate exam for evaluating the liver.   Electronically Signed   By: Genevive Bi M.D.   On: 07/05/2013 21:25   Dg Chest Portable 1 View  07/05/2013   CLINICAL DATA:  CHF, weakness  EXAM: PORTABLE CHEST - 1 VIEW  COMPARISON:  DG CHEST 2 VIEW dated 02/16/2013  FINDINGS: Left-sided AICD. Enlarged cardiac silhouette. No effusion, infiltrate, or pneumothorax. No pulmonary edema. No pneumothorax.  IMPRESSION: Cardiomegaly without pulmonary edema.   Electronically Signed   By: Genevive Bi M.D.   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 Coffee ground emesis - 2 episodes not witnessed by me. Will admit to step down nonetheless given the complexity of patient medical issues, Protonix P. IV 40 mg twice a day, follow CBC, [facility] has been consult followup in a.m.  Given evidence of cirrhosis on  CT scan will obtain INR, and hepatitis panel  . Cardiomyopathy - have discussed with cardiology at this point appears to be dehydrated to hold torsemide and metolazone  . Chronic systolic heart failure - will hold torsemide metolazone avoid fluid overload given abnormal EKG will cycle cardiac markers cardiology consult has been called  . Diabetes mellitus - sliding scale  . HTN (hypertension) - continue coronary  . Nausea vomiting and diarrhea - likely secondary to gastroenteritis will obtain C. difficile PCR nonetheless and put on contact precautions  . Dehydration - hold diuretics if needed gentle boluses  . Gastroenteritis - contact precautions stool culture   Prophylaxis: SCD  , Protonix  CODE STATUS:  FULL CODE   Other plan as per orders.  I have spent a total of 70 min on this admission extra time is taken to discuss care of cardiology and GI excess band on complex medical admission  Ary Rudnick 07/05/2013, 10:53 PM

## 2013-07-05 NOTE — ED Notes (Signed)
Pt arrived by [facility] from home. Having generalized weakness, fatigue, n/v/d, fever, productive cough x 3 days. Mask on pt on arrival.

## 2013-07-06 ENCOUNTER — Encounter (HOSPITAL_COMMUNITY): Payer: Medicare Other

## 2013-07-06 DIAGNOSIS — K5289 Other specified noninfective gastroenteritis and colitis: Secondary | ICD-10-CM

## 2013-07-06 DIAGNOSIS — I428 Other cardiomyopathies: Secondary | ICD-10-CM

## 2013-07-06 DIAGNOSIS — R160 Hepatomegaly, not elsewhere classified: Secondary | ICD-10-CM | POA: Diagnosis present

## 2013-07-06 DIAGNOSIS — K7460 Unspecified cirrhosis of liver: Secondary | ICD-10-CM

## 2013-07-06 DIAGNOSIS — I509 Heart failure, unspecified: Secondary | ICD-10-CM

## 2013-07-06 LAB — GLUCOSE, CAPILLARY
GLUCOSE-CAPILLARY: 125 mg/dL — AB (ref 70–99)
GLUCOSE-CAPILLARY: 116 mg/dL — AB (ref 70–99)
GLUCOSE-CAPILLARY: 109 mg/dL — AB (ref 70–99)
Glucose-Capillary: 175 mg/dL — ABNORMAL HIGH (ref 70–99)
Glucose-Capillary: 162 mg/dL — ABNORMAL HIGH (ref 70–99)
Glucose-Capillary: 184 mg/dL — ABNORMAL HIGH (ref 70–99)

## 2013-07-06 LAB — CBC
HCT: 48.6 % (ref 39.0–52.0)
HEMATOCRIT: 46.1 % (ref 39.0–52.0)
HEMATOCRIT: 46.1 % (ref 39.0–52.0)
HEMOGLOBIN: 17.3 g/dL — AB (ref 13.0–17.0)
HEMOGLOBIN: 16.3 g/dL (ref 13.0–17.0)
Hemoglobin: 16.6 g/dL (ref 13.0–17.0)
MCH: 32 pg (ref 26.0–34.0)
MCH: 32.6 pg (ref 26.0–34.0)
MCH: 32.1 pg (ref 26.0–34.0)
MCHC: 35.6 g/dL (ref 30.0–36.0)
MCHC: 36 g/dL (ref 30.0–36.0)
MCHC: 35.4 g/dL (ref 30.0–36.0)
MCV: 90 fL (ref 78.0–100.0)
MCV: 90.6 fL (ref 78.0–100.0)
MCV: 90.7 fL (ref 78.0–100.0)
PLATELETS: 118 10*3/uL — AB (ref 150–400)
Platelets: 114 10*3/uL — ABNORMAL LOW (ref 150–400)
Platelets: 119 10*3/uL — ABNORMAL LOW (ref 150–400)
RBC: 5.4 MIL/uL (ref 4.22–5.81)
RBC: 5.09 MIL/uL (ref 4.22–5.81)
RBC: 5.08 MIL/uL (ref 4.22–5.81)
RDW: 15.1 % (ref 11.5–15.5)
RDW: 14.9 % (ref 11.5–15.5)
RDW: 14.8 % (ref 11.5–15.5)
WBC: 9.3 10*3/uL (ref 4.0–10.5)
WBC: 9.4 10*3/uL (ref 4.0–10.5)
WBC: 7.6 10*3/uL (ref 4.0–10.5)

## 2013-07-06 LAB — TSH: TSH: 2.323 u[IU]/mL (ref 0.350–4.500)

## 2013-07-06 LAB — COMPREHENSIVE METABOLIC PANEL
ALBUMIN: 3.4 g/dL — AB (ref 3.5–5.2)
ALT: 10 U/L (ref 0–53)
AST: 26 U/L (ref 0–37)
Alkaline Phosphatase: 192 U/L — ABNORMAL HIGH (ref 39–117)
BUN: 19 mg/dL (ref 6–23)
CALCIUM: 9.2 mg/dL (ref 8.4–10.5)
CO2: 32 meq/L (ref 19–32)
Chloride: 91 mEq/L — ABNORMAL LOW (ref 96–112)
Creatinine, Ser: 0.85 mg/dL (ref 0.50–1.35)
GFR calc Af Amer: >90 mL/min (ref >90)
GFR calc non Af Amer: 89 mL/min — ABNORMAL LOW (ref >90)
Glucose, Bld: 177 mg/dL — ABNORMAL HIGH (ref 70–99)
Potassium: 3.2 mEq/L — ABNORMAL LOW (ref 3.7–5.3)
SODIUM: 138 meq/L (ref 137–147)
Total Bilirubin: 2.5 mg/dL — ABNORMAL HIGH (ref 0.3–1.2)
Total Protein: 7.4 g/dL (ref 6.0–8.3)

## 2013-07-06 LAB — TROPONIN I
Troponin I: <0.3 ng/mL (ref <0.30)
Troponin I: <0.3 ng/mL (ref <0.30)

## 2013-07-06 LAB — PROTIME-INR
INR: 1.47 (ref 0.00–1.49)
Prothrombin Time: 17.4 seconds — ABNORMAL HIGH (ref 11.6–15.2)

## 2013-07-06 LAB — HEPATITIS PANEL, ACUTE
HCV AB: NEGATIVE
HEP A IGM: NONREACTIVE
HEP B C IGM: NONREACTIVE
Hepatitis B Surface Ag: NEGATIVE

## 2013-07-06 LAB — MAGNESIUM: Magnesium: 1.2 mg/dL — ABNORMAL LOW (ref 1.5–2.5)

## 2013-07-06 LAB — MRSA PCR SCREENING: MRSA by PCR: NEGATIVE

## 2013-07-06 LAB — HEMOGLOBIN A1C
Hgb A1c MFr Bld: 10.9 % — ABNORMAL HIGH (ref <5.7)
MEAN PLASMA GLUCOSE: 266 mg/dL — AB (ref <117)

## 2013-07-06 LAB — PHOSPHORUS: PHOSPHORUS: 3.7 mg/dL (ref 2.3–4.6)

## 2013-07-06 MED ORDER — SERTRALINE HCL 50 MG PO TABS
50.0000 mg | ORAL_TABLET | Freq: Every day | ORAL | Status: DC
  Administered 2013-07-06 – 2013-07-11 (×6): 50 mg via ORAL
  Filled 2013-07-06 (×6): qty 1

## 2013-07-06 MED ORDER — GUAIFENESIN ER 600 MG PO TB12
600.0000 mg | ORAL_TABLET | Freq: Two times a day (BID) | ORAL | Status: DC
  Administered 2013-07-06 – 2013-07-11 (×11): 600 mg via ORAL
  Filled 2013-07-06 (×13): qty 1

## 2013-07-06 MED ORDER — ACETAMINOPHEN 325 MG PO TABS: 650.0000 mg | ORAL_TABLET | Freq: Four times a day (QID) | ORAL | Status: DC | PRN

## 2013-07-06 MED ORDER — SODIUM CHLORIDE 0.9 % IV SOLN: 250.0000 mL | INTRAVENOUS | Status: DC | PRN

## 2013-07-06 MED ORDER — INSULIN ASPART 100 UNIT/ML ~~LOC~~ SOLN
0.0000 [IU] | SUBCUTANEOUS | Status: DC
  Administered 2013-07-06 (×2): 2 [IU] via SUBCUTANEOUS

## 2013-07-06 MED ORDER — ACETAMINOPHEN 650 MG RE SUPP: 650.0000 mg | Freq: Four times a day (QID) | RECTAL | Status: DC | PRN

## 2013-07-06 MED ORDER — CARVEDILOL 6.25 MG PO TABS
6.2500 mg | ORAL_TABLET | Freq: Two times a day (BID) | ORAL | Status: DC
  Administered 2013-07-06 – 2013-07-09 (×7): 6.25 mg via ORAL
  Filled 2013-07-06 (×11): qty 1

## 2013-07-06 MED ORDER — DIGOXIN 125 MCG PO TABS
0.1250 mg | ORAL_TABLET | Freq: Every day | ORAL | Status: DC
  Administered 2013-07-06: 0.125 mg via ORAL
  Filled 2013-07-06: qty 1

## 2013-07-06 MED ORDER — GUAIFENESIN-DM 100-10 MG/5ML PO SYRP
5.0000 mL | ORAL_SOLUTION | ORAL | Status: DC | PRN
  Filled 2013-07-06: qty 5

## 2013-07-06 MED ORDER — ZOLPIDEM TARTRATE 5 MG PO TABS
5.0000 mg | ORAL_TABLET | Freq: Every evening | ORAL | Status: DC | PRN
  Filled 2013-07-06: qty 1

## 2013-07-06 MED ORDER — ONDANSETRON HCL 4 MG/2ML IJ SOLN
4.0000 mg | Freq: Four times a day (QID) | INTRAMUSCULAR | Status: DC | PRN
  Administered 2013-07-07: 4 mg via INTRAVENOUS
  Filled 2013-07-06: qty 2

## 2013-07-06 MED ORDER — SODIUM CHLORIDE 0.9 % IJ SOLN
3.0000 mL | Freq: Two times a day (BID) | INTRAMUSCULAR | Status: DC
  Administered 2013-07-06: 3 mL via INTRAVENOUS

## 2013-07-06 MED ORDER — SODIUM CHLORIDE 0.9 % IJ SOLN: 3.0000 mL | INTRAMUSCULAR | Status: DC | PRN

## 2013-07-06 MED ORDER — INSULIN ASPART 100 UNIT/ML ~~LOC~~ SOLN
0.0000 [IU] | Freq: Three times a day (TID) | SUBCUTANEOUS | Status: DC
  Administered 2013-07-07: 2 [IU] via SUBCUTANEOUS
  Administered 2013-07-07: 1 [IU] via SUBCUTANEOUS
  Administered 2013-07-07 – 2013-07-08 (×2): 2 [IU] via SUBCUTANEOUS
  Administered 2013-07-09: 5 [IU] via SUBCUTANEOUS
  Administered 2013-07-09: 3 [IU] via SUBCUTANEOUS
  Administered 2013-07-10: 1 [IU] via SUBCUTANEOUS
  Administered 2013-07-10 – 2013-07-11 (×3): 2 [IU] via SUBCUTANEOUS
  Administered 2013-07-11: 3 [IU] via SUBCUTANEOUS
  Administered 2013-07-11: 5 [IU] via SUBCUTANEOUS

## 2013-07-06 MED ORDER — PNEUMOCOCCAL VAC POLYVALENT 25 MCG/0.5ML IJ INJ
0.5000 mL | INJECTION | INTRAMUSCULAR | Status: AC
  Administered 2013-07-07: 0.5 mL via INTRAMUSCULAR
  Filled 2013-07-06: qty 0.5

## 2013-07-06 MED ORDER — POTASSIUM CHLORIDE CRYS ER 20 MEQ PO TBCR
40.0000 meq | EXTENDED_RELEASE_TABLET | Freq: Once | ORAL | Status: AC
  Administered 2013-07-06: 40 meq via ORAL
  Filled 2013-07-06: qty 2

## 2013-07-06 MED ORDER — SIMVASTATIN 5 MG PO TABS
5.0000 mg | ORAL_TABLET | Freq: Every day | ORAL | Status: DC
  Administered 2013-07-06 – 2013-07-11 (×6): 5 mg via ORAL
  Filled 2013-07-06 (×7): qty 1

## 2013-07-06 MED ORDER — LISINOPRIL 5 MG PO TABS
5.0000 mg | ORAL_TABLET | Freq: Every day | ORAL | Status: DC
  Administered 2013-07-06 – 2013-07-07 (×2): 5 mg via ORAL
  Filled 2013-07-06 (×3): qty 1

## 2013-07-06 MED ORDER — HYDROCODONE-ACETAMINOPHEN 5-325 MG PO TABS: 1.0000 | ORAL_TABLET | ORAL | Status: DC | PRN

## 2013-07-06 MED ORDER — PANTOPRAZOLE SODIUM 40 MG IV SOLR
40.0000 mg | Freq: Two times a day (BID) | INTRAVENOUS | Status: DC
  Administered 2013-07-06 (×3): 40 mg via INTRAVENOUS
  Filled 2013-07-06 (×5): qty 40

## 2013-07-06 MED ORDER — ONDANSETRON HCL 4 MG PO TABS: 4.0000 mg | ORAL_TABLET | Freq: Four times a day (QID) | ORAL | Status: DC | PRN

## 2013-07-06 NOTE — ED Provider Notes (Signed)
I saw and evaluated the patient, reviewed the resident's note and I agree with the findings and plan. If applicable, I agree with the resident's interpretation of the EKG.  If applicable, I was present for critical portions of any procedures performed.  Severe CHF with EF 10% with 2 day history of weakness, bodyaches, fever to 102, cough, congestion. No distress, diminished breath sounds with +JVD.   Appears mildly volume down.  Hold diuretics, gentle hydration.  EKG Interpretation    Date/Time:    Ventricular Rate:    PR Interval:    QRS Duration:   QT Interval:    QTC Calculation:   R Axis:     Text Interpretation:                Glynn Octave, MD 07/06/13 1025

## 2013-07-06 NOTE — Consult Note (Addendum)
CARDIOLOGY CONSULT NOTE  Patient ID: Gary Gallegos  MRN: [id number]  DOB: 06/01/1946  Admit date: 07/05/2013 Date of Consult: 07/06/2013  Primary Physician: Janell QuietAMPBELL, PADONDA BOYD, FNP Primary Cardiologist: Arvilla Meresaniel Bensimhon Primary Electrophysiologist:  None  Reason for Consultation: Cardiomyopathy  History of Present Illness: Gary Pattenhomas Escalera is a 68 y.o. 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  He was admitted last night to the medicine service with N/V/D, fever, myalgias, and coffee ground emesis.  Feels very weak, stooling on himself.   He had been feeling well 3 days prior at follow up visit with Dr. Gala RomneyBensimhon, but was found to be 12 pounds up from his baseline weight, felt to be due to eating foods high in salt.  Lasix changed to torsemide and metolozone added.      Past Medical History  Diagnosis Date  . CHF (congestive heart failure)   . Diabetes mellitus without complication   . Hypertension     Past Surgical History  Procedure Laterality Date  . Cardiac defibrillator placement       Home Meds: Prior to Admission medications   Medication Sig Start Date End Date Taking? Authorizing Provider  aspirin EC 81 MG tablet Take 81 mg by mouth daily.   Yes Historical Provider, MD  carvedilol (COREG) 6.25 MG tablet Take 6.25 mg by mouth 2 (two) times daily with a meal.   Yes Historical Provider, MD  insulin glargine (LANTUS) 100 UNIT/ML injection Inject 8 Units into the skin at bedtime. 02/14/13  Yes Shanker Levora DredgeM Ghimire, MD  lisinopril (PRINIVIL,ZESTRIL) 5 MG tablet Take 1 tablet (5 mg total) by mouth daily. 06/09/13  Yes Lars MassonKatarina H Nelson, MD  metolazone (ZAROXOLYN) 5 MG tablet Take 1 tablet (5 mg total) by mouth daily as needed. 06/29/13  Yes Bevelyn Bucklesaniel R Bensimhon, MD  ondansetron (ZOFRAN) 4 MG tablet Take 1 tablet (4 mg total) by mouth every 6 (six) hours as needed for nausea. 02/14/13  Yes Shanker Levora DredgeM Ghimire,  MD  pantoprazole (PROTONIX) 20 MG tablet Take 1 tablet (20 mg total) by mouth daily. 03/16/13  Yes Baker PieriniPadonda B Campbell, FNP  potassium chloride 20 MEQ TBCR Take 20 mEq by mouth daily. 07/02/13  Yes Aundria RudAli B Cosgrove, NP  sertraline (ZOLOFT) 50 MG tablet Take 1 tablet (50 mg total) by mouth daily. 02/26/13  Yes Baker PieriniPadonda B Campbell, FNP  simvastatin (ZOCOR) 5 MG tablet Take 5 mg by mouth at bedtime.   Yes Historical Provider, MD  torsemide (DEMADEX) 20 MG tablet Take 3 tablets (60 mg total) by mouth daily. 07/02/13  Yes Aundria RudAli B Cosgrove, NP  zolpidem (AMBIEN) 5 MG tablet Take 5 mg by mouth at bedtime as needed for sleep.   Yes Historical Provider, MD  digoxin (LANOXIN) 0.125 MG tablet Take 1 tablet (0.125 mg total) by mouth daily. 06/29/13   Dolores Pattyaniel R Bensimhon, MD    Current Medications: . carvedilol  6.25 mg Oral BID WC  . digoxin  0.125 mg Oral Daily  . guaiFENesin  600 mg Oral BID  . insulin aspart  0-9 Units Subcutaneous Q4H  . insulin glargine  5 Units Subcutaneous QHS  . lisinopril  5 mg Oral Daily  . pantoprazole (PROTONIX) IV  40 mg Intravenous Q12H  . [START ON 07/07/2013] pneumococcal 23 valent vaccine  0.5 mL Intramuscular Tomorrow-1000  . sertraline  50 mg Oral Daily  . simvastatin  5 mg Oral QHS  . sodium  chloride  3 mL Intravenous Q12H  . sodium chloride  3 mL Intravenous Q12H     Allergies:    Allergies  Allergen Reactions  . Penicillins Nausea And Vomiting  . Sulfa Antibiotics Nausea And Vomiting    Social History:   The patient  reports that he has never smoked. He does not have any smokeless tobacco history on file. He reports that he does not drink alcohol or use illicit drugs.    Family History:   The patient's family history includes Heart disease in his father.   ROS:  Please see the history of present illness.   All other systems reviewed and negative.   Vital Signs: Blood pressure 110/74, pulse 99, temperature 97.6 F (36.4 C), temperature source Oral, resp. rate 25,  height 5\' 11"  (1.803 m), weight 63.5 kg (139 lb 15.9 oz), SpO2 99.00%.   PHYSICAL EXAM: General:  Resting in bed, NAD HEENT: normal Lymph: no adenopathy Neck: JVP ~ 8cm with + HJR Endocrine:  No thryomegaly Cardiac:  normal S1, S2; + S3.  RRR; no murmur.  Warm and well perfused. Lungs:  clear to auscultation bilaterally Abd: soft, nontender, no hepatomegaly Ext: no edema Musculoskeletal:  No deformities, BUE and BLE strength normal and equal Skin: warm and dry Neuro:  CNs 2-12 intact, no focal abnormalities noted Psych:  Normal affect   EKG:  Sinus tachy, TWI v6 old  Labs:  Recent Labs  07/06/13 0220  TROPONINI <0.30   Lab Results  Component Value Date   WBC 9.3 07/06/2013   HGB 17.3* 07/06/2013   HCT 48.6 07/06/2013   MCV 90.0 07/06/2013   PLT 114* 07/06/2013    Recent Labs Lab 07/06/13 0220  NA 138  K 3.2*  CL 91*  CO2 32  BUN 19  CREATININE 0.85  CALCIUM 9.2  PROT 7.4  BILITOT 2.5*  ALKPHOS 192*  ALT 10  AST 26  GLUCOSE 177*   Lab Results  Component Value Date   CHOL 81 05/05/2013   HDL 28.10* 05/05/2013   LDLCALC 41 05/05/2013   TRIG 58.0 05/05/2013   No results found for this basename: DDIMER    Radiology/Studies:  Ct Chest W Contrast  07/05/2013   CLINICAL DATA:  Nausea, vomiting, diarrhea, congestive heart failure, fever.  EXAM: CT CHEST, ABDOMEN, AND PELVIS WITH CONTRAST  TECHNIQUE: Multidetector CT imaging of the chest, abdomen and pelvis was performed following the standard protocol during bolus administration of intravenous contrast.  CONTRAST:  OMNIPAQUE IOHEXOL 300 MG/ML  SOLN  COMPARISON:  None.  FINDINGS: CT CHEST FINDINGS  There is pacemaker in left chest wall. No axillary supraclavicular lymphadenopathy. No mediastinal hilar lymphadenopathy. There is a small pericardial effusion measuring 10 mm in depth adjacent to the right atrium. The thoracic aorta appears normal. There is no central pulmonary embolism. No mediastinal  lymphadenopathy. Esophagus is normal.  Review of the lung parenchyma demonstrates no pulmonary edema or pleural fluid. Airways are normal.  CT ABDOMEN AND PELVIS FINDINGS  There is a moderate amount of intraperitoneal free fluid along the left and right pericolic gutter and into the pelvis. Additionally there is mild free fluid within the mesentery.  Small enhancing lesion in the left hepatic lobe measures 11 mm (image 56, series 3). The liver may have a fine nodular pattern. The caudate lobe is mildly enlarged. Portal veins are grossly patent. The gallbladder, pancreas, spleen, adrenal glands, and kidneys are normal. Kidneys enhance symmetrically.  The stomach, small bowel, and  cecum are normal. Appendix is normal. Several diverticula of the descending colon with acute inflammation. No bowel obstruction.  Abdominal aorta is normal caliber. There is a left retro aortic renal vein. No retroperitoneal periportal lymphadenopathy. No mesenteric adenopathy. No organized fluid collections in the abdomen or pelvis. No intracranial free air.  No abscess within the pelvis. Free-fluid collects within the posterior cul-de-sac. Bladder prostate gland are normal. No pelvic lymphadenopathy. Insert negative then  IMPRESSION: 1. Small pericardial effusion. 2. Cardiomegaly. 3. No acute pulmonary parenchymal abnormality. 4. Moderate volume intraperitoneal free fluid along the left and right pericolic gutter, within mesenteric, and collecting within the pelvis. This likely represents fluid from congestive heart failure or potentially hepatic dysfunction. 5. No abscess in the abdomen or pelvis. 6. No CT evidence of biliary obstruction. 7. Enlarged caudate lobe and fine nodular contour. Findings concerning for cirrhosis. Additionally there is a enhancing lesion in the left hepatic lobe. When patient can tolerate MRI (preferably as an outpatient when acute symptomology has improved and patient can follow-up breath hold commands), a  contrast-enhanced MRI of the abdomen would be the most appropriate exam for evaluating the liver.   Electronically Signed   By: Genevive Bi M.D.   On: 07/05/2013 21:25   Ct Abdomen Pelvis W Contrast  07/05/2013   CLINICAL DATA:  Nausea, vomiting, diarrhea, congestive heart failure, fever.  EXAM: CT CHEST, ABDOMEN, AND PELVIS WITH CONTRAST  TECHNIQUE: Multidetector CT imaging of the chest, abdomen and pelvis was performed following the standard protocol during bolus administration of intravenous contrast.  CONTRAST:  OMNIPAQUE IOHEXOL 300 MG/ML  SOLN  COMPARISON:  None.  FINDINGS: CT CHEST FINDINGS  There is pacemaker in left chest wall. No axillary supraclavicular lymphadenopathy. No mediastinal hilar lymphadenopathy. There is a small pericardial effusion measuring 10 mm in depth adjacent to the right atrium. The thoracic aorta appears normal. There is no central pulmonary embolism. No mediastinal lymphadenopathy. Esophagus is normal.  Review of the lung parenchyma demonstrates no pulmonary edema or pleural fluid. Airways are normal.  CT ABDOMEN AND PELVIS FINDINGS  There is a moderate amount of intraperitoneal free fluid along the left and right pericolic gutter and into the pelvis. Additionally there is mild free fluid within the mesentery.  Small enhancing lesion in the left hepatic lobe measures 11 mm (image 56, series 3). The liver may have a fine nodular pattern. The caudate lobe is mildly enlarged. Portal veins are grossly patent. The gallbladder, pancreas, spleen, adrenal glands, and kidneys are normal. Kidneys enhance symmetrically.  The stomach, small bowel, and cecum are normal. Appendix is normal. Several diverticula of the descending colon with acute inflammation. No bowel obstruction.  Abdominal aorta is normal caliber. There is a left retro aortic renal vein. No retroperitoneal periportal lymphadenopathy. No mesenteric adenopathy. No organized fluid collections in the abdomen or pelvis.  No intracranial free air.  No abscess within the pelvis. Free-fluid collects within the posterior cul-de-sac. Bladder prostate gland are normal. No pelvic lymphadenopathy. Insert negative then  IMPRESSION: 1. Small pericardial effusion. 2. Cardiomegaly. 3. No acute pulmonary parenchymal abnormality. 4. Moderate volume intraperitoneal free fluid along the left and right pericolic gutter, within mesenteric, and collecting within the pelvis. This likely represents fluid from congestive heart failure or potentially hepatic dysfunction. 5. No abscess in the abdomen or pelvis. 6. No CT evidence of biliary obstruction. 7. Enlarged caudate lobe and fine nodular contour. Findings concerning for cirrhosis. Additionally there is a enhancing lesion in the left hepatic lobe.  When patient can tolerate MRI (preferably as an outpatient when acute symptomology has improved and patient can follow-up breath hold commands), a contrast-enhanced MRI of the abdomen would be the most appropriate exam for evaluating the liver.   Electronically Signed   By: Genevive Bi M.D.   On: 07/05/2013 21:25   Dg Chest Portable 1 View  07/05/2013   CLINICAL DATA:  CHF, weakness  EXAM: PORTABLE CHEST - 1 VIEW  COMPARISON:  DG CHEST 2 VIEW dated 02/16/2013  FINDINGS: Left-sided AICD. Enlarged cardiac silhouette. No effusion, infiltrate, or pneumothorax. No pulmonary edema. No pneumothorax.  IMPRESSION: Cardiomegaly without pulmonary edema.   Electronically Signed   By: Genevive Bi M.D.   On: 07/05/2013 16:33    ASSESSMENT AND PLAN:  1. 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 On exam appears euvolemic to slightly volume up, so would not give IVF.  Encourage PO intake - Can restart coreg, lisinopril when tolerating PO (OK if he misses today's dose).  2. Coffee ground emesis - GI following.  Hopefully 2/2 to irritation from vomiting.  Hgb stable.    Kelly Splinter  07/06/2013 4:39 AM    Chart reviewed. Patient well known to [place] from [facility]. Volume status way down in setting of change in diuretics and acute gastroenteritis. Agree with continuing to hold diuretics and HF meds for now. Can restart as he improves. Renal function stable. The HF team will follow.   Daniel Bensimhon,MD 9:02 AM

## 2013-07-06 NOTE — Progress Notes (Signed)
Utilization review completed.  

## 2013-07-06 NOTE — Consult Note (Signed)
[facility] Consult: 10:04 AM 07/06/2013  LOS: 1 day    Referring Provider: Dr Roel Cluck Primary Care Physician:  Donia Ast, FNP at [facility]  Primary Gastroenterologist:  unassigned    Reason for Consultation:  CG emesis, likely cirrhosis is new diagnoses.    HPI: Gary Gallegos is a 68 y.o. male with a history of DM2 with severe diabetic retinopathy, cataracts.  He is blind.  Hx HTN and NICM (right and left). EF 10% (11/14) s/p ICD [employer].  On new CHF office visit 06/29/12 his weight was up 12 #.  Lasix was discontinued and started on Metolazone and Torsemide.  OV visit note mentions potentially needing home inotropes and that he is not transplant or VAD candidate.  After this his became anorexic.  He says he has had intermittent N/V/D with non-bloody emesis every other day and loose/watery stools ever other day for last 3 weeks.  Family reports CG emesis however. Sxs per his story have not worsened.   Pt became fecal incontinent.  Admitted yesterday with these issues.   A CT scan shows intraperitoneal fluid, nodular cirrhotic appearing liver and a lesion in the left lobe of liver.  MRI suggested to assess the lesion, when pt able to participate for this.   There is no previous hx of liver disease.  Not clear how long ago he stopped drinking but family says he was heavy drinker.  He admits to 6 pack of beer daily.   Alk phos was elevated during Sept 2014 admission and remains elevated.  Treated then for aspiration PNA, treated with Clindamycin.  No NSAIDs except 81 ASA at home.      Past Medical History  Diagnosis Date  . CHF (congestive heart failure)   . Diabetes mellitus without complication   . Hypertension     Past Surgical History  Procedure Laterality Date  . Cardiac  defibrillator placement      Prior to Admission medications   Medication Sig Start Date End Date Taking? Authorizing Provider  aspirin EC 81 MG tablet Take 81 mg by mouth daily.   Yes Historical Provider, MD  carvedilol (COREG) 6.25 MG tablet Take 6.25 mg by mouth 2 (two) times daily with a meal.   Yes Historical Provider, MD  insulin glargine (LANTUS) 100 UNIT/ML injection Inject 8 Units into the skin at bedtime. 02/14/13  Yes Shanker Kristeen Mans, MD  lisinopril (PRINIVIL,ZESTRIL) 5 MG tablet Take 1 tablet (5 mg total) by mouth daily. 06/09/13  Yes Dorothy Spark, MD  metolazone (ZAROXOLYN) 5 MG tablet Take 1 tablet (5 mg total) by mouth daily as needed. 06/29/13  Yes Shaune Pascal Bensimhon, MD  ondansetron (ZOFRAN) 4 MG tablet Take 1 tablet (4 mg total) by mouth every 6 (six) hours as needed for nausea. 02/14/13  Yes Shanker Kristeen Mans, MD  pantoprazole (PROTONIX) 20 MG tablet Take 1 tablet (20 mg total) by mouth daily. 03/16/13  Yes Timoteo Gaul, FNP  potassium chloride 20 MEQ TBCR Take 20 mEq by mouth daily. 07/02/13  Yes Elwyn Reach  Boyce Medici, NP  sertraline (ZOLOFT) 50 MG tablet Take 1 tablet (50 mg total) by mouth daily. 02/26/13  Yes Timoteo Gaul, FNP  simvastatin (ZOCOR) 5 MG tablet Take 5 mg by mouth at bedtime.   Yes Historical Provider, MD  torsemide (DEMADEX) 20 MG tablet Take 3 tablets (60 mg total) by mouth daily. 07/02/13  Yes Rande Brunt, NP  zolpidem (AMBIEN) 5 MG tablet Take 5 mg by mouth at bedtime as needed for sleep.   Yes Historical Provider, MD  digoxin (LANOXIN) 0.125 MG tablet Take 1 tablet (0.125 mg total) by mouth daily. 06/29/13   Jolaine Artist, MD    Scheduled Meds: . carvedilol  6.25 mg Oral BID WC  . digoxin  0.125 mg Oral Daily  . guaiFENesin  600 mg Oral BID  . insulin aspart  0-9 Units Subcutaneous Q4H  . insulin glargine  5 Units Subcutaneous QHS  . lisinopril  5 mg Oral Daily  . pantoprazole (PROTONIX) IV  40 mg Intravenous Q12H  . [START ON  07/07/2013] pneumococcal 23 valent vaccine  0.5 mL Intramuscular Tomorrow-1000  . sertraline  50 mg Oral Daily  . simvastatin  5 mg Oral QHS  . sodium chloride  3 mL Intravenous Q12H  . sodium chloride  3 mL Intravenous Q12H   Infusions:   PRN Meds: sodium chloride, acetaminophen, acetaminophen, guaiFENesin-dextromethorphan, HYDROcodone-acetaminophen, ondansetron (ZOFRAN) IV, ondansetron, sodium chloride, zolpidem   Allergies as of 07/05/2013 - Review Complete 07/05/2013  Allergen Reaction Noted  . Penicillins Nausea And Vomiting 02/13/2013  . Sulfa antibiotics Nausea And Vomiting 02/13/2013    Family History  Problem Relation Age of Onset  . Heart disease Father     History   Social History  . Marital Status: Widowed    Spouse Name: N/A    Number of Children: N/A  . Years of Education: N/A   Occupational History  . 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 Smoking status: Never Smoker   . Smokeless tobacco: Not on file  . Alcohol Use: No  . Drug Use: No  . Sexual Activity: Not on file   Social History Narrative  . Living with daughter in [place] for several months.      REVIEW OF SYSTEMS: Constitutional:  Recent weight gain per HPI ENT:  No nose bleeds Pulm:  No new dyspnea or cough CV:  No palpitations, + LE edema. No chest pain GU:  No hematuria, no frequency.  + incontinence.  GI:  No dysphagia.  Appetite poor Heme:  No hx anemia or bleeding problems   Transfusions:  none Neuro:  No headaches, no peripheral tingling or numbness Derm:  No itching, no rash or sores.  Endocrine:  No sweats or chills.  No polyuria or dysuria Immunization:  Current on flu shot.  Travel:  None beyond local counties in last few months.    PHYSICAL EXAM: Vital signs in last 24 hours: Filed Vitals:   07/06/13 0730  BP: 112/75  Pulse:   Temp:   Resp:    Wt Readings from Last 3 Encounters:  07/06/13 63.5 kg (139 lb 15.9 oz)  07/02/13 73.369  kg (161 lb 12 oz)  06/29/13 67.132 kg (148 lb)    General: cachectic, chronically ill appearing and lethargic older man. comfortable Head:  No asymmetry.  + temporal wasting   Eyes:  No icterus or pallor  Ears:  Not HOH  Nose:  No discharge  or congestion Mouth:  Clear and pink oral MM.  Edentulous.  Neck:  No mass.  8 Cm JVD.  No TMG Lungs:  Clear bil.  No cough or labored breathing Heart: RRR.  No MRG Abdomen:  Soft, ND, NT.  No HSM or mass.  No bruits.  .   Rectal: yellow brown stool is fob negative.  No mass. coomodious rectal vault .  Rectal tone normal.  GU:  condom cath in place, circumcised.  Urine is yellow, clear.  Patches of amber staning on upper inner groin, does not wah off Musc/Skeltl: arthritic changes in knees.  Muscle wasting in limbs.  Extremities:  No pedal edema.  Neurologic:  No tremor, no asterixis.  Oriented x 3.  Appropriate.  Moves all 4s.  Skin:  No jaundice,  No telangectasia Tattoos:  None seen Nodes:  No inguinal or cervical adenpathy   Psych:  Cooperative, relaxed, lethargic.   Intake/Output from previous day: 01/18 0701 - 01/19 0700 In: -  Out: 425 [Urine:425] Intake/Output this shift:    LAB RESULTS:  Recent Labs  07/05/13 1609 07/06/13 0220 07/06/13 0840  WBC 12.9* 9.3 9.4  HGB 17.3* 17.3* 16.6  HCT 50.0 48.6 46.1  PLT 131* 114* 118*  MCV                                     90  BMET Lab Results  Component Value Date   NA 138 07/06/2013   NA 142 07/05/2013   NA 140 07/02/2013   K 3.2* 07/06/2013   K 3.7 07/05/2013   K 3.9 07/02/2013   CL 91* 07/06/2013   CL 93* 07/05/2013   CL 100 07/02/2013   CO2 32 07/06/2013   CO2 36* 07/05/2013   CO2 27 07/02/2013   GLUCOSE 177* 07/06/2013   GLUCOSE 230* 07/05/2013   GLUCOSE 140* 07/02/2013   BUN 19 07/06/2013   BUN 20 07/05/2013   BUN 21 07/02/2013   CREATININE 0.85 07/06/2013   CREATININE 0.96 07/05/2013   CREATININE 0.84 07/02/2013   CALCIUM 9.2 07/06/2013   CALCIUM 9.7 07/05/2013   CALCIUM 9.3  07/02/2013   LFT  Recent Labs  07/05/13 1609 07/06/13 0220  PROT 7.7 7.4  ALBUMIN 3.5 3.4*  AST 26 26  ALT 12 10  ALKPHOS 201* 192*  BILITOT 2.4* 2.5*   PT/INR Lab Results  Component Value Date   INR 1.47 07/06/2013        PT                          17.4   Hepatitis Panel No results found for this basename: HEPBSAG, HCVAB, HEPAIGM, HEPBIGM,  in the last 72 hours C-Diff No components found with this basename: cdiff   Drugs of Abuse  No results found for this basename: labopia, cocainscrnur, labbenz, amphetmu, thcu, labbarb     RADIOLOGY STUDIES: Ct Chest W Contrast Ct Abdomen Pelvis W Contrast 07/05/2013    FINDINGS: CT CHEST FINDINGS  There is pacemaker in left chest wall. No axillary supraclavicular lymphadenopathy. No mediastinal hilar lymphadenopathy. There is a small pericardial effusion measuring 10 mm in depth adjacent to the right atrium. The thoracic aorta appears normal. There is no central pulmonary embolism. No mediastinal lymphadenopathy. Esophagus is normal.  Review of the lung parenchyma demonstrates no pulmonary edema or pleural fluid. Airways are normal.  CT  ABDOMEN AND PELVIS FINDINGS  There is a moderate amount of intraperitoneal free fluid along the left and right pericolic gutter and into the pelvis. Additionally there is mild free fluid within the mesentery.  Small enhancing lesion in the left hepatic lobe measures 11 mm (image 56, series 3). The liver may have a fine nodular pattern. The caudate lobe is mildly enlarged. Portal veins are grossly patent. The gallbladder, pancreas, spleen, adrenal glands, and kidneys are normal. Kidneys enhance symmetrically.  The stomach, small bowel, and cecum are normal. Appendix is normal. Several diverticula of the descending colon with acute inflammation. No bowel obstruction.  Abdominal aorta is normal caliber. There is a left retro aortic renal vein. No retroperitoneal periportal lymphadenopathy. No mesenteric adenopathy. No  organized fluid collections in the abdomen or pelvis. No intracranial free air.  No abscess within the pelvis. Free-fluid collects within the posterior cul-de-sac. Bladder prostate gland are normal. No pelvic lymphadenopathy. Insert negative then  IMPRESSION: 1. Small pericardial effusion. 2. Cardiomegaly. 3. No acute pulmonary parenchymal abnormality. 4. Moderate volume intraperitoneal free fluid along the left and right pericolic gutter, within mesenteric, and collecting within the pelvis. This likely represents fluid from congestive heart failure or potentially hepatic dysfunction. 5. No abscess in the abdomen or pelvis. 6. No CT evidence of biliary obstruction. 7. Enlarged caudate lobe and fine nodular contour. Findings concerning for cirrhosis. Additionally there is a enhancing lesion in the left hepatic lobe. When patient can tolerate MRI (preferably as an outpatient when acute symptomology has improved and patient can follow-up breath hold commands), a contrast-enhanced MRI of the abdomen would be the most appropriate exam for evaluating the liver.   Electronically Signed   By: Suzy Bouchard M.D.   On: 07/05/2013 21:25    Dg Chest Portable 1 View 07/05/2013     FINDINGS: Left-sided AICD. Enlarged cardiac silhouette. No effusion, infiltrate, or pneumothorax. No pulmonary edema. No pneumothorax.  IMPRESSION: Cardiomegaly without pulmonary edema.   Electronically Signed   By: Suzy Bouchard M.D.   On: 07/05/2013 16:33    ENDOSCOPIC STUDIES: Colonoscopy in [facility] [place].  At least 5 years ago.  Pt says this was screening study.  No pathology (no polyps , tics, AVMs)  Per pt recall   IMPRESSION:   *  N/V/D, hiccups.  Rule out GERD, rule out PUD.  Rule out gastroparesis. CG emesis witrh FOB negative stool.  Rule out portal gastropathy.  Doubt variceal bleed.   *  New diagnoses of nodular liver, likely cirrhosis.  Hx ETOH abuse as well as end stage NICM both could be causes.  Alk phos and T bili  elevated.   *  Thrombocytopenia. C/w liver disease.   *  Coagulopathy.  PT elevated, INR normal.   *  Hypokalemia.   *  CHF.  NCM.  EF 10%.  S/p AICD. On chronic Plavix.   *  Type 2 IDDM.  Insulin dependent .  A1C in 02/2013 was 11.0.  *  Diagetic retinopathy, has caused blindness.     PLAN:     *  EGD.  Timing to be determined.  Ideally we wait 5 days off Plavix to pursue endoscopic evaluation.  Given no anemia, FOB negative stool, no urgency to perform.  *  hepatitis serologies pending. C diff ordered. Trend CBC, AFP level.    Azucena Freed  07/06/2013, 10:04 AM Pager: [phone] Attending MD note:   I have taken a history, examined the patient, and reviewed the chart. I  agree with the Advanced Practitioner's impression and recommendations. Low Platelet count in the setting of normal size spleen and no  Changes of portal hypertension on CT scan. Ascites?? Cardiac corrhosis would  explain elevated alk.phosphatase. And normal transaminases. ? Secondary polycythemia vs volume contraction. Causing Hgb > 17.3. Tolerating liquids. Will observe from GI standpoint , possible EGD later in the week.  [facility] Pager # [phone]

## 2013-07-06 NOTE — Progress Notes (Signed)
[facility] [place] TEAM 1 - Stepdown/ICU TEAM  Orlean Pattenhomas Longoria [id number] DOB: February 28, 1946 DOA: 07/05/2013 PCP: Janell QuietAMPBELL, PADONDA BOYD, FNP  Brief narrative: 68 year old male patient with known diabetes and severe diabetic retinopathy-has cataracts and is legally blind also history of hypertension and severe systolic dysfunction with an EF of 10% and is status post ICD. He presented to the hospital because of weakness. 4 days prior to admission his Lasix was discontinued and he was switched to torsemide with potassium and subsequently developed anorexia and poor oral intake. Upon presentation to the ER he was found to have a fever of 102.1 and a cough. Chest x-ray was negative for infiltrate - a flu PCR was negative. According to the family the patient had coffee-ground emesis x 2. CT scan performed in the ER demonstrated cirrhotic changes to the liver which apparently is a new diagnosis for him. The family endorsed patient has a prior history of heavy alcohol use.   Assessment/Plan:    Acute gastroenteritis/Nausea vomiting and diarrhea -Followup of C. difficile, enterovirus and norovirus panel -Continue supportive care -Still with low-grade fevers -Exam no abdominal pain or clinical signs consistent with SBP    Dehydration -Home weight 170 pounds prior to onset of this illness -Heart failure clinic visit 06/29/2013 demonstrated a weight of 148 pounds and Hospital weight 139 pounds -Given severely reduced ejection fraction since patient's renal function and BP are stable and appears euvolemic at this point would defer IV fluids and allow by mouth intake only to support volume -Holding diuretics at this point -Hemoglobin is at baseline which is between 16 and 17      Coffee ground emesis -Appreciate GI evaluation -EGD eventually this admission-would like to wait until off Plavix 5 days ideally   Respiratory failure with hypoxia -Primarily appears to be related to hypoventilation  and likely can wean oxygen    Chronic systolic heart failure/EF 10% -Appreciate cardiologist assistance -Agree with holding diuretics and heart failure meds for now and gradually reintroduce as patient clinically improved -K low so hold dig and re eval in am    Cirrhosis/questionable liver mass/thrombocytopenia -GI also assisting with this problem    Diabetes mellitus -Current CBGs controlled  -Lantus prior to admission-continue low dose with SSI    HTN (hypertension) -Carvedilol and Prinivil at lower dosage than pre-admission   DVT prophylaxis: SCDs Code Status: Full Family Communication: No family at bedside Disposition Plan/Expected LOS: Stepdown  Consultants: Gastroenterology Cardiology  Procedures: None  Antibiotics: None  HPI/Subjective: Awake but confused. Denies shortness of breath or chest pain denies abdominal pain.  Objective: Blood pressure 113/69, pulse 99, temperature 99.9 F (37.7 C), temperature source Oral, resp. rate 25, height 5\' 11"  (1.803 m), weight 139 lb 15.9 oz (63.5 kg), SpO2 99.00%.  Intake/Output Summary (Last 24 hours) at 07/06/13 1411 Last data filed at 07/06/13 1300  Gross per 24 hour  Intake      0 ml  Output    625 ml  Net   -625 ml   Exam: General: No acute respiratory distress Lungs: Clear to auscultation bilaterally without wheezes or crackles, 2 L Cardiovascular: Regular rate and rhythm without murmur gallop or rub normal S1 and S2, no peripheral edema or JVD Abdomen: Nontender, nondistended, soft, bowel sounds positive, no rebound, no ascites, no appreciable mass Musculoskeletal: No significant cyanosis, clubbing of bilateral lower extremities Neurological: Awake and oriented to at least name, moves all extremities x 4 weakly without focal neurological deficits  Scheduled Meds:  Scheduled  Meds: . carvedilol  6.25 mg Oral BID WC  . digoxin  0.125 mg Oral Daily  . guaiFENesin  600 mg Oral BID  . insulin aspart  0-9 Units  Subcutaneous Q4H  . insulin glargine  5 Units Subcutaneous QHS  . lisinopril  5 mg Oral Daily  . pantoprazole (PROTONIX) IV  40 mg Intravenous Q12H  . [START ON 07/07/2013] pneumococcal 23 valent vaccine  0.5 mL Intramuscular Tomorrow-1000  . sertraline  50 mg Oral Daily  . simvastatin  5 mg Oral QHS  . sodium chloride  3 mL Intravenous Q12H  . sodium chloride  3 mL Intravenous Q12H    Data Reviewed: Basic Metabolic Panel:  Recent Labs Lab 06/29/13 1530 07/02/13 1619 07/05/13 1609 07/06/13 0220  NA 141 140 142 138  K 4.1 3.9 3.7 3.2*  CL 103 100 93* 91*  CO2 24 27 36* 32  GLUCOSE 157* 140* 230* 177*  BUN 24* 21 20 19   CREATININE 0.85 0.84 0.96 0.85  CALCIUM 9.4 9.3 9.7 9.2  MG  --   --   --  1.2*  PHOS  --   --   --  3.7   Liver Function Tests:  Recent Labs Lab 07/05/13 1609 07/06/13 0220  AST 26 26  ALT 12 10  ALKPHOS 201* 192*  BILITOT 2.4* 2.5*  PROT 7.7 7.4  ALBUMIN 3.5 3.4*   CBC:  Recent Labs Lab 07/05/13 1609 07/06/13 0220 07/06/13 0840  WBC 12.9* 9.3 9.4  NEUTROABS 11.3*  --   --   HGB 17.3* 17.3* 16.6  HCT 50.0 48.6 46.1  MCV 91.9 90.0 90.6  PLT 131* 114* 118*   Cardiac Enzymes:  Recent Labs Lab 07/06/13 0220 07/06/13 0657  TROPONINI <0.30 <0.30   BNP (last 3 results)  Recent Labs  02/13/13 2346 07/05/13 1609  PROBNP 7676.0* 14925.0*   CBG:  Recent Labs Lab 07/06/13 0101 07/06/13 0312 07/06/13 0755  GLUCAP 175* 162* 125*    Recent Results (from the past 240 hour(s))  MRSA PCR SCREENING     Status: None   Collection Time    07/06/13  1:07 AM      Result Value Range Status   MRSA by PCR NEGATIVE  NEGATIVE Final   Comment:            The GeneXpert MRSA Assay (FDA     approved for NASAL specimens     only), is one component of a     comprehensive MRSA colonization     surveillance program. It is not     intended to diagnose MRSA     infection nor to guide or     monitor treatment for     MRSA infections.      Studies:  Recent x-ray studies have been reviewed in detail by the Attending Physician  Time spent :     Junious Silk, ANP [facility] Office  [phone] Pager [phone]  **If unable to reach the above provider after paging please contact the Flow Manager @ [phone]  On-Call/Text Page:      [email or web address]      password [email or web address]  If 7PM-7AM, please contact night-coverage [email or web address] Password [email or web address] 07/06/2013, 2:11 PM   LOS: 1 day   I have personally examined this patient and reviewed the entire database. I have reviewed the above note, made any necessary editorial changes, and agree with its content.  Lonia Blood, MD [facility]

## 2013-07-07 ENCOUNTER — Inpatient Hospital Stay (HOSPITAL_COMMUNITY): Payer: Medicare Other

## 2013-07-07 ENCOUNTER — Encounter (HOSPITAL_COMMUNITY): Payer: Self-pay | Admitting: Cardiology

## 2013-07-07 ENCOUNTER — Telehealth (HOSPITAL_COMMUNITY): Payer: Self-pay | Admitting: Cardiology

## 2013-07-07 ENCOUNTER — Encounter (HOSPITAL_COMMUNITY): Payer: Medicare Other

## 2013-07-07 LAB — BASIC METABOLIC PANEL
BUN: 22 mg/dL (ref 6–23)
CHLORIDE: 91 meq/L — AB (ref 96–112)
CO2: 32 mEq/L (ref 19–32)
Calcium: 8.8 mg/dL (ref 8.4–10.5)
Creatinine, Ser: 0.84 mg/dL (ref 0.50–1.35)
GFR, EST NON AFRICAN AMERICAN: 90 mL/min — AB (ref >90)
Glucose, Bld: 169 mg/dL — ABNORMAL HIGH (ref 70–99)
Potassium: 3.6 mEq/L — ABNORMAL LOW (ref 3.7–5.3)
SODIUM: 136 meq/L — AB (ref 137–147)

## 2013-07-07 LAB — GLUCOSE, CAPILLARY
GLUCOSE-CAPILLARY: 149 mg/dL — AB (ref 70–99)
Glucose-Capillary: 198 mg/dL — ABNORMAL HIGH (ref 70–99)
Glucose-Capillary: 179 mg/dL — ABNORMAL HIGH (ref 70–99)
Glucose-Capillary: 131 mg/dL — ABNORMAL HIGH (ref 70–99)

## 2013-07-07 LAB — FECAL LACTOFERRIN, QUANT: Fecal Lactoferrin: POSITIVE

## 2013-07-07 LAB — CLOSTRIDIUM DIFFICILE BY PCR: Toxigenic C. Difficile by PCR: NEGATIVE

## 2013-07-07 LAB — AMMONIA: AMMONIA: 27 umol/L (ref 11–60)

## 2013-07-07 LAB — AFP TUMOR MARKER: AFP TUMOR MARKER: 1.8 ng/mL (ref 0.0–8.0)

## 2013-07-07 MED ORDER — PROMETHAZINE HCL 25 MG/ML IJ SOLN: 12.5000 mg | Freq: Four times a day (QID) | INTRAMUSCULAR | Status: DC | PRN

## 2013-07-07 MED ORDER — ONDANSETRON HCL 4 MG/2ML IJ SOLN: 4.0000 mg | Freq: Four times a day (QID) | INTRAMUSCULAR | Status: DC | PRN

## 2013-07-07 MED ORDER — PANTOPRAZOLE SODIUM 40 MG PO TBEC
40.0000 mg | DELAYED_RELEASE_TABLET | Freq: Every day | ORAL | Status: DC
  Administered 2013-07-07 – 2013-07-09 (×3): 40 mg via ORAL
  Filled 2013-07-07 (×2): qty 1

## 2013-07-07 MED ORDER — POTASSIUM CHLORIDE CRYS ER 20 MEQ PO TBCR
40.0000 meq | EXTENDED_RELEASE_TABLET | Freq: Two times a day (BID) | ORAL | Status: AC
  Administered 2013-07-07 (×2): 40 meq via ORAL
  Filled 2013-07-07 (×2): qty 2

## 2013-07-07 MED ORDER — DIGOXIN 125 MCG PO TABS
0.1250 mg | ORAL_TABLET | Freq: Every day | ORAL | Status: DC
  Administered 2013-07-07 – 2013-07-11 (×5): 0.125 mg via ORAL
  Filled 2013-07-07 (×5): qty 1

## 2013-07-07 MED ORDER — ONDANSETRON HCL 4 MG PO TABS: 4.0000 mg | ORAL_TABLET | ORAL | Status: DC | PRN

## 2013-07-07 NOTE — Telephone Encounter (Signed)
Normal lab letter mailed.

## 2013-07-07 NOTE — Progress Notes (Signed)
Advanced Heart Failure Rounding Note  Primary Physician: Janell Quiet, FNP  Primary Cardiologist: Arvilla Meres  Primary Electrophysiologist: None  Subjective:   Gary Gallegos is a 68 yo male referred to the HF clinic by Dr.Nelson. 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   Started having problems with his heart in early 2013. Was followed by cardiologist at [facility]. Had cath in 2013 and arteries were "fine" .  ECHO 05/05/13: EF 10%, diff HK, grade II DD, mild MR, LA mod dilated, RV mildly dilated and sys fx mod reduced, mod TR   Admitted for N/V/D, fever, myalgias and coffee ground emesis. Diuretics on hold. GI consulted and plan is for EGD possibly in the future needs to be off Plavix for 5 days. C-diff negative. Flu and noravirus pending.       Objective:   Weight Range:  Vital Signs:   Temp:  [97.8 F (36.6 C)-99.9 F (37.7 C)] 97.8 F (36.6 C) (01/20 0354) Pulse Rate:  [76-88] 88 (01/20 0354) Resp:  [23-32] 32 (01/20 0354) BP: (105-122)/(67-78) 122/78 mmHg (01/20 0738) SpO2:  [99 %-100 %] 99 % (01/20 0354)    Weight change: Filed Weights   07/06/13 0052  Weight: 139 lb 15.9 oz (63.5 kg)    Intake/Output:   Intake/Output Summary (Last 24 hours) at 07/07/13 0815 Last data filed at 07/07/13 0702  Gross per 24 hour  Intake      0 ml  Output    751 ml  Net   -751 ml     Physical Exam: General: Chronically ill appearing. No resp difficulty, sitting in WC, daughter present  HEENT: normal; arcus senilus. Lenses don't appear clouded  Neck: supple. JVP 7-8 with prominent CV wave. Carotids 2+ bilaterally; no bruits. No lymphadenopathy or thryomegaly appreciated.  Cor: PMI laterally displaced. Regular rate & rhythm. No rubs or gallops; +3/6 systolic murmur  Lungs: clear  Abdomen: soft, nontender, mildly distended. No hepatosplenomegaly. No  bruits or masses. Good bowel sounds.  Extremities: no cyanosis, clubbing, rash, edema; cool extremities  Neuro: alert & orientedx3, cranial nerves grossly intact. Moves all 4 extremities w/o difficulty. Affect pleasant.   Telemetry: SR 80s  Labs: Basic Metabolic Panel:  Recent Labs Lab 07/02/13 1619 07/05/13 1609 07/06/13 0220 07/07/13 0320  NA 140 142 138 136*  K 3.9 3.7 3.2* 3.6*  CL 100 93* 91* 91*  CO2 27 36* 32 32  GLUCOSE 140* 230* 177* 169*  BUN 21 20 19 22   CREATININE 0.84 0.96 0.85 0.84  CALCIUM 9.3 9.7 9.2 8.8  MG  --   --  1.2*  --   PHOS  --   --  3.7  --     Liver Function Tests:  Recent Labs Lab 07/05/13 1609 07/06/13 0220  AST 26 26  ALT 12 10  ALKPHOS 201* 192*  BILITOT 2.4* 2.5*  PROT 7.7 7.4  ALBUMIN 3.5 3.4*   No results found for this basename: LIPASE, AMYLASE,  in the last 168 hours  Recent Labs Lab 07/07/13 0320  AMMONIA 27    CBC:  Recent Labs Lab 07/05/13 1609 07/06/13 0220 07/06/13 0840 07/06/13 2055  WBC 12.9* 9.3 9.4 7.6  NEUTROABS 11.3*  --   --   --   HGB 17.3* 17.3* 16.6 16.3  HCT 50.0 48.6 46.1 46.1  MCV 91.9 90.0 90.6 90.7  PLT 131*  114* 118* 119*    Cardiac Enzymes:  Recent Labs Lab 07/06/13 0220 07/06/13 0657 07/06/13 1334  TROPONINI <0.30 <0.30 <0.30    BNP: BNP (last 3 results)  Recent Labs  02/13/13 2346 07/05/13 1609  PROBNP 7676.0* 14925.0*     Other results:  EKG:   Imaging: Ct Chest W Contrast  07/05/2013   CLINICAL DATA:  Nausea, vomiting, diarrhea, congestive heart failure, fever.  EXAM: CT CHEST, ABDOMEN, AND PELVIS WITH CONTRAST  TECHNIQUE: Multidetector CT imaging of the chest, abdomen and pelvis was performed following the standard protocol during bolus administration of intravenous contrast.  CONTRAST:  100mL OMNIPAQUE IOHEXOL 300 MG/ML  SOLN  COMPARISON:  None.  FINDINGS: CT CHEST FINDINGS  There is pacemaker in left chest wall. No axillary supraclavicular lymphadenopathy. No  mediastinal hilar lymphadenopathy. There is a small pericardial effusion measuring 10 mm in depth adjacent to the right atrium. The thoracic aorta appears normal. There is no central pulmonary embolism. No mediastinal lymphadenopathy. Esophagus is normal.  Review of the lung parenchyma demonstrates no pulmonary edema or pleural fluid. Airways are normal.  CT ABDOMEN AND PELVIS FINDINGS  There is a moderate amount of intraperitoneal free fluid along the left and right pericolic gutter and into the pelvis. Additionally there is mild free fluid within the mesentery.  Small enhancing lesion in the left hepatic lobe measures 11 mm (image 56, series 3). The liver may have a fine nodular pattern. The caudate lobe is mildly enlarged. Portal veins are grossly patent. The gallbladder, pancreas, spleen, adrenal glands, and kidneys are normal. Kidneys enhance symmetrically.  The stomach, small bowel, and cecum are normal. Appendix is normal. Several diverticula of the descending colon with acute inflammation. No bowel obstruction.  Abdominal aorta is normal caliber. There is a left retro aortic renal vein. No retroperitoneal periportal lymphadenopathy. No mesenteric adenopathy. No organized fluid collections in the abdomen or pelvis. No intracranial free air.  No abscess within the pelvis. Free-fluid collects within the posterior cul-de-sac. Bladder prostate gland are normal. No pelvic lymphadenopathy. Insert negative then  IMPRESSION: 1. Small pericardial effusion. 2. Cardiomegaly. 3. No acute pulmonary parenchymal abnormality. 4. Moderate volume intraperitoneal free fluid along the left and right pericolic gutter, within mesenteric, and collecting within the pelvis. This likely represents fluid from congestive heart failure or potentially hepatic dysfunction. 5. No abscess in the abdomen or pelvis. 6. No CT evidence of biliary obstruction. 7. Enlarged caudate lobe and fine nodular contour. Findings concerning for cirrhosis.  Additionally there is a enhancing lesion in the left hepatic lobe. When patient can tolerate MRI (preferably as an outpatient when acute symptomology has improved and patient can follow-up breath hold commands), a contrast-enhanced MRI of the abdomen would be the most appropriate exam for evaluating the liver.   Electronically Signed   By: Genevive BiStewart  Edmunds M.D.   On: 07/05/2013 21:25   Ct Abdomen Pelvis W Contrast  07/05/2013   CLINICAL DATA:  Nausea, vomiting, diarrhea, congestive heart failure, fever.  EXAM: CT CHEST, ABDOMEN, AND PELVIS WITH CONTRAST  TECHNIQUE: Multidetector CT imaging of the chest, abdomen and pelvis was performed following the standard protocol during bolus administration of intravenous contrast.  CONTRAST:  100mL OMNIPAQUE IOHEXOL 300 MG/ML  SOLN  COMPARISON:  None.  FINDINGS: CT CHEST FINDINGS  There is pacemaker in left chest wall. No axillary supraclavicular lymphadenopathy. No mediastinal hilar lymphadenopathy. There is a small pericardial effusion measuring 10 mm in depth adjacent to the right atrium. The thoracic aorta appears  normal. There is no central pulmonary embolism. No mediastinal lymphadenopathy. Esophagus is normal.  Review of the lung parenchyma demonstrates no pulmonary edema or pleural fluid. Airways are normal.  CT ABDOMEN AND PELVIS FINDINGS  There is a moderate amount of intraperitoneal free fluid along the left and right pericolic gutter and into the pelvis. Additionally there is mild free fluid within the mesentery.  Small enhancing lesion in the left hepatic lobe measures 11 mm (image 56, series 3). The liver may have a fine nodular pattern. The caudate lobe is mildly enlarged. Portal veins are grossly patent. The gallbladder, pancreas, spleen, adrenal glands, and kidneys are normal. Kidneys enhance symmetrically.  The stomach, small bowel, and cecum are normal. Appendix is normal. Several diverticula of the descending colon with acute inflammation. No bowel  obstruction.  Abdominal aorta is normal caliber. There is a left retro aortic renal vein. No retroperitoneal periportal lymphadenopathy. No mesenteric adenopathy. No organized fluid collections in the abdomen or pelvis. No intracranial free air.  No abscess within the pelvis. Free-fluid collects within the posterior cul-de-sac. Bladder prostate gland are normal. No pelvic lymphadenopathy. Insert negative then  IMPRESSION: 1. Small pericardial effusion. 2. Cardiomegaly. 3. No acute pulmonary parenchymal abnormality. 4. Moderate volume intraperitoneal free fluid along the left and right pericolic gutter, within mesenteric, and collecting within the pelvis. This likely represents fluid from congestive heart failure or potentially hepatic dysfunction. 5. No abscess in the abdomen or pelvis. 6. No CT evidence of biliary obstruction. 7. Enlarged caudate lobe and fine nodular contour. Findings concerning for cirrhosis. Additionally there is a enhancing lesion in the left hepatic lobe. When patient can tolerate MRI (preferably as an outpatient when acute symptomology has improved and patient can follow-up breath hold commands), a contrast-enhanced MRI of the abdomen would be the most appropriate exam for evaluating the liver.   Electronically Signed   By: Genevive Bi M.D.   On: 07/05/2013 21:25   Dg Chest Portable 1 View  07/05/2013   CLINICAL DATA:  CHF, weakness  EXAM: PORTABLE CHEST - 1 VIEW  COMPARISON:  DG CHEST 2 VIEW dated 02/16/2013  FINDINGS: Left-sided AICD. Enlarged cardiac silhouette. No effusion, infiltrate, or pneumothorax. No pulmonary edema. No pneumothorax.  IMPRESSION: Cardiomegaly without pulmonary edema.   Electronically Signed   By: Genevive Bi M.D.   On: 07/05/2013 16:33      Medications:     Scheduled Medications: . carvedilol  6.25 mg Oral BID WC  . guaiFENesin  600 mg Oral BID  . insulin aspart  0-9 Units Subcutaneous TID WC  . insulin glargine  5 Units Subcutaneous QHS  .  lisinopril  5 mg Oral Daily  . pantoprazole (PROTONIX) IV  40 mg Intravenous Q12H  . pneumococcal 23 valent vaccine  0.5 mL Intramuscular Tomorrow-1000  . sertraline  50 mg Oral Daily  . simvastatin  5 mg Oral QHS     Infusions:     PRN Medications:  acetaminophen, acetaminophen, guaiFENesin-dextromethorphan, HYDROcodone-acetaminophen, ondansetron (ZOFRAN) IV, ondansetron, zolpidem   Assessment:   1) N/V/D 2) Severe biventricular HF, EF 10% 3) NICM 4) Diabetic retinopathy  Plan/Discussion:    Stable overnight. No longer having complaints of N/V/D. Will place for daily weights and strict I/O. SBP stable. Will restart digoxin. Will hold off on diuretics another day. He eventually will need RHC to assess hemodynamics and possible inotrope therapy.   Length of Stay: 2  Aundria Rud NP-C 07/07/2013, 8:15 AM  Advanced Heart Failure Team Pager  [phone] (M-F; 7a - 4p)  Please contact [facility] for night-coverage after hours (4p -7a ) and weekends on [email or web address]  Patient seen and examined with Ulla Potash, NP. We discussed all aspects of the encounter. I agree with the assessment and plan as stated above.   Improving slowly. HF meds restarted. Agree with holding diuretics another day. Will need RHC prior to D/c.   Daniel Bensimhon,MD 5:09 PM

## 2013-07-07 NOTE — Progress Notes (Addendum)
Daily Rounding Note  07/07/2013, 8:51 AM  LOS: 2 days   SUBJECTIVE:       Formed BMs, not bloody or dark, overnight.  No nausea.  Asking for food, he is hungry.  No abdominal pain.  No SOB  OBJECTIVE:         Vital signs in last 24 hours:    Temp:  [97.8 F (36.6 C)-99.9 F (37.7 C)] 98.5 F (36.9 C) (01/20 0730) Pulse Rate:  [76-88] 82 (01/20 0730) Resp:  [22-32] 22 (01/20 0730) BP: (105-122)/(67-78) 122/78 mmHg (01/20 0738) SpO2:  [99 %-100 %] 100 % (01/20 0730)   General: looks chronically unwell and frail.    Heart: RRR.  No MRG Chest: clear bil  No labored breathing.  Hiccups improved Abdomen: soft, NT, ND.  Active BS  Extremities: no CCE Neuro/Psych:  Pleasant, cooperative, sleepy but easily aroused.  Oriented to place, not to year but not obviously confused. He is appropriate.   Intake/Output from previous day: 01/19 0701 - 01/20 0700 In: -  Out: 750 [Urine:750]  Intake/Output this shift: Total I/O In: -  Out: 1 [Stool:1]  Lab Results:  Recent Labs  07/06/13 0220 07/06/13 0840 07/06/13 2055  WBC 9.3 9.4 7.6  HGB 17.3* 16.6 16.3  HCT 48.6 46.1 46.1  PLT 114* 118* 119*   BMET  Recent Labs  07/05/13 1609 07/06/13 0220 07/07/13 0320  NA 142 138 136*  K 3.7 3.2* 3.6*  CL 93* 91* 91*  CO2 36* 32 32  GLUCOSE 230* 177* 169*  BUN _0 CREATININE 0.96 0.85 0.84  CALCIUM 9.7 9.2 8.8   LFT  Recent Labs  07/05/13 1609 07/06/13 0220  PROT 7.7 7.4  ALBUMIN 3.5 3.4*  AST 26 26  ALT 12 10  ALKPHOS 201* 192*  BILITOT 2.4* 2.5*   PT/INR  Recent Labs  07/06/13 0220  LABPROT 17.4*  INR 1.47   Hepatitis Panel  Recent Labs  07/06/13 0220  HEPBSAG NEGATIVE  HCVAB NEGATIVE  HEPAIGM NON REACTIVE  HEPBIGM NON REACTIVE    Studies/Results: Ct Chest W Contrast Ct Abdomen Pelvis W Contrast 07/05/2013   FINDINGS: CT CHEST FINDINGS  There is pacemaker in left chest wall. No  axillary supraclavicular lymphadenopathy. No mediastinal hilar lymphadenopathy. There is a small pericardial effusion measuring 10 mm in depth adjacent to the right atrium. The thoracic aorta appears normal. There is no central pulmonary embolism. No mediastinal lymphadenopathy. Esophagus is normal.  Review of the lung parenchyma demonstrates no pulmonary edema or pleural fluid. Airways are normal.  CT ABDOMEN AND PELVIS FINDINGS  There is a moderate amount of intraperitoneal free fluid along the left and right pericolic gutter and into the pelvis. Additionally there is mild free fluid within the mesentery.  Small enhancing lesion in the left hepatic lobe measures 11 mm (image 56, series 3). The liver may have a fine nodular pattern. The caudate lobe is mildly enlarged. Portal veins are grossly patent. The gallbladder, pancreas, spleen, adrenal glands, and kidneys are normal. Kidneys enhance symmetrically.  The stomach, small bowel, and cecum are normal. Appendix is normal. Several diverticula of the descending colon with acute inflammation. No bowel obstruction.  Abdominal aorta is normal caliber. There is a left retro aortic renal vein. No retroperitoneal periportal lymphadenopathy. No mesenteric adenopathy. No organized fluid collections in the abdomen or pelvis. No intracranial free air.  No abscess within the pelvis. Free-fluid collects within the  posterior cul-de-sac. Bladder prostate gland are normal. No pelvic lymphadenopathy. Insert negative then  IMPRESSION: 1. Small pericardial effusion. 2. Cardiomegaly. 3. No acute pulmonary parenchymal abnormality. 4. Moderate volume intraperitoneal free fluid along the left and right pericolic gutter, within mesenteric, and collecting within the pelvis. This likely represents fluid from congestive heart failure or potentially hepatic dysfunction. 5. No abscess in the abdomen or pelvis. 6. No CT evidence of biliary obstruction. 7. Enlarged caudate lobe and fine nodular  contour. Findings concerning for cirrhosis. Additionally there is a enhancing lesion in the left hepatic lobe. When patient can tolerate MRI (preferably as an outpatient when acute symptomology has improved and patient can follow-up breath hold commands), a contrast-enhanced MRI of the abdomen would be the most appropriate exam for evaluating the liver.   Electronically Signed   By: Suzy Bouchard M.D.   On: 07/05/2013 21:25   Dg Chest Portable 1 View 07/05/2013   CLINICAL DATA:  CHF, weakness  EXAM: PORTABLE CHEST - 1 VIEW  COMPARISON:  DG CHEST 2 VIEW dated 02/16/2013  FINDINGS: Left-sided AICD. Enlarged cardiac silhouette. No effusion, infiltrate, or pneumothorax. No pulmonary edema. No pneumothorax.  IMPRESSION: Cardiomegaly without pulmonary edema.   Electronically Signed   By: Suzy Bouchard M.D.   On: 07/05/2013 16:33    ASSESMENT:   * N/V/D, hiccups. C diff is negative. Rule out GERD, rule out PUD. Rule out gastroparesis.  CG emesis witrh FOB negative stool. Rule out portal gastropathy. Doubt variceal bleed.  * New diagnoses of nodular liver, likely cirrhosis. Hx ETOH abuse as well as end stage NICM both could be causes.  Alk phos and T bili elevated.  Left hepatic lobe lesion, AFP level 1.8, so unlikely this represents HCCA.  * Thrombocytopenia. C/w liver disease.  * Coagulopathy. PT elevated, INR normal.  * Hypokalemia.  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 On chronic Plavix. Last dose estimated to have been on 1/18  * Type 2 IDDM. Insulin dependent . A1C in 02/2013 was 11.0.  * Diagetic retinopathy, has caused blindness.     PLAN   *  EGD 11/23 this will be 5th day off Plavix. Pt aware of plans and agreeable.  *  With all of pt's co-morbidities, a Palliative care goals of care consult would be a good idea.  His overall medium term prognosis is not good and would expect multiple future hospitalizations.   Gary Gallegos  07/07/2013, 8:51 AM Pager: [phone] Attending MD note:    I have taken a history, examined the patient, and reviewed the chart. I agree with the Advanced Practitioner's impression and recommendations. For EGD on 05/10/2014  [facility] Pager # [phone]

## 2013-07-07 NOTE — Telephone Encounter (Signed)
Message copied by Sung Renton, Milagros Reap on Tue Jul 07, 2013  8:35 AM ------      Message from: Dolores Patty      Created: Fri Jul 03, 2013  4:14 PM       ok ------

## 2013-07-07 NOTE — Progress Notes (Signed)
[facility] [place] TEAM 1 - Stepdown/ICU TEAM  Traveon Louro ZOX:[id number] DOB: [birth year] DOA: 07/05/2013 PCP: Janell Quiet, FNP  Brief narrative: 68 year old male patient with known diabetes and severe diabetic retinopathy-has cataracts and is legally blind also history of hypertension and severe systolic dysfunction with an EF of 10% and is status post ICD. He presented to the hospital because of weakness. 4 days prior to admission his Lasix was discontinued and he was switched to torsemide with potassium and subsequently developed anorexia and poor oral intake. Upon presentation to the ER he was found to have a fever of 102.1 and a cough. Chest x-ray was negative for infiltrate - a flu PCR was negative. According to the family the patient had coffee-ground emesis x 2. CT scan performed in the ER demonstrated cirrhotic changes to the liver which apparently is a new diagnosis for him. The family endorsed patient has a prior history of heavy alcohol use.   Assessment/Plan:    Acute gastroenteritis/Nausea vomiting and diarrhea -C. Difficile neg- enterovirus and norovirus panel pending- fecal lactoferrin positive which is c/w acute infectious process -Continue supportive care -low-grade fevers have resolved -no abdominal pain or clinical signs consistent with SBP    Dehydration -Home weight 170 pounds prior to onset of this illness -Heart failure clinic visit 06/29/2013 demonstrated a weight of 148 pounds and Hospital weight 139 pounds -Given severely reduced ejection fraction since patient's renal function and BP are stable and appears euvolemic at this point would defer IV fluids and allow by mouth intake only to support volume -Holding diuretics at this point -Hemoglobin is at baseline which is between 16 and 17      Coffee ground emesis -Appreciate GI evaluation -EGD eventually this admission-would like to wait until off Plavix 5 days ideally- tentative plan for  1/23 -GI advanced to solid diet  ** 307 pm: Developed N/V- transition diet back to clears- antiemetics adjusted-    Respiratory failure with hypoxia -Primarily appears to be related to hypoventilation and likely can wean oxygen    Chronic systolic heart failure/EF 10% -Appreciate cardiologist assistance -Agree with holding diuretics and heart failure meds for now and gradually reintroduce as patient clinically improved -K low 1/19 so held dig - still low so will follow -was new pt to OP CHF Clinic- only one visit pre admit- rec RHF at some point to assess hemodynamics and possible inotrope rxn    Cirrhosis/questionable liver mass/thrombocytopenia -GI also assisting with this problem    Diabetes mellitus/poorly controlled -Current CBGs controlled but HgbA1c high at 10.9 -Lantus prior to admission- resume at lower dose -continue low dose with SSI    HTN (hypertension) -Carvedilol and Prinivil at lower dosage than pre-admission   DVT prophylaxis: SCDs Code Status: Full Family Communication: No family at bedside Disposition Plan/Expected LOS: Transfer to floor  Consultants: Gastroenterology Cardiology  Procedures: None  Antibiotics: None  HPI/Subjective: Awake but confused. Denies shortness of breath or chest pain denies abdominal pain.  Objective: Blood pressure 120/95, pulse 91, temperature 98.7 F (37.1 C), temperature source Oral, resp. rate 17, height 5\' 5"  (1.651 m), weight 187 lb 2.7 oz (84.9 kg), SpO2 97.00%.  Intake/Output Summary (Last 24 hours) at 07/07/13 1411 Last data filed at 07/07/13 0702  Gross per 24 hour  Intake      0 ml  Output    551 ml  Net   -551 ml   Exam: General: No acute respiratory distress Lungs: Clear to auscultation bilaterally without wheezes  or crackles, 2 L Cardiovascular: Regular rate and rhythm without murmur gallop or rub normal S1 and S2, no peripheral edema or JVD Abdomen: Nontender, nondistended, soft, bowel sounds  positive, no rebound, no ascites, no appreciable mass Musculoskeletal: No significant cyanosis, clubbing of bilateral lower extremities Neurological: Awake and oriented to at least name, moves all extremities x 4 weakly without focal neurological deficits  Scheduled Meds:  Scheduled Meds: . carvedilol  6.25 mg Oral BID WC  . digoxin  0.125 mg Oral Daily  . guaiFENesin  600 mg Oral BID  . insulin aspart  0-9 Units Subcutaneous TID WC  . insulin glargine  5 Units Subcutaneous QHS  . lisinopril  5 mg Oral Daily  . pantoprazole  40 mg Oral Q0600  . potassium chloride  40 mEq Oral BID  . sertraline  50 mg Oral Daily  . simvastatin  5 mg Oral QHS    Data Reviewed: Basic Metabolic Panel:  Recent Labs Lab 07/02/13 1619 07/05/13 1609 07/06/13 0220 07/07/13 0320  NA 140 142 138 136*  K 3.9 3.7 3.2* 3.6*  CL 100 93* 91* 91*  CO2 27 36* 32 32  GLUCOSE 140* 230* 177* 169*  BUN 21 20 19 22   CREATININE 0.84 0.96 0.85 0.84  CALCIUM 9.3 9.7 9.2 8.8  MG  --   --  1.2*  --   PHOS  --   --  3.7  --    Liver Function Tests:  Recent Labs Lab 07/05/13 1609 07/06/13 0220  AST 26 26  ALT 12 10  ALKPHOS 201* 192*  BILITOT 2.4* 2.5*  PROT 7.7 7.4  ALBUMIN 3.5 3.4*   CBC:  Recent Labs Lab 07/05/13 1609 07/06/13 0220 07/06/13 0840 07/06/13 2055  WBC 12.9* 9.3 9.4 7.6  NEUTROABS 11.3*  --   --   --   HGB 17.3* 17.3* 16.6 16.3  HCT 50.0 48.6 46.1 46.1  MCV 91.9 90.0 90.6 90.7  PLT 131* 114* 118* 119*   Cardiac Enzymes:  Recent Labs Lab 07/06/13 0220 07/06/13 0657 07/06/13 1334  TROPONINI <0.30 <0.30 <0.30   BNP (last 3 results)  Recent Labs  02/13/13 2346 07/05/13 1609  PROBNP 7676.0* 14925.0*   CBG:  Recent Labs Lab 07/06/13 1048 07/06/13 1624 07/06/13 2200 07/07/13 0737 07/07/13 1223  GLUCAP 116* 109* 184* 198* 179*    Recent Results (from the past 240 hour(s))  MRSA PCR SCREENING     Status: None   Collection Time    07/06/13  1:07 AM      Result  Value Range Status   MRSA by PCR NEGATIVE  NEGATIVE Final   Comment:            The GeneXpert MRSA Assay (FDA     approved for NASAL specimens     only), is one component of a     comprehensive MRSA colonization     surveillance program. It is not     intended to diagnose MRSA     infection nor to guide or     monitor treatment for     MRSA infections.  STOOL CULTURE     Status: None   Collection Time    07/06/13 10:00 PM      Result Value Range Status   Specimen Description STOOL   Final   Special Requests Normal   Final   Culture     Final   Value: Culture reincubated for better growth     Performed  at [facility]   Report Status PENDING   Incomplete  CLOSTRIDIUM DIFFICILE BY PCR     Status: None   Collection Time    07/06/13 10:00 PM      Result Value Range Status   C difficile by pcr NEGATIVE  NEGATIVE Final     Studies:  Recent x-ray studies have been reviewed in detail by the Attending Physician  Time spent : 20 minutes     Junious SilkAllison Ellis, ANP [facility] Office  [phone] Pager [phone]  **If unable to reach the above provider after paging please contact the Flow Manager @ [phone]  On-Call/Text Page:      [email or web address] [email or web address]      password [email or web address]  If 7PM-7AM, please contact night-coverage [email or web address] Password [email or web address] 07/07/2013, 2:11 PM   LOS: 2 days   I have examined the patient, reviewed the chart and modified the above note which I agree with.   Tenille Morrill,MD [phone] 07/07/2013, 5:10 PM

## 2013-07-07 NOTE — Progress Notes (Signed)
Chaplain responded to spiritual consult. Pt was present with his daughter Gary Gallegos. Faith stated that she was anxious to have more information about her father's condition. Faith asked that chaplain say prayer for health and recovery for the patient. Chaplain provided emotional and spiritual support.

## 2013-07-07 NOTE — Progress Notes (Signed)
Inpatient Diabetes Program Recommendations  AACE/ADA: New Consensus Statement on Inpatient Glycemic Control (2013)  Target Ranges:  Prepandial:   less than 140 mg/dL      Peak postprandial:   less than 180 mg/dL (1-2 hours)      Critically ill patients:  140 - 180 mg/dL   Reason for Visit: Results for MARZELL, LEDDON (MRN [id number]) as of 07/07/2013 15:30  Ref. Range 07/06/2013 22:00 07/07/2013 07:37 07/07/2013 12:23  Glucose-Capillary Latest Range: 70-99 mg/dL 888 (H) 280 (H) 034 (H)   Consider increasing Lantus to 10 units daily.  Thanks, Beryl Meager, RN, BC-ADM Inpatient Diabetes Coordinator Pager [phone]

## 2013-07-08 ENCOUNTER — Encounter (HOSPITAL_COMMUNITY)
Admission: EM | Disposition: A | Discharge status: Home Health | Payer: Self-pay | Source: Home / Self Care | Attending: Internal Medicine

## 2013-07-08 DIAGNOSIS — R197 Diarrhea, unspecified: Secondary | ICD-10-CM

## 2013-07-08 DIAGNOSIS — R112 Nausea with vomiting, unspecified: Secondary | ICD-10-CM

## 2013-07-08 DIAGNOSIS — I509 Heart failure, unspecified: Secondary | ICD-10-CM

## 2013-07-08 HISTORY — PX: RIGHT HEART CATHETERIZATION: SHX5447

## 2013-07-08 LAB — POCT I-STAT 3, ART BLOOD GAS (G3+)
ACID-BASE EXCESS: 2 mmol/L (ref 0.0–2.0)
Bicarbonate: 25.4 mEq/L — ABNORMAL HIGH (ref 20.0–24.0)
O2 Saturation: 97 %
PO2 ART: 87 mmHg (ref 80.0–100.0)
TCO2: 26 mmol/L (ref 0–100)
pCO2 arterial: 35.6 mmHg (ref 35.0–45.0)
pH, Arterial: 7.461 — ABNORMAL HIGH (ref 7.350–7.450)

## 2013-07-08 LAB — CBC
HCT: 51.3 % (ref 39.0–52.0)
Hemoglobin: 18.1 g/dL — ABNORMAL HIGH (ref 13.0–17.0)
MCH: 32.2 pg (ref 26.0–34.0)
MCHC: 35.3 g/dL (ref 30.0–36.0)
MCV: 91.3 fL (ref 78.0–100.0)
PLATELETS: 129 10*3/uL — AB (ref 150–400)
RBC: 5.62 MIL/uL (ref 4.22–5.81)
RDW: 14.5 % (ref 11.5–15.5)
WBC: 10.9 10*3/uL — ABNORMAL HIGH (ref 4.0–10.5)

## 2013-07-08 LAB — GLUCOSE, CAPILLARY
Glucose-Capillary: 115 mg/dL — ABNORMAL HIGH (ref 70–99)
Glucose-Capillary: 212 mg/dL — ABNORMAL HIGH (ref 70–99)
Glucose-Capillary: 160 mg/dL — ABNORMAL HIGH (ref 70–99)
Glucose-Capillary: 303 mg/dL — ABNORMAL HIGH (ref 70–99)

## 2013-07-08 LAB — ENTEROVIRUS PCR: ENTEROVIRUS PCR: NOT DETECTED

## 2013-07-08 LAB — POCT I-STAT 3, VENOUS BLOOD GAS (G3P V)
ACID-BASE EXCESS: 7 mmol/L — AB (ref 0.0–2.0)
Bicarbonate: 31.2 mEq/L — ABNORMAL HIGH (ref 20.0–24.0)
O2 Saturation: 68 %
PCO2 VEN: 41.6 mmHg — AB (ref 45.0–50.0)
TCO2: 32 mmol/L (ref 0–100)
pH, Ven: 7.483 — ABNORMAL HIGH (ref 7.250–7.300)
pO2, Ven: 33 mmHg (ref 30.0–45.0)

## 2013-07-08 LAB — BASIC METABOLIC PANEL
BUN: 29 mg/dL — ABNORMAL HIGH (ref 6–23)
CALCIUM: 8.8 mg/dL (ref 8.4–10.5)
CO2: 27 meq/L (ref 19–32)
CREATININE: 0.72 mg/dL (ref 0.50–1.35)
Chloride: 94 mEq/L — ABNORMAL LOW (ref 96–112)
GFR calc Af Amer: >90 mL/min (ref >90)
GFR calc non Af Amer: >90 mL/min (ref >90)
Glucose, Bld: 105 mg/dL — ABNORMAL HIGH (ref 70–99)
Potassium: 4.2 mEq/L (ref 3.7–5.3)
Sodium: 137 mEq/L (ref 137–147)

## 2013-07-08 SURGERY — RIGHT HEART CATH
Anesthesia: LOCAL

## 2013-07-08 MED ORDER — ONDANSETRON HCL 4 MG/2ML IJ SOLN: 4.0000 mg | Freq: Four times a day (QID) | INTRAMUSCULAR | Status: DC | PRN

## 2013-07-08 MED ORDER — FENTANYL CITRATE 0.05 MG/ML IJ SOLN
INTRAMUSCULAR | Status: AC
  Filled 2013-07-08: qty 2

## 2013-07-08 MED ORDER — SODIUM CHLORIDE 0.9 % IV SOLN
250.0000 mL | INTRAVENOUS | Status: DC | PRN
  Administered 2013-07-09: 500 mL via INTRAVENOUS

## 2013-07-08 MED ORDER — TORSEMIDE 20 MG PO TABS
60.0000 mg | ORAL_TABLET | Freq: Every day | ORAL | Status: DC
  Administered 2013-07-09 – 2013-07-11 (×3): 60 mg via ORAL
  Filled 2013-07-08 (×3): qty 3

## 2013-07-08 MED ORDER — HEPARIN SODIUM (PORCINE) 5000 UNIT/ML IJ SOLN
5000.0000 [IU] | Freq: Three times a day (TID) | INTRAMUSCULAR | Status: DC
  Administered 2013-07-08 – 2013-07-11 (×10): 5000 [IU] via SUBCUTANEOUS
  Filled 2013-07-08 (×12): qty 1

## 2013-07-08 MED ORDER — LISINOPRIL 10 MG PO TABS
10.0000 mg | ORAL_TABLET | Freq: Every day | ORAL | Status: DC
  Administered 2013-07-08 – 2013-07-11 (×4): 10 mg via ORAL
  Filled 2013-07-08 (×4): qty 1

## 2013-07-08 MED ORDER — MILRINONE IN DEXTROSE 20 MG/100ML IV SOLN
0.3750 ug/kg/min | INTRAVENOUS | Status: DC
  Administered 2013-07-08 – 2013-07-09 (×2): 0.25 ug/kg/min via INTRAVENOUS
  Administered 2013-07-10 – 2013-07-11 (×2): 0.375 ug/kg/min via INTRAVENOUS
  Filled 2013-07-08 (×4): qty 100

## 2013-07-08 MED ORDER — SODIUM CHLORIDE 0.9 % IJ SOLN: 3.0000 mL | Freq: Two times a day (BID) | INTRAMUSCULAR | Status: DC

## 2013-07-08 MED ORDER — ACETAMINOPHEN 325 MG PO TABS: 650.0000 mg | ORAL_TABLET | ORAL | Status: DC | PRN

## 2013-07-08 MED ORDER — SIMETHICONE 80 MG PO CHEW
160.0000 mg | CHEWABLE_TABLET | Freq: Four times a day (QID) | ORAL | Status: DC | PRN
  Administered 2013-07-08: 160 mg via ORAL
  Filled 2013-07-08: qty 2

## 2013-07-08 MED ORDER — SODIUM CHLORIDE 0.9 % IJ SOLN: 3.0000 mL | INTRAMUSCULAR | Status: DC | PRN

## 2013-07-08 MED ORDER — SIMETHICONE 80 MG PO CHEW
160.0000 mg | CHEWABLE_TABLET | Freq: Once | ORAL | Status: AC
  Administered 2013-07-08: 160 mg via ORAL
  Filled 2013-07-08: qty 2

## 2013-07-08 MED ORDER — SPIRONOLACTONE 12.5 MG HALF TABLET
12.5000 mg | ORAL_TABLET | Freq: Every day | ORAL | Status: DC
  Administered 2013-07-08 – 2013-07-11 (×4): 12.5 mg via ORAL
  Filled 2013-07-08 (×4): qty 1

## 2013-07-08 MED ORDER — MAGNESIUM SULFATE 40 MG/ML IJ SOLN
4.0000 g | Freq: Once | INTRAMUSCULAR | Status: AC
  Administered 2013-07-08: 4 g via INTRAVENOUS
  Filled 2013-07-08 (×2): qty 100

## 2013-07-08 NOTE — Progress Notes (Signed)
Pt transferred to 3E26 from step down unit, VSS, pt denies any pain or discomfort at this time. Pt on fall precaution, since he is legally blind, bed alarm in place for safety, pt very unsteady on his feet. We'll continue with POC.

## 2013-07-08 NOTE — Progress Notes (Signed)
Thank you for consulting the Palliative Medicine Team at [facility] to meet your patient's and family's needs.   The reason that you asked [place] to see your patient is for clarification of GOC and options.  We have scheduled your patient for a meeting: 1/22 1300  The Surrogate decision make is: [name] - daughter Contact information: [phone]  Other family members that need to be present: brother via telephone    Your patient is able/unable to participate: yes  Yong Channel, NP Palliative Medicine Team Team Phone # [phone]

## 2013-07-08 NOTE — Progress Notes (Signed)
Stool cultures and C Diff negative. D/C enteric precautions. Pt awaiting heart Cath. Daughter at bedside .

## 2013-07-08 NOTE — Plan of Care (Signed)
Problem: Phase I Progression Outcomes Goal: OOB as tolerated unless otherwise ordered Outcome: Progressing Pt is very unsteady on his feet, plus is legally blind, needs 1-2 person assistance.

## 2013-07-08 NOTE — Progress Notes (Addendum)
Advanced Heart Failure Rounding Note  Primary Physician: Janell QuietAMPBELL, PADONDA BOYD, FNP  Primary Cardiologist: Arvilla Meresaniel Bensimhon  Primary Electrophysiologist: None  Subjective:   [name] is a 68 yo male referred to the HF clinic by Dr.Nelson. He has a history of DM2 with severe diabetic retinopathy, cataracts HTN, HLD, chronic systolic HF due to severe, EF 16%10% (11/14) s/p ICD [employer].   Started having problems with his heart in early 2013. [facility]. Had cath in 2013 and arteries were "fine"  ECHO 05/05/13: EF 10%, diff HK, grade II DD, mild MR, LA mod dilated, RV mildly dilated and sys fx mod reduced, mod TR   Admitted for N/V/D, fever, myalgias and coffee ground emesis. Diuretics on hold. GI consulted and plan is for EGD 1/23 after off Plavix for 5 days. C-diff negative. Flu and noravirus pending. Transferred to telemetry yesterday. Was advanced to solid diet yesterday and developed nausea again and now back on clears. + "gas pains" Denies SOB, orthopnea or CP. Weight unchanged.   Objective:   Weight Range:  Vital Signs:   Temp:  [98 F (36.7 C)-98.8 F (37.1 C)] 98.2 F (36.8 C) (01/21 0643) Pulse Rate:  [73-91] 73 (01/21 0643) Resp:  [15-20] 18 (01/21 0643) BP: (97-124)/(71-79) 124/76 mmHg (01/21 0643) SpO2:  [97 %-100 %] 98 % (01/21 0643) Weight:  [136 lb 11.2 oz (62.007 kg)-136 lb 12.8 oz (62.052 kg)] 136 lb 11.2 oz (62.007 kg) (01/21 0643)    Weight change: Filed Weights   07/07/13 0821 07/07/13 2032 07/08/13 0643  Weight: 143 lb 8.3 oz (65.1 kg) 136 lb 12.8 oz (62.052 kg) 136 lb 11.2 oz (62.007 kg)    Intake/Output:   Intake/Output Summary (Last 24 hours) at 07/08/13 0840 Last data filed at 07/08/13 0608  Gross per 24 hour  Intake    338 ml  Output      0 ml  Net    338 ml     Physical Exam: General: Chronically ill appearing. No resp difficulty, lying in bed HEENT: normal; arcus senilus. Lenses don't appear clouded   Neck: supple. JVP 7 with prominent CV wave. Carotids 2+ bilaterally; no bruits. No lymphadenopathy or thryomegaly appreciated.  Cor: PMI laterally displaced. Regular rate & rhythm. No rubs or gallops; +3/6 systolic murmur  Lungs: clear  Abdomen: soft, nontender, mildly distended. No hepatosplenomegaly. No bruits or masses. Hypoactive bowel sounds.  Extremities: no cyanosis, clubbing, rash, edema; cool extremities  Neuro: alert & orientedx3, cranial nerves grossly intact. Moves all 4 extremities w/o difficulty. Affect pleasant.   Labs: Basic Metabolic Panel:  Recent Labs Lab 07/02/13 1619 07/05/13 1609 07/06/13 0220 07/07/13 0320 07/08/13 0609  NA 140 142 138 136* 137  K 3.9 3.7 3.2* 3.6* 4.2  CL 100 93* 91* 91* 94*  CO2 27 36* 32 32 27  GLUCOSE 140* 230* 177* 169* 105*  BUN 21 20 19 22  29*  CREATININE 0.84 0.96 0.85 0.84 0.72  CALCIUM 9.3 9.7 9.2 8.8 8.8  MG  --   --  1.2*  --   --   PHOS  --   --  3.7  --   --     Liver Function Tests:  Recent Labs Lab 07/05/13 1609 07/06/13 0220  AST 26 26  ALT 12 10  ALKPHOS 201* 192*  BILITOT 2.4* 2.5*  PROT 7.7 7.4  ALBUMIN 3.5 3.4*   No results found for this basename: LIPASE, AMYLASE,  in the last  168 hours  Recent Labs Lab 07/07/13 0320  AMMONIA 27    CBC:  Recent Labs Lab 07/05/13 1609 07/06/13 0220 07/06/13 0840 07/06/13 2055 07/08/13 0609  WBC 12.9* 9.3 9.4 7.6 10.9*  NEUTROABS 11.3*  --   --   --   --   HGB 17.3* 17.3* 16.6 16.3 18.1*  HCT 50.0 48.6 46.1 46.1 51.3  MCV 91.9 90.0 90.6 90.7 91.3  PLT 131* 114* 118* 119* 129*    Cardiac Enzymes:  Recent Labs Lab 07/06/13 0220 07/06/13 0657 07/06/13 1334  TROPONINI <0.30 <0.30 <0.30    BNP: BNP (last 3 results)  Recent Labs  02/13/13 2346 07/05/13 1609  PROBNP 7676.0* 14925.0*      Imaging: Dg Abd Portable 1v  07/07/2013   CLINICAL DATA:  Possible ileus.  EXAM: PORTABLE ABDOMEN - 1 VIEW  COMPARISON:  CT ABD/PELVIS W CM dated  07/05/2013  FINDINGS: Single supine view of the abdomen and pelvis. Contrast within the colon. Paucity of small bowel gas. No evidence of bowel obstruction or ileus. No definite abnormal abdominal calcifications. No pneumatosis or free intraperitoneal air.  IMPRESSION: No evidence of ileus.   Electronically Signed   By: Jeronimo Greaves M.D.   On: 07/07/2013 19:38     Medications:     Scheduled Medications: . carvedilol  6.25 mg Oral BID WC  . digoxin  0.125 mg Oral Daily  . guaiFENesin  600 mg Oral BID  . insulin aspart  0-9 Units Subcutaneous TID WC  . insulin glargine  5 Units Subcutaneous QHS  . lisinopril  5 mg Oral Daily  . pantoprazole  40 mg Oral Q0600  . sertraline  50 mg Oral Daily  . simvastatin  5 mg Oral QHS    Infusions:    PRN Medications: acetaminophen, acetaminophen, guaiFENesin-dextromethorphan, HYDROcodone-acetaminophen, ondansetron (ZOFRAN) IV, ondansetron, promethazine, zolpidem   Assessment:   1) N/V/D 2) Severe biventricular HF, EF 10% 3) NICM 4) Diabetic retinopathy 5) Nodular liver on imaging, likely cirrhosis. ?ETOH-related.   Plan/Discussion:    Transferred to telemetry yesterday. Yesterday developed nausea again and diet changed back to liquids. Concerned that some of his nausea maybe coming from low output. Volume appears at baseline, will continue to hold diuretics another day. Cr stable will plan for RHC tomorrow to assess hemodynamics and whether patient could benefit from inotropic therapy.  Will increase lisinopril to 10 mg daily and continue to follow Cr. Would not titrate BB with concerns of low output.  GI also consulted and possible EGD on Friday after Plavix held for 5 days.   Length of Stay: 3  Aundria Rud NP-C 07/08/2013, 8:40 AM  Advanced Heart Failure Team Pager [phone] (M-F; 7a - 4p)  Please contact [facility] for night-coverage after hours (4p -7a ) and weekends on [email or web address]  Patient seen with NP, agree with the  above note.  He feels ok today, not short of breath and no nausea/vomiting.   He does not appear markedly volume overloaded on exam, JVP around 8 cm.   Today will start spironolactone 12.5 mg daily.  Continue current Coreg and lisinopril.  Will plan RHC to assess filling pressure and output (he had LHC in 2013 with apparently normal coronaries at [facility]).  ? If low output state contributes to his symptoms.  He would not be candidate at this time for advanced therapies given his diabetic retinopathy with near-blindness.  Will decide on diuretics after cath.   Coffee grounds emesis prior to admission  with nausea/vomiting.  Hemoglobin stable.  Plan EGD Friday after Plavix is out of system.   Marca Ancona 07/08/2013 9:22 AM

## 2013-07-08 NOTE — Progress Notes (Signed)
[facility] [place] TEAM 1 - Stepdown/ICU TEAM  Gary Gallegos LFY:[id number] DOB: July 14, 1945 DOA: 07/05/2013 PCP: Janell Quiet, FNP  Brief narrative: 68 year old male patient with known diabetes and severe diabetic retinopathy - has cataracts and is legally blind also history of hypertension and severe systolic dysfunction with an EF of 10% and is status post ICD. He presented to the hospital because of weakness. 4 days prior to admission his Lasix was discontinued and he was switched to torsemide with potassium and subsequently developed anorexia and poor oral intake. Upon presentation to the ER he was found to have a fever of 102.1 and a cough. Chest x-ray was negative for infiltrate - a flu PCR was negative. According to the family the patient had coffee-ground emesis x 2. CT scan performed in the ER demonstrated cirrhotic changes to the liver which apparently is a new diagnosis for him. The family endorsed patient has a prior history of heavy alcohol use.   Assessment/Plan:  Acute gastroenteritis/Nausea vomiting and diarrhea -C. Difficile neg- enterovirus and norovirus panel pending- fecal lactoferrin positive which is c/w acute infectious process -Continue supportive care -low-grade fevers have resolved -no abdominal pain or clinical signs consistent with SBP  Dehydration -Home weight 170 pounds prior to onset of this illness -Heart failure clinic visit 06/29/2013 demonstrated a weight of 148 pounds and Hospital weight 139 pounds -Given severely reduced ejection fraction since patient's renal function and BP are stable and appears euvolemic at this point would defer IV fluids and allow by mouth intake only to support volume -Held diuretics initially but Cards resuming Spironolactone 1/21 -Hemoglobin is at baseline which is between 16 and 17    Coffee ground emesis -Appreciate GI evaluation -EGD eventually this admission-GI clarified pt was NOT on Plavix 5 pre  admit-EGD planned for 1/22 -GI advanced to solid diet -no further N/V- XR unremarkable  Respiratory failure with hypoxia -Primarily appears to be related to hypoventilation and likely can wean oxygen  Chronic severe systolic heart failure - EF 10% -Appreciate cardiologist assistance -K low 1/19 so held dig  -Cards resuming Spironolactone -was new pt to OP CHF Clinic- only one visit pre admit- rec RHF at some point to assess hemodynamics and possible inotrope rxn-would not be a candidate for advanced therapies given diabetic retinopathy/near blindness  Cirrhosis / questionable liver mass / thrombocytopenia -GI also assisting with this problem -family has requested Palliative meeting for GOC  Diabetes mellitus/poorly controlled -Current CBGs controlled but HgbA1c high at 10.9 -Lantus prior to admission- resumed at lower dose 1/20- not consistently high and since variable intake would defer increase Lantus for now -continue low dose with SSI  HTN (hypertension) -Carvedilol and Prinivil at lower dosage than pre-admission per Cards  DVT prophylaxis: SCDs Code Status: Full Family Communication: No family at bedside Disposition Plan/Expected LOS: tele bed   Consultants: Gastroenterology Cardiology Palliative Care   Procedures: None  Antibiotics: None  HPI/Subjective: Awake but confused. Denies shortness of breath or chest pain denies abdominal pain.  Objective: Blood pressure 122/74, pulse 80, temperature 98.3 F (36.8 C), temperature source Oral, resp. rate 18, height 5\' 9"  (1.753 m), weight 136 lb 11.2 oz (62.007 kg), SpO2 98.00%.  Intake/Output Summary (Last 24 hours) at 07/08/13 1430 Last data filed at 07/08/13 0608  Gross per 24 hour  Intake    338 ml  Output      0 ml  Net    338 ml   Exam: General: No acute respiratory distress Lungs: Clear  to auscultation bilaterally without wheezes or crackles, RA Cardiovascular: Regular rate and rhythm without murmur  gallop or rub normal S1 and S2, no peripheral edema Abdomen: Nontender, nondistended, soft, bowel sounds positive, no rebound, no ascites, no appreciable mass Musculoskeletal: No significant cyanosis, clubbing of bilateral lower extremities Neurological: Awake and oriented to at least name, moves all extremities x 4 weakly without focal neurological deficits  Scheduled Meds:  Scheduled Meds: . carvedilol  6.25 mg Oral BID WC  . digoxin  0.125 mg Oral Daily  . guaiFENesin  600 mg Oral BID  . insulin aspart  0-9 Units Subcutaneous TID WC  . insulin glargine  5 Units Subcutaneous QHS  . lisinopril  10 mg Oral Daily  . pantoprazole  40 mg Oral Q0600  . sertraline  50 mg Oral Daily  . simethicone  160 mg Oral Once  . simvastatin  5 mg Oral QHS  . sodium chloride  3 mL Intravenous Q12H  . spironolactone  12.5 mg Oral Daily    Data Reviewed: Basic Metabolic Panel:  Recent Labs Lab 07/02/13 1619 07/05/13 1609 07/06/13 0220 07/07/13 0320 07/08/13 0609  NA 140 142 138 136* 137  K 3.9 3.7 3.2* 3.6* 4.2  CL 100 93* 91* 91* 94*  CO2 27 36* 32 32 27  GLUCOSE 140* 230* 177* 169* 105*  BUN 21 20 19 22  29*  CREATININE 0.84 0.96 0.85 0.84 0.72  CALCIUM 9.3 9.7 9.2 8.8 8.8  MG  --   --  1.2*  --   --   PHOS  --   --  3.7  --   --    Liver Function Tests:  Recent Labs Lab 07/05/13 1609 07/06/13 0220  AST 26 26  ALT 12 10  ALKPHOS 201* 192*  BILITOT 2.4* 2.5*  PROT 7.7 7.4  ALBUMIN 3.5 3.4*   CBC:  Recent Labs Lab 07/05/13 1609 07/06/13 0220 07/06/13 0840 07/06/13 2055 07/08/13 0609  WBC 12.9* 9.3 9.4 7.6 10.9*  NEUTROABS 11.3*  --   --   --   --   HGB 17.3* 17.3* 16.6 16.3 18.1*  HCT 50.0 48.6 46.1 46.1 51.3  MCV 91.9 90.0 90.6 90.7 91.3  PLT 131* 114* 118* 119* 129*   Cardiac Enzymes:  Recent Labs Lab 07/06/13 0220 07/06/13 0657 07/06/13 1334  TROPONINI <0.30 <0.30 <0.30   BNP (last 3 results)  Recent Labs  02/13/13 2346 07/05/13 1609  PROBNP 7676.0*  14925.0*   CBG:  Recent Labs Lab 07/07/13 1223 07/07/13 1507 07/07/13 2137 07/08/13 0606 07/08/13 1109  GLUCAP 179* 149* 131* 115* 212*    Recent Results (from the past 240 hour(s))  MRSA PCR SCREENING     Status: None   Collection Time    07/06/13  1:07 AM      Result Value Range Status   MRSA by PCR NEGATIVE  NEGATIVE Final   Comment:            The GeneXpert MRSA Assay (FDA     approved for NASAL specimens     only), is one component of a     comprehensive MRSA colonization     surveillance program. It is not     intended to diagnose MRSA     infection nor to guide or     monitor treatment for     MRSA infections.  STOOL CULTURE     Status: None   Collection Time    07/06/13 10:00 PM  Result Value Range Status   Specimen Description STOOL   Final   Special Requests Normal   Final   Culture     Final   Value: NO SUSPICIOUS COLONIES, CONTINUING TO HOLD     Performed at [facility]   Report Status PENDING   Incomplete  CLOSTRIDIUM DIFFICILE BY PCR     Status: None   Collection Time    07/06/13 10:00 PM      Result Value Range Status   C difficile by pcr NEGATIVE  NEGATIVE Final     Studies:  Recent x-ray studies have been reviewed in detail by the Attending Physician  Time spent : 25 minutes     Junious Silk, ANP [facility] Office  [phone] Pager [phone]  **If unable to reach the above provider after paging please contact the Flow Manager @ [phone]  On-Call/Text Page:      [email or web address]      password [email or web address]  If 7PM-7AM, please contact night-coverage [email or web address] Password [email or web address] 07/08/2013, 2:30 PM   LOS: 3 days   I have personally examined this patient and reviewed the entire database. I have reviewed the above note, made any necessary editorial changes, and agree with its content.  Lonia Blood, MD [facility]

## 2013-07-08 NOTE — CV Procedure (Signed)
   Cardiac Catheterization Procedure Note  Name: Gary Gallegos MRN: [id number] DOB: 1945-08-04  Procedure: Right Heart Cath  Indication: Low output heart failure.    Procedural Details: The right groin was prepped, draped, and anesthetized with 1% lidocaine. Using the modified Seldinger technique a  7 French sheath was placed in the right femoral vein. A Swan-Ganz catheter was used for the right heart catheterization. Standard protocol was followed for recording of right heart pressures and sampling of oxygen saturations. Fick and thermodilution cardiac output was calculated. There were no immediate procedural complications. The patient was transferred to the post catheterization recovery area for further monitoring.  Procedural Findings: Hemodynamics (mmHg) RA mean 9 RV 49/7 PA 44/16, mean 26 PCWP mean 14  Oxygen saturations: PA 68% AO 97%  Cardiac Output (Fick) 3.28  Cardiac Index (Fick) 1.86 PVR 3.3 WU  Cardiac Output (Thermo) 2.72 Cardiac Index (Thermo) 1.55  PVR 4.4 WU  Final Conclusions:  Right and left heart filling pressures are not significantly elevated.  Cardiac index is low.  Suspect that low output heart failure plays a role in his malaise and nausea.   - Can restart home diuretic regimen.  - Start milrinone 0.25 mcg/kg/min - Place PICC for co-ox monitoring.  - Possibly home on palliative milrinone.   Marca Ancona 07/08/2013, 4:12 PM

## 2013-07-08 NOTE — Progress Notes (Signed)
Back  In room from heart cath. Awake / alert Rt Groin level 0 Daughter at bedside

## 2013-07-08 NOTE — Progress Notes (Signed)
Daily Rounding Note  07/08/2013, 11:20 AM  LOS: 3 days   SUBJECTIVE:       He denies nausea but notes mention nausea recurred yesterday with advance to solid food.   OBJECTIVE:         Vital signs in last 24 hours:    Temp:  [98 F (36.7 C)-98.8 F (37.1 C)] 98.3 F (36.8 C) (01/21 0900) Pulse Rate:  [73-91] 77 (01/21 0900) Resp:  [15-20] 18 (01/21 0900) BP: (97-124)/(71-79) 122/74 mmHg (01/21 0900) SpO2:  [97 %-100 %] 98 % (01/21 0900) Weight:  [62.007 kg (136 lb 11.2 oz)-62.052 kg (136 lb 12.8 oz)] 62.007 kg (136 lb 11.2 oz) (01/21 0643) Last BM Date: 07/07/13 General: comfortable , pleasant, not acutely ill  Heart: RRR, 3/6 Systolic murmer Chest: clear, no cough or labored breathing Abdomen: soft, ND, hypoactive BS.  NT  Extremities: no CCE Neuro/Psych:  Pleasant, oriented x 3.  Appropriate and alert.   Intake/Output from previous day: 01/20 0701 - 01/21 0700 In: 338 [P.O.:338] Out: 1 [Stool:1]  Intake/Output this shift:    Lab Results:  Recent Labs  07/06/13 0840 07/06/13 2055 07/08/13 0609  WBC 9.4 7.6 10.9*  HGB 16.6 16.3 18.1*  HCT 46.1 46.1 51.3  PLT 118* 119* 129*   BMET  Recent Labs  07/06/13 0220 07/07/13 0320 07/08/13 0609  NA 138 136* 137  K 3.2* 3.6* 4.2  CL 91* 91* 94*  CO2 32 32 27  GLUCOSE 177* 169* 105*  BUN 19 22 29*  CREATININE 0.85 0.84 0.72  CALCIUM 9.2 8.8 8.8   LFT  Recent Labs  07/05/13 1609 07/06/13 0220  PROT 7.7 7.4  ALBUMIN 3.5 3.4*  AST 26 26  ALT 12 10  ALKPHOS 201* 192*  BILITOT 2.4* 2.5*   PT/INR  Recent Labs  07/06/13 0220  LABPROT 17.4*  INR 1.47   Hepatitis Panel  Recent Labs  07/06/13 0220  HEPBSAG NEGATIVE  HCVAB NEGATIVE  HEPAIGM NON REACTIVE  HEPBIGM NON REACTIVE    Studies/Results: Dg Abd Portable 1v  07/07/2013   CLINICAL DATA:  Possible ileus.  EXAM: PORTABLE ABDOMEN - 1 VIEW  COMPARISON:  CT ABD/PELVIS W CM dated  07/05/2013  FINDINGS: Single supine view of the abdomen and pelvis. Contrast within the colon. Paucity of small bowel gas. No evidence of bowel obstruction or ileus. No definite abnormal abdominal calcifications. No pneumatosis or free intraperitoneal air.  IMPRESSION: No evidence of ileus.   Electronically Signed   By: Abigail Miyamoto M.D.   On: 07/07/2013 19:38    ASSESMENT:   * N/V/D, hiccups. Rule out GERD, rule out PUD. Rule out gastroparesis.  CG emesis witrh FOB negative stool. Rule out portal gastropathy. Doubt variceal bleed.  Hiccups and nausea resolved. On once daily Protonix po.  * New diagnoses of nodular liver, likely cirrhosis. Hx ETOH abuse as well as end stage NICM both could be causes.  Alk phos and T bili elevated.  Hepatitis serologies negative/non-reactive.  *  Anemia, normocytic.  No transfusions so far.  * Thrombocytopenia. C/w liver disease.  * Coagulopathy. PT elevated, INR normal.  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 * Type 2 IDDM. Insulin dependent . A1C in 02/2013 was 11.0, 10.9 on 07/06/13 * Diagetic retinopathy, has caused blindness.      PLAN   *  EGD on 1/22. At 1 PM In previous notes I mistakenly stated pt was on Plavix,  I have reviewed records and he was/is not taking this.  *  Goals of care consult.  D/w patient and his daughter, son (via sketchy phone connection) and Dtr and pt would appreciate a meeting re goals of cre.  1/23 works best for the dtr.  Dr Billee Cashing Clung aware and will place consult.     Azucena Freed  07/08/2013, 11:20 AM Attending MD note:   I have taken a history, examined the patient, and reviewed the chart. I agree with the Advanced Practitioner's impression and recommendations. EGD discussed with the pt. Heme positive anemia. R/o esophageal varices ( cirrhosis on sono).  [facility] Pager # [phone]  Pager: [phone]

## 2013-07-09 ENCOUNTER — Encounter (HOSPITAL_COMMUNITY)
Admission: EM | Disposition: A | Discharge status: Home Health | Payer: Self-pay | Source: Home / Self Care | Attending: Internal Medicine

## 2013-07-09 ENCOUNTER — Encounter (HOSPITAL_COMMUNITY): Payer: Self-pay | Admitting: *Deleted

## 2013-07-09 DIAGNOSIS — I5023 Acute on chronic systolic (congestive) heart failure: Secondary | ICD-10-CM

## 2013-07-09 DIAGNOSIS — R5383 Other fatigue: Secondary | ICD-10-CM

## 2013-07-09 DIAGNOSIS — R5381 Other malaise: Secondary | ICD-10-CM

## 2013-07-09 DIAGNOSIS — I509 Heart failure, unspecified: Secondary | ICD-10-CM

## 2013-07-09 DIAGNOSIS — K2100 Gastro-esophageal reflux disease with esophagitis, without bleeding: Secondary | ICD-10-CM

## 2013-07-09 DIAGNOSIS — Z515 Encounter for palliative care: Secondary | ICD-10-CM

## 2013-07-09 DIAGNOSIS — K920 Hematemesis: Secondary | ICD-10-CM

## 2013-07-09 HISTORY — PX: ESOPHAGOGASTRODUODENOSCOPY: SHX5428

## 2013-07-09 LAB — BASIC METABOLIC PANEL
BUN: 31 mg/dL — ABNORMAL HIGH (ref 6–23)
CO2: 29 mEq/L (ref 19–32)
Calcium: 8.6 mg/dL (ref 8.4–10.5)
Chloride: 93 mEq/L — ABNORMAL LOW (ref 96–112)
Creatinine, Ser: 0.73 mg/dL (ref 0.50–1.35)
Glucose, Bld: 213 mg/dL — ABNORMAL HIGH (ref 70–99)
POTASSIUM: 3.7 meq/L (ref 3.7–5.3)
SODIUM: 135 meq/L — AB (ref 137–147)

## 2013-07-09 LAB — GLUCOSE, CAPILLARY
GLUCOSE-CAPILLARY: 217 mg/dL — AB (ref 70–99)
GLUCOSE-CAPILLARY: 263 mg/dL — AB (ref 70–99)
GLUCOSE-CAPILLARY: 185 mg/dL — AB (ref 70–99)
Glucose-Capillary: 166 mg/dL — ABNORMAL HIGH (ref 70–99)

## 2013-07-09 SURGERY — EGD (ESOPHAGOGASTRODUODENOSCOPY)
Anesthesia: Moderate Sedation

## 2013-07-09 MED ORDER — PANTOPRAZOLE SODIUM 20 MG PO TBEC
20.0000 mg | DELAYED_RELEASE_TABLET | Freq: Every day | ORAL | Status: DC
  Administered 2013-07-09 – 2013-07-10 (×2): 20 mg via ORAL
  Filled 2013-07-09 (×2): qty 1

## 2013-07-09 MED ORDER — SODIUM CHLORIDE 0.9 % IJ SOLN
10.0000 mL | INTRAMUSCULAR | Status: DC | PRN
  Administered 2013-07-10 – 2013-07-11 (×4): 10 mL

## 2013-07-09 MED ORDER — FENTANYL CITRATE 0.05 MG/ML IJ SOLN
INTRAMUSCULAR | Status: AC
  Filled 2013-07-09: qty 2

## 2013-07-09 MED ORDER — DIPHENHYDRAMINE HCL 50 MG/ML IJ SOLN
INTRAMUSCULAR | Status: AC
  Filled 2013-07-09: qty 1

## 2013-07-09 MED ORDER — MIDAZOLAM HCL 5 MG/ML IJ SOLN
INTRAMUSCULAR | Status: AC
  Filled 2013-07-09: qty 2

## 2013-07-09 MED ORDER — MIDAZOLAM HCL 10 MG/2ML IJ SOLN
INTRAMUSCULAR | Status: DC | PRN
  Administered 2013-07-09: 2 mg via INTRAVENOUS

## 2013-07-09 MED ORDER — BUTAMBEN-TETRACAINE-BENZOCAINE 2-2-14 % EX AERO
INHALATION_SPRAY | CUTANEOUS | Status: DC | PRN
  Administered 2013-07-09: 2 via TOPICAL

## 2013-07-09 MED ORDER — FENTANYL CITRATE 0.05 MG/ML IJ SOLN
INTRAMUSCULAR | Status: DC | PRN
  Administered 2013-07-09: 25 ug via INTRAVENOUS

## 2013-07-09 NOTE — Evaluation (Signed)
Physical Therapy Evaluation Patient Details Name: Gary Gallegos MRN: [id number] DOB: [birth year] Today's Date: 07/09/2013 Time: 6384-6659 PT Time Calculation (min): 24 min  PT Assessment / Plan / Recommendation History of Present Illness  Patient was doing well 4 days ago his lasix was discontinued and he was switched to torsemide and added potassium. Next day he started to have poor PO intake, he started to have nausea, vomiting, and diarrhea. Patient became profoundly weak. He started to have cough and fever up to 102.1. Reports myalgias. CXR negative for infiltrate. Flu PCR was negative. Hospitalist called for admission.   Clinical Impression  Pt with weakness and decreased balance requiring min A for gait and mobility.  Will benefit from skilled PT to address deficits and increase functional independence.    PT Assessment  Patient needs continued PT services    Follow Up Recommendations  Home health PT;Supervision/Assistance - 24 hour    Does the patient have the potential to tolerate intense rehabilitation      Barriers to Discharge        Equipment Recommendations  Rolling walker with 5" wheels    Recommendations for Other Services     Frequency Min 3X/week    Precautions / Restrictions Precautions Precautions: Fall Precaution Comments: pt is legally blind! Restrictions Weight Bearing Restrictions: No   Pertinent Vitals/Pain No c/o pain      Mobility  Bed Mobility Overal bed mobility: Needs Assistance Bed Mobility: Supine to Sit Supine to sit: Min assist General bed mobility comments: assist for trunk Transfers Overall transfer level: Needs assistance Equipment used: Rolling walker (2 wheeled) Transfers: Sit to/from Stand Sit to Stand: Min assist General transfer comment: needs assist for lifting, cues for hand placement Ambulation/Gait Ambulation/Gait assistance: Min assist Ambulation Distance (Feet): 50 Feet Assistive device: Rolling walker (2  wheeled) Gait velocity interpretation: <1.8 ft/sec, indicative of risk for recurrent falls General Gait Details: limited by weakness, needs assist for balance    Exercises     PT Diagnosis: Difficulty walking;Generalized weakness  PT Problem List: Decreased strength;Decreased mobility;Decreased activity tolerance;Decreased balance;Decreased knowledge of use of DME;Cardiopulmonary status limiting activity PT Treatment Interventions: DME instruction;Therapeutic exercise;Wheelchair mobility training;Gait training;Balance training;Stair training;Neuromuscular re-education;Modalities;Functional mobility training;Therapeutic activities;Patient/family education     PT Goals(Current goals can be found in the care plan section) Acute Rehab PT Goals Patient Stated Goal: go home PT Goal Formulation: With patient Time For Goal Achievement: 07/16/13 Potential to Achieve Goals: Good  Visit Information  Last PT Received On: 07/09/13 Assistance Needed: +1 History of Present Illness: Patient was doing well 4 days ago his lasix was discontinued and he was switched to torsemide and added potassium. Next day he started to have poor PO intake, he started to have nausea, vomiting, and diarrhea. Patient became profoundly weak. He started to have cough and fever up to 102.1. Reports myalgias. CXR negative for infiltrate. Flu PCR was negative. Hospitalist called for admission.        Prior Functioning  Home Living Family/patient expects to be discharged to:: Private residence Living Arrangements: Children Available Help at Discharge: Family;Available 24 hours/day Type of Home: Apartment Home Access: Stairs to enter Entrance Stairs-Number of Steps: 2 Home Layout: One level Home Equipment: Walker - 2 wheels Prior Function Level of Independence: Needs assistance Gait / Transfers Assistance Needed: uses RW Comments: needs assist due to blindness Communication Communication: No difficulties     Cognition  Cognition Arousal/Alertness: Awake/alert Behavior During Therapy: WFL for tasks assessed/performed Overall Cognitive Status: Within Functional Limits  for tasks assessed    Extremity/Trunk Assessment Upper Extremity Assessment Upper Extremity Assessment: Generalized weakness Lower Extremity Assessment Lower Extremity Assessment: Generalized weakness Cervical / Trunk Assessment Cervical / Trunk Assessment: Normal   Balance    End of Session PT - End of Session Equipment Utilized During Treatment: Gait belt Activity Tolerance: Patient tolerated treatment well Patient left: in chair;with call bell/phone within reach;with nursing/sitter in room Nurse Communication: Mobility status  GP     Gary Gallegos 07/09/2013, 11:50 AM

## 2013-07-09 NOTE — Progress Notes (Signed)
Advanced Heart Failure Rounding Note  Primary Physician: Janell Quiet, FNP  Primary Cardiologist: Arvilla Meres  Primary Electrophysiologist: None  Subjective:   Gary Gallegos is a 68 yo male referred to the HF clinic by Dr.Nelson. 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   Started having problems with his heart in early 2013. Was followed by cardiologist at [facility]. Had cath in 2013 and arteries were "fine"  ECHO 05/05/13: EF 10%, diff HK, grade II DD, mild MR, LA mod dilated, RV mildly dilated and sys fx mod reduced, mod TR   Admitted for N/V/D, fever, myalgias and coffee ground emesis (1/18) GI consulted and plan is for EGD 1/23 after off Plavix for 5 days. C-diff negative. Flu and noravirus negative. Taken for RHC yesterday:  RA mean 9  RV 49/7  PA 44/16, mean 26  PCWP mean 14  Oxygen saturations:  PA 68%  AO 97%  Cardiac Output (Fick) 3.28  Cardiac Index (Fick) 1.86  PVR 3.3 WU  Cardiac Output (Thermo) 2.72  Cardiac Index (Thermo) 1.55  PVR 4.4 WU  Started on milrinone 0.25 and restarted home diuretics. Palliative consult. Weight up 1 lb, 24 hr I/O -736. Going for EGD today. Denies SOB, orthopnea or CP.   Objective:   Weight Range:  Vital Signs:   Temp:  [97.9 F (36.6 C)-98.7 F (37.1 C)] 98 F (36.7 C) (01/22 0726) Pulse Rate:  [75-86] 78 (01/22 0644) Resp:  [18] 18 (01/22 0726) BP: (87-125)/(55-87) 99/59 mmHg (01/22 0726) SpO2:  [96 %-99 %] 98 % (01/22 0726) Weight:  [137 lb 14.4 oz (62.551 kg)] 137 lb 14.4 oz (62.551 kg) (01/22 0644) Last BM Date: 07/07/13  Weight change: Filed Weights   07/07/13 2032 07/08/13 0643 07/09/13 0644  Weight: 136 lb 12.8 oz (62.052 kg) 136 lb 11.2 oz (62.007 kg) 137 lb 14.4 oz (62.551 kg)    Intake/Output:   Intake/Output Summary (Last 24 hours) at 07/09/13 1135 Last data filed at 07/09/13 0644  Gross per 24 hour  Intake    240 ml  Output   1226 ml  Net   -986 ml     Physical Exam: General: Chronically ill appearing. No resp difficulty, lying in bed HEENT: normal; arcus senilus. Lenses don't appear clouded  Neck: supple. JVP flat with prominent CV wave. Carotids 2+ bilaterally; no bruits. No lymphadenopathy or thryomegaly appreciated.  Cor: PMI laterally displaced. Regular rate & rhythm. No rubs or gallops; +3/6 systolic murmur  Lungs: clear  Abdomen: soft, nontender, mildly distended. No hepatosplenomegaly. No bruits or masses. Hypoactive bowel sounds.  Extremities: no cyanosis, clubbing, rash, edema; cool extremities  Neuro: alert & orientedx3, cranial nerves grossly intact. Moves all 4 extremities w/o difficulty. Affect pleasant.   Labs: Basic Metabolic Panel:  Recent Labs Lab 07/05/13 1609 07/06/13 0220 07/07/13 0320 07/08/13 0609 07/09/13 0331  NA 142 138 136* 137 135*  K 3.7 3.2* 3.6* 4.2 3.7  CL 93* 91* 91* 94* 93*  CO2 36* 32 32 27 29  GLUCOSE 230* 177* 169* 105* 213*  BUN 20 19 22  29* 31*  CREATININE 0.96 0.85 0.84 0.72 0.73  CALCIUM 9.7 9.2 8.8 8.8 8.6  MG  --  1.2*  --   --   --   PHOS  --  3.7  --   --   --     Liver Function Tests:  Recent Labs Lab 07/05/13 1609 07/06/13 0220  AST 26 26  ALT 12 10  ALKPHOS 201* 192*  BILITOT 2.4* 2.5*  PROT 7.7 7.4  ALBUMIN 3.5 3.4*   No results found for this basename: LIPASE, AMYLASE,  in the last 168 hours  Recent Labs Lab 07/07/13 0320  AMMONIA 27    CBC:  Recent Labs Lab 07/05/13 1609 07/06/13 0220 07/06/13 0840 07/06/13 2055 07/08/13 0609  WBC 12.9* 9.3 9.4 7.6 10.9*  NEUTROABS 11.3*  --   --   --   --   HGB 17.3* 17.3* 16.6 16.3 18.1*  HCT 50.0 48.6 46.1 46.1 51.3  MCV 91.9 90.0 90.6 90.7 91.3  PLT 131* 114* 118* 119* 129*    Cardiac Enzymes:  Recent Labs Lab 07/06/13 0220 07/06/13 0657 07/06/13 1334  TROPONINI <0.30 <0.30 <0.30    BNP: BNP (last 3 results)  Recent  Labs  02/13/13 2346 07/05/13 1609  PROBNP 7676.0* 14925.0*      Imaging: Dg Abd Portable 1v  07/07/2013   CLINICAL DATA:  Possible ileus.  EXAM: PORTABLE ABDOMEN - 1 VIEW  COMPARISON:  CT ABD/PELVIS W CM dated 07/05/2013  FINDINGS: Single supine view of the abdomen and pelvis. Contrast within the colon. Paucity of small bowel gas. No evidence of bowel obstruction or ileus. No definite abnormal abdominal calcifications. No pneumatosis or free intraperitoneal air.  IMPRESSION: No evidence of ileus.   Electronically Signed   By: Jeronimo GreavesKyle  Talbot M.D.   On: 07/07/2013 19:38     Medications:     Scheduled Medications: . carvedilol  6.25 mg Oral BID WC  . digoxin  0.125 mg Oral Daily  . guaiFENesin  600 mg Oral BID  . heparin  5,000 Units Subcutaneous Q8H  . insulin aspart  0-9 Units Subcutaneous TID WC  . insulin glargine  5 Units Subcutaneous QHS  . lisinopril  10 mg Oral Daily  . pantoprazole  40 mg Oral Q0600  . sertraline  50 mg Oral Daily  . simvastatin  5 mg Oral QHS  . sodium chloride  3 mL Intravenous Q12H  . spironolactone  12.5 mg Oral Daily  . torsemide  60 mg Oral Daily    Infusions: . milrinone 0.25 mcg/kg/min (07/08/13 1956)    PRN Medications: sodium chloride, acetaminophen, guaiFENesin-dextromethorphan, HYDROcodone-acetaminophen, ondansetron (ZOFRAN) IV, ondansetron, promethazine, simethicone, sodium chloride, zolpidem   Assessment:   1) N/V/D 2) Severe biventricular HF, EF 10% 3) Cardiogenic shock 3) NICM 4) Diabetic retinopathy 5) Nodular liver on imaging, likely cirrhosis. ?ETOH-related.   Plan/Discussion:    Went for RHC yesterday showing low-output and was started on milrinone. Awaiting PICC placement for co-oxs and home inotropes. Very difficult situation. Gary Gallegos is suffering from biventricular HF with low output along with severe diabetic retinopathy almost blind. He is likely not a transplant candidate with renal function and severe DM and  unfortunately is not a LVAD candidate d/t biventricular HF. Lengthy discussion with patient, daughter and son about home inotropes and they would like to proceed. They understand that this has not shown to improve quantity of life, but has shown to hopefully improve quality. Palliative Consult has been placed and they will meet today at 1:00 pm.  Volume status is stable will continue PO diuretics and follow Cr.   Not on BB with cardiogenic shock. Will not titrate ACE-I with low SBP. Eventually would like to get Cleda DaubSpiro on board, will hold off currently.  EGD today to assess abdominal pain with  N/V/D.    Length of Stay: 4  Aundria Rud NP-C 07/09/2013, 11:35 AM  Advanced Heart Failure Team Pager [phone] (M-F; 7a - 4p)  Please contact [facility] for night-coverage after hours (4p -7a ) and weekends on [email or web address]  Agree with above.   Truman Hayward 7:21 PM

## 2013-07-09 NOTE — Progress Notes (Signed)
Utilization Review Completed Molly Maselli J. Deroy Noah, RN, BSN, NCM [phone]  

## 2013-07-09 NOTE — Op Note (Signed)
[facility] [address] [place], [address]   ENDOSCOPY PROCEDURE REPORT  PATIENT: Gary Gallegos, Gary Gallegos  MR#: [id number] BIRTHDATE: [birth year] , 67  yrs. old GENDER: Male ENDOSCOPIST: Hart Carwin, MD REFERRED BY Dr David Stall PROCEDURE DATE:  07/09/2013 PROCEDURE:  EGD w/ biopsy ASA CLASS:     Class III INDICATIONS:  Hematemesis.   Vomiting. MEDICATIONS: These medications were titrated to patient response per physician's verbal order, Versed 2 mg IV, Propofol (Diprivan), and Fentanyl 25 mcg IV TOPICAL ANESTHETIC: Cetacaine Spray  DESCRIPTION OF PROCEDURE: After the risks benefits and alternatives of the procedure were thoroughly explained, informed consent was obtained.  The PENTAX GASTOROSCOPE [id number] endoscope was introduced through the mouth and advanced to the second portion of the duodenum. Without limitations.  The instrument was slowly withdrawn as the mucosa was fully examined.      Esophagus: third of the esophagus was normal. There were no esophageal varices. There were extensive ulcerations in the distal esophagus with adherent exudate but no stricture. There were no varices in distal esophagus. Endoscope traversed in to the stomach without difficulty. There was no evidence of Mallory-Weiss tear. Biopsies were taken from irregular Z line to rule out Barrett's esophagus the stomach: Gastric mucosa was diffusely erythematous but then there were no erosions or ulcerations. There were no gastric varices . Retroflexion of the endoscope revealed normal fundus and cardia. Pyloric outlet was normalDuodenum: Duodenal bulb and descending duodenum was normal[   biopsies were taken from the gastric antrum to rule out H. pyloric       The scope was then withdrawn from the patient and the procedure completed.  COMPLICATIONS: There were no complications. ENDOSCOPIC IMPRESSION:  grade 4 esophagitis secondary to vomiting and  gastroesophageal refluxNo evidence of esophageal varices Diffuse mild gastritis No active bleeding Patient's hematemesis was likely related to severe esophagitis which is now much better. He will continue on antireflux measures and PPIs we will advance his diet RECOMMENDATIONS: 1.  Anti-reflux regimen to be follow 2.  Continue PPI 3.  Await biopsy results  REPEAT EXAM: for EGD pending biopsy results.  eSigned:  Hart Carwin, MD 07/09/2013 2:31 PM   CC:

## 2013-07-09 NOTE — Progress Notes (Signed)
[facility] PROGRESS NOTE Interim History: 68 year old male patient with known diabetes and severe diabetic retinopathy - has cataracts and is legally blind also history of hypertension and severe systolic dysfunction with an EF of 10% and is status post ICD. He presented to the hospital because of weakness. 4 days prior to admission his Lasix was discontinued and he was switched to torsemide with potassium and subsequently developed anorexia and poor oral intake. Upon presentation to the ER he was found to have a fever of 102.1 and a cough. Chest x-ray was negative for infiltrate - a flu PCR was negative. According to the family the patient had coffee-ground emesis x 2.   Filed Weights   07/07/13 2032 07/08/13 0643 07/09/13 0644  Weight: 62.052 kg (136 lb 12.8 oz) 62.007 kg (136 lb 11.2 oz) 62.551 kg (137 lb 14.4 oz)        Intake/Output Summary (Last 24 hours) at 07/09/13 1309 Last data filed at 07/09/13 0644  Gross per 24 hour  Intake    240 ml  Output    925 ml  Net   -685 ml     Assessment/Plan: Nausea vomiting and diarrhea  - C. Difficile neg - fecal lactoferrin positive which is c/w acute infectious process  - Continue supportive care  - no abdominal pain or clinical signs consistent with SBP.  Coffee ground emesis  - Appreciate GI evaluation  - EGD on 1.22.2014. Pt was NOT on Plavix 5 pre admit  - no further N/V- X-Ray unremarkable   Dehydration  - Home weight 170 pounds prior to onset of this illness  - Heart failure clinic visit 06/29/2013 demonstrated a weight of 148 pounds. - Held diuretics initially but Cards resuming Spironolactone 1/21  - Hemoglobin is at baseline which is between 16 and 17   Respiratory failure with hypoxia  -Primarily appears to be related to hypoventilation and likely can wean oxygen   Chronic severe systolic heart failure - EF 10%  - Appreciate cardiologist assistance  - K low 1/19 so held dig  - Cards resuming Spironolactone  -  Was new pt to [facility]- only one visit pre admit- rec RHF - PICC line and home milrinone. -would not be a candidate for advanced therapies given diabetic retinopathy/near blindness  Cirrhosis / questionable liver mass / thrombocytopenia  -GI also assisting with this problem  - PMT meeting 122.2014.  Diabetes mellitus/poorly controlled  - Current CBGs controlled but HgbA1c high at 10.9  - Lantus prior to admission- resumed at lower dose 1/20- not consistently high and since variable intake would defer increase Lantus for now  - Continue low dose with SSI   HTN (hypertension)  -Carvedilol and Prinivil at lower dosage than pre-admission per Cards    Code Status: Full  Family Communication: No family at bedside  Disposition Plan/Expected LOS: tele bed     Consultants: Gastroenterology  Cardiology  Palliative Care    Procedures: ECHO: none  Antibiotics:  none  HPI/Subjective: No complains  Objective: Filed Vitals:   07/09/13 0155 07/09/13 0644 07/09/13 0726 07/09/13 1229  BP: 87/55 101/62 99/59 95/63   Pulse: 86 78    Temp: 98.1 F (36.7 C) 97.9 F (36.6 C) 98 F (36.7 C) 98 F (36.7 C)  TempSrc: Oral Oral Oral Oral  Resp: 18 18 18 16   Height:      Weight:  62.551 kg (137 lb 14.4 oz)    SpO2: 96% 97% 98% 99%     Exam:  General: Alert, awake, oriented x3, in no acute distress.  HEENT: No bruits, no goiter. +JVD Heart: Regular rate and rhythm, without murmurs, rubs, gallops.  Lungs: Good air movement, bilateral air movement.  Abdomen: Soft, nontender, nondistended, positive bowel sounds.    Data Reviewed: Basic Metabolic Panel:  Recent Labs Lab 07/05/13 1609 07/06/13 0220 07/07/13 0320 07/08/13 0609 07/09/13 0331  NA 142 138 136* 137 135*  K 3.7 3.2* 3.6* 4.2 3.7  CL 93* 91* 91* 94* 93*  CO2 36* 32 32 27 29  GLUCOSE 230* 177* 169* 105* 213*  BUN 20 19 22  29* 31*  CREATININE 0.96 0.85 0.84 0.72 0.73  CALCIUM 9.7 9.2 8.8 8.8 8.6  MG  --   1.2*  --   --   --   PHOS  --  3.7  --   --   --    Liver Function Tests:  Recent Labs Lab 07/05/13 1609 07/06/13 0220  AST 26 26  ALT 12 10  ALKPHOS 201* 192*  BILITOT 2.4* 2.5*  PROT 7.7 7.4  ALBUMIN 3.5 3.4*   No results found for this basename: LIPASE, AMYLASE,  in the last 168 hours  Recent Labs Lab 07/07/13 0320  AMMONIA 27   CBC:  Recent Labs Lab 07/05/13 1609 07/06/13 0220 07/06/13 0840 07/06/13 2055 07/08/13 0609  WBC 12.9* 9.3 9.4 7.6 10.9*  NEUTROABS 11.3*  --   --   --   --   HGB 17.3* 17.3* 16.6 16.3 18.1*  HCT 50.0 48.6 46.1 46.1 51.3  MCV 91.9 90.0 90.6 90.7 91.3  PLT 131* 114* 118* 119* 129*   Cardiac Enzymes:  Recent Labs Lab 07/06/13 0220 07/06/13 0657 07/06/13 1334  TROPONINI <0.30 <0.30 <0.30   BNP (last 3 results)  Recent Labs  02/13/13 2346 07/05/13 1609  PROBNP 7676.0* 14925.0*   CBG:  Recent Labs Lab 07/08/13 1109 07/08/13 1648 07/08/13 2103 07/09/13 0549 07/09/13 1031  GLUCAP 212* 160* 303* 217* 166*    Recent Results (from the past 240 hour(s))  MRSA PCR SCREENING     Status: None   Collection Time    07/06/13  1:07 AM      Result Value Range Status   MRSA by PCR NEGATIVE  NEGATIVE Final   Comment:            The GeneXpert MRSA Assay (FDA     approved for NASAL specimens     only), is one component of a     comprehensive MRSA colonization     surveillance program. It is not     intended to diagnose MRSA     infection nor to guide or     monitor treatment for     MRSA infections.  STOOL CULTURE     Status: None   Collection Time    07/06/13 10:00 PM      Result Value Range Status   Specimen Description STOOL   Final   Special Requests Normal   Final   Culture     Final   Value: NO SUSPICIOUS COLONIES, CONTINUING TO HOLD     Performed at [facility]   Report Status PENDING   Incomplete  CLOSTRIDIUM DIFFICILE BY PCR     Status: None   Collection Time    07/06/13 10:00 PM      Result Value  Range Status   C difficile by pcr NEGATIVE  NEGATIVE Final     Studies: Dg Abd Portable 1v  07/07/2013   CLINICAL DATA:  Possible ileus.  EXAM: PORTABLE ABDOMEN - 1 VIEW  COMPARISON:  CT ABD/PELVIS W CM dated 07/05/2013  FINDINGS: Single supine view of the abdomen and pelvis. Contrast within the colon. Paucity of small bowel gas. No evidence of bowel obstruction or ileus. No definite abnormal abdominal calcifications. No pneumatosis or free intraperitoneal air.  IMPRESSION: No evidence of ileus.   Electronically Signed   By: Jeronimo GreavesKyle  Talbot M.D.   On: 07/07/2013 19:38    Scheduled Meds: . [facility][MAR HOLD] carvedilol  6.25 mg Oral BID WC  . [facility][MAR HOLD] digoxin  0.125 mg Oral Daily  . [facility][MAR HOLD] guaiFENesin  600 mg Oral BID  . heparin  5,000 Units Subcutaneous Q8H  . [MAR HOLD] insulin aspart  0-9 Units Subcutaneous TID WC  . [MAR HOLD] insulin glargine  5 Units Subcutaneous QHS  . [MAR HOLD] lisinopril  10 mg Oral Daily  . [facility][MAR HOLD] pantoprazole  40 mg Oral Q0600  . [facility][MAR HOLD] sertraline  50 mg Oral Daily  . [facility][MAR HOLD] simvastatin  5 mg Oral QHS  . sodium chloride  3 mL Intravenous Q12H  . [facility][MAR HOLD] spironolactone  12.5 mg Oral Daily  . [facility][MAR HOLD] torsemide  60 mg Oral Daily   Continuous Infusions: . milrinone 0.25 mcg/kg/min (07/09/13 1216)     Radonna RickerFELIZ Rosine BeatTIZ, Quinterious Walraven  [facility] Pager [phone]. If 8PM-8AM, please contact night-coverage at [email or web address], password [email or web address] 07/09/2013, 1:09 PM  LOS: 4 days

## 2013-07-09 NOTE — Care Management Note (Addendum)
    Page 1 of 2   07/10/2013     4:25:16 PM   CARE MANAGEMENT NOTE 07/10/2013  Patient:  Gary Gallegos, Gary Gallegos   Account Number:  [id number]  Date Initiated:  07/06/2013  Documentation initiated by:  Gary Gallegos  Subjective/Objective Assessment:   Pt admitted with gastroenteritis/ Home wtith children     Action/Plan:   heart cath; Milranone gtts; PICC insertion; EGD/ Home with [facility]  07/10/13 disposition changed to Home with Hospice   Anticipated DC Date:  07/11/2013   Anticipated DC Plan:  HOME W HOSPICE CARE      DC Planning Services  CM consult      [facility]  HOME HEALTH  HOSPICE   Choice offered to / List presented to:  C-1 Patient   DME arranged  IV PUMP/EQUIPMENT      DME agency  [facility].     [facility] arranged  HH-1 RN  HH-10 DISEASE MANAGEMENT      HH agency  [facility].  [facility] [place]   Status of service:  In process, will continue to follow Medicare Important Message given?   (If response is "NO", the following Medicare IM given date fields will be blank) Date Medicare IM given:   Date Additional Medicare IM given:    Discharge Disposition:    Per UR Regulation:  Reviewed for med. necessity/level of care/duration of stay  If discussed at Long Length of Stay Meetings, dates discussed:    Comments:  07/10/13 1610 Gary Cohn, RN, BSN, [place] [phone] Spoke to pt at bedside regarding Home Hospice choice.  Pt referred me to his daughter, Gary Gallegos to make the decision. Faith chose [facility] to render services.  Vickie of [facility] notified and will contact Gary David, RN to assess pt for home DME needs.  Contact Info: Author Gary Gallegos [phone]  07/08/13 1100 Gary Butkus Lucretia Roers, RN, BSN, [place] [phone] Spoke with pt at bedside regarding discharge planning for Gary Gallegos. Offered pt list of home health agencies to choose from.  Pt chose [facility] to render  services of RN and IV therapy as he will be on Milranone gtts. Gary Furnish, RN and Gary Modena, RN of Gary Gallegos notified.  DME needs identified at this time include IV pump for milranone.

## 2013-07-10 ENCOUNTER — Encounter (HOSPITAL_COMMUNITY): Payer: Self-pay | Admitting: Internal Medicine

## 2013-07-10 DIAGNOSIS — R531 Weakness: Secondary | ICD-10-CM

## 2013-07-10 DIAGNOSIS — I10 Essential (primary) hypertension: Secondary | ICD-10-CM

## 2013-07-10 DIAGNOSIS — I5023 Acute on chronic systolic (congestive) heart failure: Secondary | ICD-10-CM

## 2013-07-10 DIAGNOSIS — Z515 Encounter for palliative care: Secondary | ICD-10-CM

## 2013-07-10 LAB — STOOL CULTURE: SPECIAL REQUESTS: NORMAL

## 2013-07-10 LAB — CARBOXYHEMOGLOBIN
CARBOXYHEMOGLOBIN: 1.6 % — AB (ref 0.5–1.5)
CARBOXYHEMOGLOBIN: 2.1 % — AB (ref 0.5–1.5)
Methemoglobin: 0.6 % (ref 0.0–1.5)
Methemoglobin: 1.3 % (ref 0.0–1.5)
O2 Saturation: 61.4 %
O2 Saturation: 84 %
Total hemoglobin: 17.1 g/dL (ref 13.5–18.0)
Total hemoglobin: 17.5 g/dL (ref 13.5–18.0)

## 2013-07-10 LAB — GLUCOSE, CAPILLARY
GLUCOSE-CAPILLARY: 161 mg/dL — AB (ref 70–99)
Glucose-Capillary: 148 mg/dL — ABNORMAL HIGH (ref 70–99)
Glucose-Capillary: 193 mg/dL — ABNORMAL HIGH (ref 70–99)
Glucose-Capillary: 190 mg/dL — ABNORMAL HIGH (ref 70–99)

## 2013-07-10 LAB — BASIC METABOLIC PANEL
BUN: 32 mg/dL — ABNORMAL HIGH (ref 6–23)
CHLORIDE: 90 meq/L — AB (ref 96–112)
CO2: 30 mEq/L (ref 19–32)
CREATININE: 0.83 mg/dL (ref 0.50–1.35)
Calcium: 8.6 mg/dL (ref 8.4–10.5)
Glucose, Bld: 164 mg/dL — ABNORMAL HIGH (ref 70–99)
Potassium: 3.6 mEq/L — ABNORMAL LOW (ref 3.7–5.3)
Sodium: 133 mEq/L — ABNORMAL LOW (ref 137–147)

## 2013-07-10 MED ORDER — PANTOPRAZOLE SODIUM 40 MG PO TBEC
40.0000 mg | DELAYED_RELEASE_TABLET | Freq: Every day | ORAL | Status: DC
  Administered 2013-07-11: 40 mg via ORAL
  Filled 2013-07-10: qty 1

## 2013-07-10 MED ORDER — CARVEDILOL 3.125 MG PO TABS
3.1250 mg | ORAL_TABLET | Freq: Two times a day (BID) | ORAL | Status: DC
  Filled 2013-07-10 (×3): qty 1

## 2013-07-10 NOTE — Progress Notes (Signed)
[facility] PROGRESS NOTE Interim History: 68 year old male patient with known diabetes and severe diabetic retinopathy - has cataracts and is legally blind also history of hypertension and severe systolic dysfunction with an EF of 10% and is status post ICD. He presented to the hospital because of weakness. 4 days prior to admission his Lasix was discontinued and he was switched to torsemide with potassium and subsequently developed anorexia and poor oral intake. Upon presentation to the ER he was found to have a fever of 102.1 and a cough. Chest x-ray was negative for infiltrate - a flu PCR was negative. According to the family the patient had coffee-ground emesis x 2.   Filed Weights   07/08/13 0643 07/09/13 0644 07/10/13 0512  Weight: 62.007 kg (136 lb 11.2 oz) 62.551 kg (137 lb 14.4 oz) 62.506 kg (137 lb 12.8 oz)        Intake/Output Summary (Last 24 hours) at 07/10/13 1046 Last data filed at 07/10/13 1610  Gross per 24 hour  Intake 1227.38 ml  Output   2125 ml  Net -897.62 ml     Assessment/Plan: Nausea vomiting and diarrhea  - C. Difficile neg - fecal lactoferrin positive which is c/w acute infectious process  - Continue supportive care   Coffee ground emesis  - Appreciate GI evaluation  - EGD on 1.22.2014 mild diffuse gastritis, started PPI. -  Pt was NOT on Plavix 5 pre admit  - no further N/V- X-Ray unremarkable   Acute on Chronic severe systolic heart failure - EF 10%  - Appreciate cardiologist assistance  - K low 1/19 so held dig  - Cards resuming Spironolactone, held coreg and increase milrinone. - Was new pt to OP [facility]F Clinic- only one visit pre admit- rec RHF - PICC line placed on 1.22.2014. -would not be a candidate for advanced therapies given diabetic retinopathy/near blindness.  Dehydration  - Home weight 170 pounds prior to onset of this illness  - Heart failure clinic visit 06/29/2013 demonstrated a weight of 148 pounds. - Held diuretics initially  but Cards resuming Spironolactone 1/21  - Hemoglobin is at baseline which is between 16 and 17   Respiratory failure with hypoxia  -Primarily appears to be related to hypoventilation and likely can wean oxygen   Cirrhosis / questionable liver mass / thrombocytopenia  -GI also assisting with this problem  - PMT meeting 122.2014.  Diabetes mellitus/poorly controlled  - Current CBGs controlled but HgbA1c high at 10.9  - Lantus prior to admission- resumed at lower dose 1/20- not consistently high and since variable intake would defer increase Lantus for now  - Continue low dose with SSI   HTN (hypertension)  -Carvedilol and Prinivil at lower dosage than pre-admission per Cards    Code Status: Full  Family Communication: No family at bedside  Disposition Plan/Expected LOS: tele bed    Consultants: Gastroenterology  Cardiology  Palliative Care    Procedures: ECHO: none  Antibiotics:  none  HPI/Subjective: No complains  Objective: Filed Vitals:   07/09/13 2029 07/10/13 0512 07/10/13 0818 07/10/13 0950  BP: 98/61 115/60 121/72 117/73  Pulse: 81 77 88 81  Temp: 98.1 F (36.7 C) 98.1 F (36.7 C)    TempSrc: Oral Oral    Resp: 22 18    Height:      Weight:  62.506 kg (137 lb 12.8 oz)    SpO2: 97% 98%  96%     Exam:  General: Alert, awake, oriented x3, in no acute distress.  HEENT: No bruits, no goiter. +JVD Heart: Regular rate and rhythm, without murmurs, rubs, gallops.  Lungs: Good air movement, bilateral air movement.  Abdomen: Soft, nontender, nondistended, positive bowel sounds.    Data Reviewed: Basic Metabolic Panel:  Recent Labs Lab 07/05/13 1609 07/06/13 0220 07/07/13 0320 07/08/13 0609 07/09/13 0331 07/10/13 0520  NA 142 138 136* 137 135* 133*  K 3.7 3.2* 3.6* 4.2 3.7 3.6*  CL 93* 91* 91* 94* 93* 90*  CO2 36* 32 32 27 29 30   GLUCOSE 230* 177* 169* 105* 213* 164*  BUN 20 19 22  29* 31* 32*  CREATININE 0.96 0.85 0.84 0.72 0.73 0.83   CALCIUM 9.7 9.2 8.8 8.8 8.6 8.6  MG  --  1.2*  --   --   --   --   PHOS  --  3.7  --   --   --   --    Liver Function Tests:  Recent Labs Lab 07/05/13 1609 07/06/13 0220  AST 26 26  ALT 12 10  ALKPHOS 201* 192*  BILITOT 2.4* 2.5*  PROT 7.7 7.4  ALBUMIN 3.5 3.4*   No results found for this basename: LIPASE, AMYLASE,  in the last 168 hours  Recent Labs Lab 07/07/13 0320  AMMONIA 27   CBC:  Recent Labs Lab 07/05/13 1609 07/06/13 0220 07/06/13 0840 07/06/13 2055 07/08/13 0609  WBC 12.9* 9.3 9.4 7.6 10.9*  NEUTROABS 11.3*  --   --   --   --   HGB 17.3* 17.3* 16.6 16.3 18.1*  HCT 50.0 48.6 46.1 46.1 51.3  MCV 91.9 90.0 90.6 90.7 91.3  PLT 131* 114* 118* 119* 129*   Cardiac Enzymes:  Recent Labs Lab 07/06/13 0220 07/06/13 0657 07/06/13 1334  TROPONINI <0.30 <0.30 <0.30   BNP (last 3 results)  Recent Labs  02/13/13 2346 07/05/13 1609  PROBNP 7676.0* 14925.0*   CBG:  Recent Labs Lab 07/09/13 0549 07/09/13 1031 07/09/13 1612 07/09/13 2116 07/10/13 0629  GLUCAP 217* 166* 263* 185* 148*    Recent Results (from the past 240 hour(s))  MRSA PCR SCREENING     Status: None   Collection Time    07/06/13  1:07 AM      Result Value Range Status   MRSA by PCR NEGATIVE  NEGATIVE Final   Comment:            The GeneXpert MRSA Assay (FDA     approved for NASAL specimens     only), is one component of a     comprehensive MRSA colonization     surveillance program. It is not     intended to diagnose MRSA     infection nor to guide or     monitor treatment for     MRSA infections.  STOOL CULTURE     Status: None   Collection Time    07/06/13 10:00 PM      Result Value Range Status   Specimen Description STOOL   Final   Special Requests Normal   Final   Culture     Final   Value: NO SALMONELLA, SHIGELLA, CAMPYLOBACTER, YERSINIA, OR E.COLI 0157:H7 ISOLATED     Performed at [facility]   Report Status 07/10/2013 FINAL   Final  CLOSTRIDIUM  DIFFICILE BY PCR     Status: None   Collection Time    07/06/13 10:00 PM      Result Value Range Status   C difficile by pcr NEGATIVE  NEGATIVE  Final     Studies: No results found.  Scheduled Meds: . digoxin  0.125 mg Oral Daily  . guaiFENesin  600 mg Oral BID  . heparin  5,000 Units Subcutaneous Q8H  . insulin aspart  0-9 Units Subcutaneous TID WC  . insulin glargine  5 Units Subcutaneous QHS  . lisinopril  10 mg Oral Daily  . pantoprazole  20 mg Oral Daily  . sertraline  50 mg Oral Daily  . simvastatin  5 mg Oral QHS  . spironolactone  12.5 mg Oral Daily  . torsemide  60 mg Oral Daily   Continuous Infusions: . milrinone 0.375 mcg/kg/min (07/10/13 0947)     Marinda Elk  [facility] Pager [phone]. If 8PM-8AM, please contact night-coverage at [email or web address], password [facility] 07/10/2013, 10:46 AM  LOS: 5 days

## 2013-07-10 NOTE — Progress Notes (Signed)
[facility]  Patient Status: New pt this admission for [facility]  [facility] is providing the following services:   [facility] originally planned for [facility].  Patient and daughter now electing Hospice services for nursing.  [facility] will still support home inotrope therapy upon DC to home.  [facility] will be on standby awaiting final DC plan.   [facility] has Milrinone orders and is prepared to provide drug if pt discharges home over the weekend with [facility].    If patient discharges after hours, please call [phone].   Sedalia Muta 07/10/2013, 5:44 PM

## 2013-07-10 NOTE — Progress Notes (Signed)
[facility]  Subjective: I spoke with [name] and his daughter Gary Gallegos about their goals of care. He confirmed DNR and I placed order on chart. [name] also defers to his daughter Gary Gallegos to make his decisions and they are in process of getting a HCPOA in place. [name] understands that he is seriously ill and his life is limited by his heart disease with an EF 10%. Dr. Shirlee Gallegos says that he is not likely a candidate for LVAD or transplant d/t underlying severe DM (blindness). He has recently lost a lot of weight and decreased appetite. We discussed MOST form, full comfort, feeding tubes, antibiotics. However, his daughter Gary Gallegos is overwhelmed with decisions and needs more time to process and to discuss with family as he is referring to Gary Gallegos to make his decisions. He does say that "when it's my time I'm ready." He hints more towards full comfort and staying at home but Gary Gallegos needs more time to process. They understand that the interventions listed on the MOST form may not be indicated in the future when his health further declines. We did discuss turning off his AICD (since he decided DNR) and he and Gary Gallegos are considering this. They do realize his time is limited and desire hospice to help them with this in the home (they understand that hospice means that we believe he has 6 months or less). They do plan to go home on Milrinone. Will continue to follow and support while in the hospital and I hope hospice can help them further clarify goals when home.    Objective: Allergies  Allergen Reactions  . Penicillins Nausea And Vomiting  . Sulfa Antibiotics Nausea And Vomiting   Scheduled Meds: . digoxin  0.125 mg Oral Daily  . guaiFENesin  600 mg Oral BID  . heparin  5,000 Units Subcutaneous Q8H  . insulin aspart  0-9 Units Subcutaneous TID WC  . insulin glargine  5 Units Subcutaneous QHS  . lisinopril  10 mg Oral Daily  . [START ON 07/11/2013]  pantoprazole  40 mg Oral Daily  . sertraline  50 mg Oral Daily  . simvastatin  5 mg Oral QHS  . spironolactone  12.5 mg Oral Daily  . torsemide  60 mg Oral Daily   Continuous Infusions: . milrinone 0.375 mcg/kg/min (07/10/13 0947)   PRN Meds:.acetaminophen, guaiFENesin-dextromethorphan, HYDROcodone-acetaminophen, ondansetron (ZOFRAN) IV, ondansetron, simethicone, sodium chloride, zolpidem  BP 114/64  Pulse 91  Temp(Src) 98.8 F (37.1 C) (Oral)  Resp 20  Ht 5\' 9"  (1.753 m)  Wt 62.506 kg (137 lb 12.8 oz)  BMI 20.34 kg/m2  SpO2 98%   PPS: 40%   Intake/Output Summary (Last 24 hours) at 07/10/13 1451 Last data filed at 07/10/13 1402  Gross per 24 hour  Intake 1067.38 ml  Output   2525 ml  Net -1457.62 ml      LBM: 07/07/13     Physical Exam:  General: NAD, cachectic, ill appearing HEENT:  [place]/AT, +JVD, temporal muscle wasting Chest:  CTA throughout, no labored breathing CVS: RRR, murmur Abdomen: Soft, NT, ND, +BS Ext: No edema, MAE Neuro: Alert & oriented x 3  Labs: CBC    Component Value Date/Time   WBC 10.9* 07/08/2013 0609   RBC 5.62 07/08/2013 0609   HGB 18.1* 07/08/2013 0609   HCT 51.3 07/08/2013 0609   PLT 129* 07/08/2013 0609   MCV 91.3 07/08/2013 0609   MCH 32.2 07/08/2013 0609   MCHC  35.3 07/08/2013 0609   RDW 14.5 07/08/2013 0609   LYMPHSABS 0.4* 07/05/2013 1609   MONOABS 1.1* 07/05/2013 1609   EOSABS 0.0 07/05/2013 1609   BASOSABS 0.0 07/05/2013 1609    BMET    Component Value Date/Time   NA 133* 07/10/2013 0520   K 3.6* 07/10/2013 0520   CL 90* 07/10/2013 0520   CO2 30 07/10/2013 0520   GLUCOSE 164* 07/10/2013 0520   BUN 32* 07/10/2013 0520   CREATININE 0.83 07/10/2013 0520   CALCIUM 8.6 07/10/2013 0520   GFRNONAA >90 07/10/2013 0520   GFRAA >90 07/10/2013 0520    CMP     Component Value Date/Time   NA 133* 07/10/2013 0520   K 3.6* 07/10/2013 0520   CL 90* 07/10/2013 0520   CO2 30 07/10/2013 0520   GLUCOSE 164* 07/10/2013 0520   BUN 32* 07/10/2013 0520    CREATININE 0.83 07/10/2013 0520   CALCIUM 8.6 07/10/2013 0520   PROT 7.4 07/06/2013 0220   ALBUMIN 3.4* 07/06/2013 0220   AST 26 07/06/2013 0220   ALT 10 07/06/2013 0220   ALKPHOS 192* 07/06/2013 0220   BILITOT 2.5* 07/06/2013 0220   GFRNONAA >90 07/10/2013 0520   GFRAA >90 07/10/2013 0520     Assessment and Plan: 1. Code Status: DNR 2. Symptom Control: 1. Weakness: Continue medical management. PT following.  3. Psycho/Social: Emotional support provided to patient and daughter during difficult conversation. 4. Spiritual: Consulted. 5. Disposition: Hopeful for home with hospice.   Patient Documents Completed or Given: Document Given Completed  Advanced Directives Pkt    MOST yes   DNR    Gone from My Sight    Hard Choices yes     Time In Time Out Total Time Spent with Patient Total Overall Time  1415 1500     Greater than 50%  of this time was spent counseling and coordinating care related to the above assessment and plan.  Yong Channel, NP Palliative Medicine Team Team Phone # [phone]   1

## 2013-07-10 NOTE — Consult Note (Signed)
I have reviewed this case with our NP and agree with the Assessment and Plan as stated.  Valdemar Mcclenahan L. Mackie Goon, MD MBA The Palliative Medicine Team at [place] Team Phone: [phone] Pager: [phone]   

## 2013-07-10 NOTE — Progress Notes (Addendum)
Advanced Heart Failure Rounding Note  Primary Physician: Janell QuietAMPBELL, PADONDA BOYD, FNP  Primary Cardiologist: Arvilla Meresaniel Bensimhon  Primary Electrophysiologist: None  Subjective:   [name] is a 68 yo male referred to the HF clinic by Dr.Nelson. He has a history of DM2 with severe diabetic retinopathy, cataracts HTN, HLD, chronic systolic HF due to severe, EF 16%10% (11/14) s/p ICD Medtronic.   Started having problems with his heart in early 2013. Was [facility] by [facility] at [facility]. Had cath in 2013 and arteries were "fine"  ECHO 05/05/13: EF 10%, diff HK, grade II DD, mild MR, LA mod dilated, RV mildly dilated and sys fx mod reduced, mod TR   Admitted for N/V/D, fever, myalgias and coffee ground emesis (1/18) GI consulted and plan is for EGD 1/23 after off Plavix for 5 days. C-diff negative. Flu and noravirus negative. Taken for RHC yesterday:  RA mean 9  RV 49/7  PA 44/16, mean 26  PCWP mean 14  Oxygen saturations:  PA 68%  AO 97%  Cardiac Output (Fick) 3.28  Cardiac Index (Fick) 1.86  PVR 3.3 WU  Cardiac Output (Thermo) 2.72  Cardiac Index (Thermo) 1.55  PVR 4.4 WU  Started on milrinone 0.25 and restarted home diuretics. Palliative consult yesterday. PICC placed and co-ox 61%. Weight unchanged. EGD showed severe esophagitis and mild gastritis. Denies SOB, orthopnea or CP.   Objective:   Weight Range:  Vital Signs:   Temp:  [98 F (36.7 C)-98.3 F (36.8 C)] 98.1 F (36.7 C) (01/23 0512) Pulse Rate:  [77-88] 88 (01/23 0818) Resp:  [5-26] 18 (01/23 0512) BP: (95-121)/(54-72) 121/72 mmHg (01/23 0818) SpO2:  [96 %-100 %] 98 % (01/23 0512) Weight:  [137 lb 12.8 oz (62.506 kg)] 137 lb 12.8 oz (62.506 kg) (01/23 0512) Last BM Date: 07/07/13  Weight change: Filed Weights   07/08/13 0643 07/09/13 0644 07/10/13 0512  Weight: 136 lb 11.2 oz (62.007 kg) 137 lb 14.4 oz (62.551 kg) 137 lb 12.8 oz (62.506 kg)    Intake/Output:   Intake/Output Summary (Last 24  hours) at 07/10/13 0854 Last data filed at 07/10/13 0646  Gross per 24 hour  Intake 987.38 ml  Output   1850 ml  Net -862.62 ml     Physical Exam: General: Chronically ill appearing. No resp difficulty, lying in bed HEENT: normal; arcus senilus. Lenses don't appear clouded  Neck: supple. JVP flat with prominent CV wave. Carotids 2+ bilaterally; no bruits. No lymphadenopathy or thryomegaly appreciated.  Cor: PMI laterally displaced. Regular rate & rhythm. No rubs or gallops; +3/6 systolic murmur  Lungs: clear  Abdomen: soft, nontender, mildly distended. No hepatosplenomegaly. No bruits or masses. Hypoactive bowel sounds.  Extremities: no cyanosis, clubbing, rash, edema; cool extremities  Neuro: alert & orientedx3, cranial nerves grossly intact. Moves all 4 extremities w/o difficulty. Affect pleasant.   Labs: Basic Metabolic Panel:  Recent Labs Lab 07/05/13 1609 07/06/13 0220 07/07/13 0320 07/08/13 0609 07/09/13 0331 07/10/13 0520  NA 142 138 136* 137 135* 133*  K 3.7 3.2* 3.6* 4.2 3.7 3.6*  CL 93* 91* 91* 94* 93* 90*  CO2 36* 32 32 27 29 30   GLUCOSE 230* 177* 169* 105* 213* 164*  BUN 20 19 22  29* 31* 32*  CREATININE 0.96 0.85 0.84 0.72 0.73 0.83  CALCIUM 9.7 9.2 8.8 8.8 8.6 8.6  MG  --  1.2*  --   --   --   --   PHOS  --  3.7  --   --   --   --  Liver Function Tests:  Recent Labs Lab 07/05/13 1609 07/06/13 0220  AST 26 26  ALT 12 10  ALKPHOS 201* 192*  BILITOT 2.4* 2.5*  PROT 7.7 7.4  ALBUMIN 3.5 3.4*   No results found for this basename: LIPASE, AMYLASE,  in the last 168 hours  Recent Labs Lab 07/07/13 0320  AMMONIA 27    CBC:  Recent Labs Lab 07/05/13 1609 07/06/13 0220 07/06/13 0840 07/06/13 2055 07/08/13 0609  WBC 12.9* 9.3 9.4 7.6 10.9*  NEUTROABS 11.3*  --   --   --   --   HGB 17.3* 17.3* 16.6 16.3 18.1*  HCT 50.0 48.6 46.1 46.1 51.3  MCV 91.9 90.0 90.6 90.7 91.3  PLT 131* 114* 118* 119* 129*    Cardiac Enzymes:  Recent  Labs Lab 07/06/13 0220 07/06/13 0657 07/06/13 1334  TROPONINI <0.30 <0.30 <0.30    BNP: BNP (last 3 results)  Recent Labs  02/13/13 2346 07/05/13 1609  PROBNP 7676.0* 14925.0*      Imaging: No results found.   Medications:     Scheduled Medications: . carvedilol  6.25 mg Oral BID WC  . digoxin  0.125 mg Oral Daily  . guaiFENesin  600 mg Oral BID  . heparin  5,000 Units Subcutaneous Q8H  . insulin aspart  0-9 Units Subcutaneous TID WC  . insulin glargine  5 Units Subcutaneous QHS  . lisinopril  10 mg Oral Daily  . pantoprazole  20 mg Oral Daily  . sertraline  50 mg Oral Daily  . simvastatin  5 mg Oral QHS  . spironolactone  12.5 mg Oral Daily  . torsemide  60 mg Oral Daily    Infusions: . milrinone 0.25 mcg/kg/min (07/09/13 1216)    PRN Medications: acetaminophen, guaiFENesin-dextromethorphan, HYDROcodone-acetaminophen, ondansetron (ZOFRAN) IV, ondansetron, simethicone, sodium chloride, zolpidem   Assessment:   1) N/V/D 2) Severe biventricular HF, EF 10% 3) Cardiogenic shock 3) NICM 4) Diabetic retinopathy 5) Nodular liver on imaging, likely cirrhosis. ?ETOH-related.  6) Severe esophagitis   Plan/Discussion:    Went for RHC yesterday showing low-output and was started on milrinone.  PICC placed yesterday and co-ox improved to 61%. Volume status stable on current diuretic regimen and Cr stable. Palliative consult yesterday. Stable from HF standpoint to discharge home with inotropes.   Very difficult situation. Gary Gallegos is suffering from biventricular HF with low output along with severe diabetic retinopathy almost blind. He is likely not a transplant candidate with renal function and severe DM and unfortunately is not a LVAD candidate d/t biventricular HF.   Will cut BB to 3.125 mg BID with cardiogenic shock. Will not titrate ACE-I with low SBP.   Length of Stay: 5  Aundria Rud NP-C 07/10/2013, 8:54 AM  Advanced Heart Failure Team Pager  [phone] (M-F; 7a - 4p)  Please contact  Cardiology for night-coverage after hours (4p -7a ) and weekends on [email or web address]  Patient seen with NP, agree with the above note.  He feels better on milrinone, less nausea.  Co-ox still only 61%, CI does not increase enough to qualify for home milrinone.  I will stop Coreg and will increase milrinone to 0.375 mcg/kg/min.  Will recheck co-ox at that point to make sure he has an appropriate rise in CI.  He is stable and can likely go home this afternoon on the current cardiac med regimen if CI is up.   Marca Ancona 07/10/2013 9:08 AM

## 2013-07-10 NOTE — Progress Notes (Signed)
Patient alert and oriented x3-4 throughout shift, occasionally disoriented to time.  Daughter at bedside this afternoon.  Patient denies any questions or concerns at this time.  Plan (per case management) is for patient to discharge home with home health if leaving on weekend, or home with hospice if leaving on a week day.  Patient remains on milrinone drip; vital signs stable.  Soft call bell ordered for patient as he has trouble finding call button on remote.  Patient oriented to new call bell.  Will continue to monitor.

## 2013-07-10 NOTE — Progress Notes (Signed)
Chaplain arrived at pt's room at 10:30 as requested by family.  Purpose of visit was to deliver Advance Directive information.  Pt's family members were not present.  Chaplain left AD documents on tray beside pt's bed and indicated to pt that pt's wife could review documents and notify chaplain (through the nurse) if pt or pt's wife have questions about AD and/or if the pt would like to pursue completion and finalization of AD.     07/10/13 1000  Clinical Encounter Type  Visited With Patient  Visit Type Spiritual support (Follow up referral for Advance Directive information.)  Referral From Chaplain    Rulon Abide, chaplain pager [phone]

## 2013-07-10 NOTE — Progress Notes (Signed)
Writer notified at 4:25pm [facility] received referral for evaluation for home with hospice services; spoke with [facility] Durward Mallard to inform writer is currently tied up at [facility] with another patient;  Durward Mallard [facility] shared that discharge may possibly be tomorrow and that patient has been set up with [facility] for Milrinone if [facility] cannot assess patient for admission to services; at this time Mr Branan information is under review; Clinical research associate will follow up on Monday should pt remain in the hospital, if discharged [facility] will follow up.    [name], RN 07/10/2013, 4:59 PM [facility] RN Liaison [phone]

## 2013-07-10 NOTE — Progress Notes (Signed)
Still waiting on co-ox from this afternoon with increase in milrinone and discontinuation of BB. There was concern that patient would not qualify for home milrinone if CI did not improve, however now patient has accepted home Hospice. Discussed with [facility] who would be managing his milrinone and placed orders. Ok from HF standpoint to go home with home milrinone. HF appointment placed on chart for discharge.   Ulla Potash B NP-C 4:02 PM

## 2013-07-10 NOTE — Consult Note (Signed)
Patient ZO:XWRUEA Oak      DOB: 06-22-1945      VWU:[id number]     Consult Note from the Palliative Medicine Team at [facility]    Consult Requested by: Dr Radonna Ricker     PCP: Janell Quiet, FNP Reason for Consultation:Clarification of GOC and options     Phone Number:[phone]  Assessment of patients Current state:  68 year old male patient with known diabetes and severe diabetic retinopathy - has cataracts and is legally blind also history of hypertension and severe systolic dysfunction with an EF of 10% and is status post ICD. He presented to the hospital because of weakness. 4 days prior to admission his Lasix was discontinued and he was switched to torsemide with potassium and subsequently developed anorexia and poor oral intake. Upon presentation to the ER he was found to have a fever of 102.1 and a cough. Chest x-ray was negative for infiltrate - a flu PCR was negative. Overall long term prognosis is limited.  Patient and family now faced with AD decisions and anticipatory care needs.    This NP Lorinda Creed reviewed medical records, received report from team, assessed the patient and then meet at the patient's bedside (waiting in ENDO) along with his daughtter Rod Can and her husband to discuss diagnosis prognosis, GOC, EOL wishes disposition and options.   A detailed discussion was had today regarding advanced directives.  Concepts specific to code status, artifical feeding and hydration, continued IV antibiotics and rehospitalization was had.  The difference between a aggressive medical intervention path  and a palliative comfort care path for this patient at this time was had.  Values and goals of care important to patient and family were attempted to be elicited.  Possibility of home Milrinone discussed and it risks and benefits.  Concept of Hospice and Palliative Care were discussed    Questions and concerns addressed.  Hard Choices booklet left for review.  Family encouraged to call with questions or concerns.  PMT will continue to support holistically.    Goals of Care: 1.  Code Status:  Full  -readdress in morning   2. Scope of Treatment: 1.  At this time patient is acceptable to all available and offered medical interventions to prolong life.  He will continue to make adjustments to AD dependant on medical outcomes.   4. Disposition:  Home with daughter when medicaly stable, decision pending home health vs hospice   3. Symptom Management:   1.  Weakness: Continue medical management of chronic disease.                           PT/OT as tolerrated  4. Psychosocial:  Emotional support to all at bedside.  This is especially difficult for his daughter who recently lost her mother to heart failure.  5. Spiritual:  Chaplain consulted    Patient Documents Completed or Given: Document Given Completed  Advanced Directives Pkt    MOST yes   DNR    Gone from My Sight    Hard Choices yes     Brief HPI::68 year old male patient with known diabetes and severe diabetic retinopathy - has cataracts and is legally blind also history of hypertension and severe systolic dysfunction with an EF of 10% and is status post ICD. He presented to the hospital because of weakness. 4 days prior to admission his Lasix was discontinued and he was switched to torsemide with potassium and subsequently developed anorexia and  poor oral intake. Upon presentation to the ER he was found to have a fever of 102.1 and a cough. Chest x-ray was negative for infiltrate - a flu PCR was negative. According to the family the patient had coffee-ground emesis x 2.   New diagnosed cirrhosis, questionable liver mass.   Milrinone to be initiated.  ROS:   weakness, fatigue    PMH:  Past Medical History  Diagnosis Date  . CHF (congestive heart failure)   . Diabetes mellitus without complication   . Hypertension      PSH: Past Surgical History  Procedure Laterality  Date  . Cardiac defibrillator placement     I have reviewed the FH and SH and  If appropriate update it with new information. Allergies  Allergen Reactions  . Penicillins Nausea And Vomiting  . Sulfa Antibiotics Nausea And Vomiting   Scheduled Meds: . carvedilol  6.25 mg Oral BID WC  . digoxin  0.125 mg Oral Daily  . guaiFENesin  600 mg Oral BID  . heparin  5,000 Units Subcutaneous Q8H  . insulin aspart  0-9 Units Subcutaneous TID WC  . insulin glargine  5 Units Subcutaneous QHS  . lisinopril  10 mg Oral Daily  . pantoprazole  20 mg Oral Daily  . sertraline  50 mg Oral Daily  . simvastatin  5 mg Oral QHS  . spironolactone  12.5 mg Oral Daily  . torsemide  60 mg Oral Daily   Continuous Infusions: . milrinone 0.25 mcg/kg/min (07/09/13 1216)   PRN Meds:.acetaminophen, guaiFENesin-dextromethorphan, HYDROcodone-acetaminophen, ondansetron (ZOFRAN) IV, ondansetron, simethicone, sodium chloride, zolpidem    BP 115/60  Pulse 77  Temp(Src) 98.1 F (36.7 C) (Oral)  Resp 18  Ht 5\' 9"  (1.753 m)  Wt 62.506 kg (137 lb 12.8 oz)  BMI 20.34 kg/m2  SpO2 98%   PPS:  40%   Intake/Output Summary (Last 24 hours) at 07/10/13 0637 Last data filed at 07/10/13 [id number]  Gross per 24 hour  Intake    932 ml  Output   2200 ml  Net  -1268 ml    Physical Exam:  General: Chronically ill appearing NAD HEENT:  Mm, poor dentation, + temporal muscle wasting Ext: BLE trace edema Neuro: alert and oriented, moves all four extremities Skin: warm and excessively dry  Labs: CBC    Component Value Date/Time   WBC 10.9* 07/08/2013 0609   RBC 5.62 07/08/2013 0609   HGB 18.1* 07/08/2013 0609   HCT 51.3 07/08/2013 0609   PLT 129* 07/08/2013 0609   MCV 91.3 07/08/2013 0609   MCH 32.2 07/08/2013 0609   MCHC 35.3 07/08/2013 0609   RDW 14.5 07/08/2013 0609   LYMPHSABS 0.4* 07/05/2013 1609   MONOABS 1.1* 07/05/2013 1609   EOSABS 0.0 07/05/2013 1609   BASOSABS 0.0 07/05/2013 1609    BMET    Component Value  Date/Time   NA 133* 07/10/2013 0520   K 3.6* 07/10/2013 0520   CL 90* 07/10/2013 0520   CO2 30 07/10/2013 0520   GLUCOSE 164* 07/10/2013 0520   BUN 32* 07/10/2013 0520   CREATININE 0.83 07/10/2013 0520   CALCIUM 8.6 07/10/2013 0520   GFRNONAA >90 07/10/2013 0520   GFRAA >90 07/10/2013 0520    CMP     Component Value Date/Time   NA 133* 07/10/2013 0520   K 3.6* 07/10/2013 0520   CL 90* 07/10/2013 0520   CO2 30 07/10/2013 0520   GLUCOSE 164* 07/10/2013 0520   BUN 32* 07/10/2013 0520  CREATININE 0.83 07/10/2013 0520   CALCIUM 8.6 07/10/2013 0520   PROT 7.4 07/06/2013 0220   ALBUMIN 3.4* 07/06/2013 0220   AST 26 07/06/2013 0220   ALT 10 07/06/2013 0220   ALKPHOS 192* 07/06/2013 0220   BILITOT 2.5* 07/06/2013 0220   GFRNONAA >90 07/10/2013 0520   GFRAA >90 07/10/2013 0520      Time In Time Out Total Time Spent with Patient Total Overall Time  1300 1415 70 min 75 min    Greater than 50%  of this time was spent counseling and coordinating care related to the above assessment and plan.  Lorinda Creed NP  Palliative Medicine Team Team Phone # [phone] Pager [phone]

## 2013-07-11 DIAGNOSIS — I5022 Chronic systolic (congestive) heart failure: Secondary | ICD-10-CM

## 2013-07-11 DIAGNOSIS — E1165 Type 2 diabetes mellitus with hyperglycemia: Secondary | ICD-10-CM

## 2013-07-11 DIAGNOSIS — K5289 Other specified noninfective gastroenteritis and colitis: Secondary | ICD-10-CM

## 2013-07-11 DIAGNOSIS — IMO0001 Reserved for inherently not codable concepts without codable children: Secondary | ICD-10-CM

## 2013-07-11 DIAGNOSIS — I509 Heart failure, unspecified: Secondary | ICD-10-CM

## 2013-07-11 LAB — BASIC METABOLIC PANEL
BUN: 27 mg/dL — AB (ref 6–23)
CO2: 31 mEq/L (ref 19–32)
CREATININE: 0.69 mg/dL (ref 0.50–1.35)
Calcium: 9 mg/dL (ref 8.4–10.5)
Chloride: 90 mEq/L — ABNORMAL LOW (ref 96–112)
GFR calc Af Amer: >90 mL/min (ref >90)
GLUCOSE: 162 mg/dL — AB (ref 70–99)
Potassium: 3.3 mEq/L — ABNORMAL LOW (ref 3.7–5.3)
SODIUM: 137 meq/L (ref 137–147)

## 2013-07-11 LAB — CARBOXYHEMOGLOBIN
Carboxyhemoglobin: 1.7 % — ABNORMAL HIGH (ref 0.5–1.5)
Methemoglobin: 0.6 % (ref 0.0–1.5)
O2 Saturation: 68.6 %
Total hemoglobin: 17.2 g/dL (ref 13.5–18.0)

## 2013-07-11 LAB — GLUCOSE, CAPILLARY
GLUCOSE-CAPILLARY: 156 mg/dL — AB (ref 70–99)
GLUCOSE-CAPILLARY: 254 mg/dL — AB (ref 70–99)
Glucose-Capillary: 231 mg/dL — ABNORMAL HIGH (ref 70–99)

## 2013-07-11 MED ORDER — MILRINONE IN DEXTROSE 20 MG/100ML IV SOLN: 0.3750 ug/kg/min | INTRAVENOUS | Status: DC

## 2013-07-11 MED ORDER — SPIRONOLACTONE 12.5 MG HALF TABLET: 12.5000 mg | ORAL_TABLET | Freq: Every day | ORAL | Status: DC

## 2013-07-11 MED ORDER — HEPARIN SOD (PORK) LOCK FLUSH 100 UNIT/ML IV SOLN
250.0000 [IU] | INTRAVENOUS | Status: AC | PRN
  Administered 2013-07-11: 250 [IU]

## 2013-07-11 MED ORDER — TORSEMIDE 20 MG PO TABS: 60.0000 mg | ORAL_TABLET | Freq: Every day | ORAL | Status: DC

## 2013-07-11 MED ORDER — LISINOPRIL 10 MG PO TABS: 10.0000 mg | ORAL_TABLET | Freq: Every day | ORAL | Status: DC

## 2013-07-11 MED ORDER — INSULIN GLARGINE 100 UNIT/ML ~~LOC~~ SOLN: 5.0000 [IU] | Freq: Every day | SUBCUTANEOUS | Status: DC

## 2013-07-11 NOTE — Progress Notes (Signed)
Pt. Alert and oriented this am. No s/s of distress or discomfort noted during the night. Pt. Resting in recliner. Call light within reach. Pt. Denies pain. RN will continue to monitor pt. For changes in condition. Marko Skalski, Cheryll Dessert

## 2013-07-11 NOTE — Discharge Summary (Signed)
Physician Discharge Summary  Gary Gallegos ZOX:[id number] DOB: 01/05/1946 DOA: 07/05/2013  PCP: Janell Quiet, FNP  Admit date: 07/05/2013 Discharge date: 07/11/2013  Time spent: 40 minutes  Recommendations for Outpatient Follow-up:  1. Follow up with cardiology in 1 week.  Filed Weights   07/09/13 0644 07/10/13 0512 07/11/13 0531  Weight: 62.551 kg (137 lb 14.4 oz) 62.506 kg (137 lb 12.8 oz) 60.6 kg (133 lb 9.6 oz)    Discharge Diagnoses:  Active Problems:   Diabetes mellitus   HTN (hypertension)   Chronic systolic heart failure   Nausea vomiting and diarrhea   Dehydration   Acute gastroenteritis   Coffee ground emesis   Cirrhosis   Liver mass, left lobe   Avoid fried   Palliative care encounter   Weakness generalized   Discharge Condition: guarded  Diet recommendation: heart healthy  Filed Weights   07/09/13 0644 07/10/13 0512 07/11/13 0531  Weight: 62.551 kg (137 lb 14.4 oz) 62.506 kg (137 lb 12.8 oz) 60.6 kg (133 lb 9.6 oz)    History of present illness:  68 y.o. male  has a past medical history of CHF (congestive heart failure); Diabetes mellitus without complication; and Hypertension. Presented with Patient was doing well 4 days ago his lasix was discontinued and he was switched to torsemide and added potassium. Next day he started to have poor PO intake, he started to have nausea, vomiting, and diarrhea. Patient became profoundly weak. He started to have cough and fever up to 102.1. Reports myalgias. CXR negative for infiltrate. Flu PCR was negative. Hospitalist called for admission.    Hospital Course:  Nausea vomiting and diarrhea  - C. Difficile neg  - Treated with supportive care. - Due to HF.  Coffee ground emesis  - Appreciate GI evaluation  - EGD on 1.22.2014 mild diffuse gastritis, started PPI.  - No further N/V- X-Ray unremarkable   Acute on Chronic severe systolic heart failure - EF 10%  - Appreciate cardiologist assistance  - K low  1/19 so held dig, now resume.  - Cards resuming Spironolactone, held coreg and increase milrinone.  - Was new pt to [facility]- only one visit pre admit- rec RHF. - PICC line placed on 1.22.2014.  - Would not be a candidate for advanced therapies given diabetic retinopathy/near blindness.  - Home with hospice.  Dehydration  - Heart failure clinic visit 06/29/2013 demonstrated a weight of 148 pounds.  - Held diuretics initially but Cards resuming Spironolactone 1/21, Cr and K remained stable. - Hemoglobin is at baseline which is between 16 and 17.  Respiratory failure with hypoxia  -Primarily appears to be related to hypoventilation. - resolved.  Cirrhosis / questionable liver mass / thrombocytopenia  - GI also assisting with this problem  - PMT meeting 122.2014. - Home with hospice.  Diabetes mellitus/poorly controlled  - Current CBGs controlled but HgbA1c high at 10.9  - Lantus prior to admission- resumed at lower dose 1/20- not consistently high and since variable intake would defer increase Lantus for now.  HTN (hypertension)  - D/c Carvedilol and cont Prinivil at lower dosage than pre-admission per Cards.   Procedures:  EGD 1.21.2014.  Ct Abd and chest  Abd x-ray  Consultations:  cardiology  Discharge Exam: Filed Vitals:   07/11/13 1021  BP: 108/74  Pulse: 93  Temp:   Resp:     General: A&O x3 Cardiovascular: RRR Respiratory: good air movement CTA B/L  Discharge Instructions  Discharge Orders   Future  Appointments Provider Department Dept Phone   07/22/2013 11:20 AM [facility]  [facility] [phone]   08/04/2013 2:45 PM Alvan Dame, DPM [facility] [phone]   Future Orders Complete By Expires   Diet - low sodium heart healthy  As directed    Increase activity slowly  As directed        Medication List    STOP taking these medications       metolazone 5 MG tablet   Commonly known as:  ZAROXOLYN     Potassium Chloride ER 20 MEQ Tbcr      TAKE these medications       aspirin EC 81 MG tablet  Take 81 mg by mouth daily.     carvedilol 6.25 MG tablet  Commonly known as:  COREG  Take 6.25 mg by mouth 2 (two) times daily with a meal.     digoxin 0.125 MG tablet  Commonly known as:  LANOXIN  Take 1 tablet (0.125 mg total) by mouth daily.     insulin glargine 100 UNIT/ML injection  Commonly known as:  LANTUS  Inject 0.05 mLs (5 Units total) into the skin at bedtime.     lisinopril 10 MG tablet  Commonly known as:  PRINIVIL,ZESTRIL  Take 1 tablet (10 mg total) by mouth daily.     milrinone 20 MG/100ML Soln infusion  Commonly known as:  PRIMACOR  Inject 23.25 mcg/min into the vein continuous.     ondansetron 4 MG tablet  Commonly known as:  ZOFRAN  Take 1 tablet (4 mg total) by mouth every 6 (six) hours as needed for nausea.     pantoprazole 20 MG tablet  Commonly known as:  PROTONIX  Take 1 tablet (20 mg total) by mouth daily.     sertraline 50 MG tablet  Commonly known as:  ZOLOFT  Take 1 tablet (50 mg total) by mouth daily.     simvastatin 5 MG tablet  Commonly known as:  ZOCOR  Take 5 mg by mouth at bedtime.     spironolactone 12.5 mg Tabs tablet  Commonly known as:  ALDACTONE  Take 0.5 tablets (12.5 mg total) by mouth daily.     torsemide 20 MG tablet  Commonly known as:  DEMADEX  Take 3 tablets (60 mg total) by mouth daily.     zolpidem 5 MG tablet  Commonly known as:  AMBIEN  Take 5 mg by mouth at bedtime as needed for sleep.       Allergies  Allergen Reactions  . Penicillins Nausea And Vomiting  . Sulfa Antibiotics Nausea And Vomiting       Follow-up Information   Follow up with Arvilla Meres, MD On 07/22/2013. (@ 11:20 am; [facility] code 0300)    Specialty:  Cardiology   Contact information:   [address] [place] [address] [phone]        The results of significant  diagnostics from this hospitalization (including imaging, microbiology, ancillary and laboratory) are listed below for reference.    Significant Diagnostic Studies: Ct Chest W Contrast  07/05/2013   CLINICAL DATA:  Nausea, vomiting, diarrhea, congestive heart failure, fever.  EXAM: CT CHEST, ABDOMEN, AND PELVIS WITH CONTRAST  TECHNIQUE: Multidetector CT imaging of the chest, abdomen and pelvis was performed following the standard protocol during bolus administration of intravenous contrast.  CONTRAST:  OMNIPAQUE IOHEXOL 300 MG/ML  SOLN  COMPARISON:  None.  FINDINGS:  CT CHEST FINDINGS  There is pacemaker in left chest wall. No axillary supraclavicular lymphadenopathy. No mediastinal hilar lymphadenopathy. There is a small pericardial effusion measuring 10 mm in depth adjacent to the right atrium. The thoracic aorta appears normal. There is no central pulmonary embolism. No mediastinal lymphadenopathy. Esophagus is normal.  Review of the lung parenchyma demonstrates no pulmonary edema or pleural fluid. Airways are normal.  CT ABDOMEN AND PELVIS FINDINGS  There is a moderate amount of intraperitoneal free fluid along the left and right pericolic gutter and into the pelvis. Additionally there is mild free fluid within the mesentery.  Small enhancing lesion in the left hepatic lobe measures 11 mm (image 56, series 3). The liver may have a fine nodular pattern. The caudate lobe is mildly enlarged. Portal veins are grossly patent. The gallbladder, pancreas, spleen, adrenal glands, and kidneys are normal. Kidneys enhance symmetrically.  The stomach, small bowel, and cecum are normal. Appendix is normal. Several diverticula of the descending colon with acute inflammation. No bowel obstruction.  Abdominal aorta is normal caliber. There is a left retro aortic renal vein. No retroperitoneal periportal lymphadenopathy. No mesenteric adenopathy. No organized fluid collections in the abdomen or pelvis. No intracranial  free air.  No abscess within the pelvis. Free-fluid collects within the posterior cul-de-sac. Bladder prostate gland are normal. No pelvic lymphadenopathy. Insert negative then  IMPRESSION: 1. Small pericardial effusion. 2. Cardiomegaly. 3. No acute pulmonary parenchymal abnormality. 4. Moderate volume intraperitoneal free fluid along the left and right pericolic gutter, within mesenteric, and collecting within the pelvis. This likely represents fluid from congestive heart failure or potentially hepatic dysfunction. 5. No abscess in the abdomen or pelvis. 6. No CT evidence of biliary obstruction. 7. Enlarged caudate lobe and fine nodular contour. Findings concerning for cirrhosis. Additionally there is a enhancing lesion in the left hepatic lobe. When patient can tolerate MRI (preferably as an outpatient when acute symptomology has improved and patient can follow-up breath hold commands), a contrast-enhanced MRI of the abdomen would be the most appropriate exam for evaluating the liver.   Electronically Signed   By: Genevive Bi M.D.   On: 07/05/2013 21:25   Ct Abdomen Pelvis W Contrast  07/05/2013   CLINICAL DATA:  Nausea, vomiting, diarrhea, congestive heart failure, fever.  EXAM: CT CHEST, ABDOMEN, AND PELVIS WITH CONTRAST  TECHNIQUE: Multidetector CT imaging of the chest, abdomen and pelvis was performed following the standard protocol during bolus administration of intravenous contrast.  CONTRAST:  OMNIPAQUE IOHEXOL 300 MG/ML  SOLN  COMPARISON:  None.  FINDINGS: CT CHEST FINDINGS  There is pacemaker in left chest wall. No axillary supraclavicular lymphadenopathy. No mediastinal hilar lymphadenopathy. There is a small pericardial effusion measuring 10 mm in depth adjacent to the right atrium. The thoracic aorta appears normal. There is no central pulmonary embolism. No mediastinal lymphadenopathy. Esophagus is normal.  Review of the lung parenchyma demonstrates no pulmonary edema or pleural fluid.  Airways are normal.  CT ABDOMEN AND PELVIS FINDINGS  There is a moderate amount of intraperitoneal free fluid along the left and right pericolic gutter and into the pelvis. Additionally there is mild free fluid within the mesentery.  Small enhancing lesion in the left hepatic lobe measures 11 mm (image 56, series 3). The liver may have a fine nodular pattern. The caudate lobe is mildly enlarged. Portal veins are grossly patent. The gallbladder, pancreas, spleen, adrenal glands, and kidneys are normal. Kidneys enhance symmetrically.  The stomach, small bowel, and cecum  are normal. Appendix is normal. Several diverticula of the descending colon with acute inflammation. No bowel obstruction.  Abdominal aorta is normal caliber. There is a left retro aortic renal vein. No retroperitoneal periportal lymphadenopathy. No mesenteric adenopathy. No organized fluid collections in the abdomen or pelvis. No intracranial free air.  No abscess within the pelvis. Free-fluid collects within the posterior cul-de-sac. Bladder prostate gland are normal. No pelvic lymphadenopathy. Insert negative then  IMPRESSION: 1. Small pericardial effusion. 2. Cardiomegaly. 3. No acute pulmonary parenchymal abnormality. 4. Moderate volume intraperitoneal free fluid along the left and right pericolic gutter, within mesenteric, and collecting within the pelvis. This likely represents fluid from congestive heart failure or potentially hepatic dysfunction. 5. No abscess in the abdomen or pelvis. 6. No CT evidence of biliary obstruction. 7. Enlarged caudate lobe and fine nodular contour. Findings concerning for cirrhosis. Additionally there is a enhancing lesion in the left hepatic lobe. When patient can tolerate MRI (preferably as an outpatient when acute symptomology has improved and patient can follow-up breath hold commands), a contrast-enhanced MRI of the abdomen would be the most appropriate exam for evaluating the liver.   Electronically Signed    By: Genevive BiStewart  Edmunds M.D.   On: 07/05/2013 21:25   Dg Chest Portable 1 View  07/05/2013   CLINICAL DATA:  CHF, weakness  EXAM: PORTABLE CHEST - 1 VIEW  COMPARISON:  DG CHEST 2 VIEW dated 02/16/2013  FINDINGS: Left-sided AICD. Enlarged cardiac silhouette. No effusion, infiltrate, or pneumothorax. No pulmonary edema. No pneumothorax.  IMPRESSION: Cardiomegaly without pulmonary edema.   Electronically Signed   By: Genevive BiStewart  Edmunds M.D.   On: 07/05/2013 16:33   Dg Abd Portable 1v  07/07/2013   CLINICAL DATA:  Possible ileus.  EXAM: PORTABLE ABDOMEN - 1 VIEW  COMPARISON:  CT ABD/PELVIS W CM dated 07/05/2013  FINDINGS: Single supine view of the abdomen and pelvis. Contrast within the colon. Paucity of small bowel gas. No evidence of bowel obstruction or ileus. No definite abnormal abdominal calcifications. No pneumatosis or free intraperitoneal air.  IMPRESSION: No evidence of ileus.   Electronically Signed   By: Jeronimo GreavesKyle  Talbot M.D.   On: 07/07/2013 19:38    Microbiology: Recent Results (from the past 240 hour(s))  MRSA PCR SCREENING     Status: None   Collection Time    07/06/13  1:07 AM      Result Value Range Status   MRSA by PCR NEGATIVE  NEGATIVE Final   Comment:            The GeneXpert MRSA Assay (FDA     approved for NASAL specimens     only), is one component of a     comprehensive MRSA colonization     surveillance program. It is not     intended to diagnose MRSA     infection nor to guide or     monitor treatment for     MRSA infections.  STOOL CULTURE     Status: None   Collection Time    07/06/13 10:00 PM      Result Value Range Status   Specimen Description STOOL   Final   Special Requests Normal   Final   Culture     Final   Value: NO SALMONELLA, SHIGELLA, CAMPYLOBACTER, YERSINIA, OR E.COLI 0157:H7 ISOLATED     Performed at [facility]   Report Status 07/10/2013 FINAL   Final  CLOSTRIDIUM DIFFICILE BY PCR     Status: None  Collection Time    07/06/13 10:00 PM       Result Value Range Status   C difficile by pcr NEGATIVE  NEGATIVE Final     Labs: Basic Metabolic Panel:  Recent Labs Lab 07/05/13 1609 07/06/13 0220 07/07/13 0320 07/08/13 0609 07/09/13 0331 07/10/13 0520 07/11/13 0540  NA 142 138 136* 137 135* 133* 137  K 3.7 3.2* 3.6* 4.2 3.7 3.6* 3.3*  CL 93* 91* 91* 94* 93* 90* 90*  CO2 36* 32 32 27 29 30 31   GLUCOSE 230* 177* 169* 105* 213* 164* 162*  BUN 20 19 22  29* 31* 32* 27*  CREATININE 0.96 0.85 0.84 0.72 0.73 0.83 0.69  CALCIUM 9.7 9.2 8.8 8.8 8.6 8.6 9.0  MG  --  1.2*  --   --   --   --   --   PHOS  --  3.7  --   --   --   --   --    Liver Function Tests:  Recent Labs Lab 07/05/13 1609 07/06/13 0220  AST 26 26  ALT 12 10  ALKPHOS 201* 192*  BILITOT 2.4* 2.5*  PROT 7.7 7.4  ALBUMIN 3.5 3.4*   No results found for this basename: LIPASE, AMYLASE,  in the last 168 hours  Recent Labs Lab 07/07/13 0320  AMMONIA 27   CBC:  Recent Labs Lab 07/05/13 1609 07/06/13 0220 07/06/13 0840 07/06/13 2055 07/08/13 0609  WBC 12.9* 9.3 9.4 7.6 10.9*  NEUTROABS 11.3*  --   --   --   --   HGB 17.3* 17.3* 16.6 16.3 18.1*  HCT 50.0 48.6 46.1 46.1 51.3  MCV 91.9 90.0 90.6 90.7 91.3  PLT 131* 114* 118* 119* 129*   Cardiac Enzymes:  Recent Labs Lab 07/06/13 0220 07/06/13 0657 07/06/13 1334  TROPONINI <0.30 <0.30 <0.30   BNP: BNP (last 3 results)  Recent Labs  02/13/13 2346 07/05/13 1609  PROBNP 7676.0* 14925.0*   CBG:  Recent Labs Lab 07/10/13 1125 07/10/13 1615 07/10/13 2108 07/11/13 0637 07/11/13 1122  GLUCAP 193* 190* 161* 156* 254*       Signed:  Marinda Elk  [facility] 07/11/2013, 12:45 PM

## 2013-07-11 NOTE — Progress Notes (Signed)
Patient discharged to home with daughter.  [facility] set up; milrinone drip with portable pump set up by [facility] nurse at bedside.  IV removed prior to discharge; IV site clean, dry, and intact.  Cardiology on call notified that carvedilol listed as being discontinued in notes but remains on discharge summary.  Discrepancy fixed by PA, carvedilol removed from discharge summary and home medications.  Discharge instructions discussed with patient and patient's daughter; both voiced understanding of discharge instructions and education.

## 2013-07-11 NOTE — Progress Notes (Signed)
[facility]   [facility] is providing the following services: Wheelchair and commode to be delivered to the patient's home. Patient refused hospital bed.   [name] 07/11/2013, 4:44 PM

## 2013-07-11 NOTE — Progress Notes (Signed)
Primary Physician: Janell Quiet, FNP  Primary Cardiologist: Arvilla Meres  Primary Electrophysiologist: None  Subjective:   [name] is a 68 yo male referred to the HF clinic by Dr.Nelson. He has a history of DM2 with severe diabetic retinopathy, cataracts HTN, HLD, chronic systolic HF due to severe, EF 00% (11/14) s/p ICD Medtronic.   Started having problems with his heart in early 2013. Was followed by cardiologist at [facility]. Had cath in 2013 and arteries were "fine"  ECHO 05/05/13: EF 10%, diff HK, grade II DD, mild MR, LA mod dilated, RV mildly dilated and sys fx mod reduced, mod TR   Admitted for N/V/D, fever, myalgias and coffee ground emesis (1/18) GI consulted and plan is for EGD 1/23 after off Plavix for 5 days. C-diff negative. Flu and noravirus negative.    RHC 07/08/13:  RA mean 9  RV 49/7  PA 44/16, mean 26  PCWP mean 14  Oxygen saturations:  PA 68%  AO 97%  Cardiac Output (Fick) 3.28  Cardiac Index (Fick) 1.86  PVR 3.3 WU  Cardiac Output (Thermo) 2.72  Cardiac Index (Thermo) 1.55  PVR 4.4 WU  Milrinone increased from 0.25 to 0.375 mcg/kg/min and restarted home diuretics. Palliative consult yesterday. Hospice.  PICC placed and co-ox 61% up to 68%. Weight down from 137 to 133.   EGD showed severe esophagitis and mild gastritis. Denies SOB, orthopnea or CP. Eating lunch.    Objective:   Weight Range:  Vital Signs:   Temp:  [97.5 F (36.4 C)-98.8 F (37.1 C)] 97.5 F (36.4 C) (01/24 0531) Pulse Rate:  [88-93] 93 (01/24 1021) Resp:  [20-22] 20 (01/24 0531) BP: (108-124)/(64-86) 108/74 mmHg (01/24 1021) SpO2:  [96 %-98 %] 97 % (01/24 1021) Weight:  [133 lb 9.6 oz (60.6 kg)] 133 lb 9.6 oz (60.6 kg) (01/24 0531) Last BM Date: 07/11/13  Weight change: Filed Weights   07/09/13 0644 07/10/13 0512 07/11/13 0531  Weight: 137 lb 14.4 oz (62.551 kg) 137 lb 12.8 oz (62.506 kg) 133 lb 9.6 oz (60.6 kg)     Intake/Output:   Intake/Output Summary (Last 24 hours) at 07/11/13 1220 Last data filed at 07/11/13 0856  Gross per 24 hour  Intake    720 ml  Output   1326 ml  Net   -606 ml     Physical Exam: General: Chronically ill appearing. No resp difficulty, lying in bed HEENT: normal; arcus senilus. Lenses don't appear clouded  Neck: supple. JVP flat with prominent CV wave. Carotids 2+ bilaterally; no bruits. No lymphadenopathy or thryomegaly appreciated.  Cor: PMI laterally displaced. Regular rate & rhythm. No rubs or gallops; +3/6 systolic murmur  Lungs: clear  Abdomen: soft, nontender, mildly distended. No hepatosplenomegaly. No bruits or masses. Hypoactive bowel sounds.  Extremities: no cyanosis, clubbing, rash, edema; cool extremities  Neuro: alert & orientedx3, cranial nerves grossly intact. Moves all 4 extremities w/o difficulty. Affect pleasant.   Labs: Basic Metabolic Panel:  Recent Labs Lab 07/05/13 1609 07/06/13 0220 07/07/13 0320 07/08/13 3704 07/09/13 0331 07/10/13 0520 07/11/13 0540  NA 142 138 136* 137 135* 133* 137  K 3.7 3.2* 3.6* 4.2 3.7 3.6* 3.3*  CL 93* 91* 91* 94* 93* 90* 90*  CO2 36* 32 32 27 29 30 31   GLUCOSE 230* 177* 169* 105* 213* 164* 162*  BUN 20 19 22  29* 31* 32* 27*  CREATININE 0.96 0.85 0.84 0.72 0.73 0.83 0.69  CALCIUM 9.7 9.2 8.8 8.8 8.6 8.6  9.0  MG  --  1.2*  --   --   --   --   --   PHOS  --  3.7  --   --   --   --   --     Liver Function Tests:  Recent Labs Lab 07/05/13 1609 07/06/13 0220  AST 26 26  ALT 12 10  ALKPHOS 201* 192*  BILITOT 2.4* 2.5*  PROT 7.7 7.4  ALBUMIN 3.5 3.4*    Recent Labs Lab 07/07/13 0320  AMMONIA 27    CBC:  Recent Labs Lab 07/05/13 1609 07/06/13 0220 07/06/13 0840 07/06/13 2055 07/08/13 0609  WBC 12.9* 9.3 9.4 7.6 10.9*  NEUTROABS 11.3*  --   --   --   --   HGB 17.3* 17.3* 16.6 16.3 18.1*  HCT 50.0 48.6 46.1 46.1 51.3  MCV 91.9 90.0 90.6 90.7 91.3  PLT 131* 114* 118* 119* 129*     Cardiac Enzymes:  Recent Labs Lab 07/06/13 0220 07/06/13 0657 07/06/13 1334  TROPONINI <0.30 <0.30 <0.30    BNP: BNP (last 3 results)  Recent Labs  02/13/13 2346 07/05/13 1609  PROBNP 7676.0* 14925.0*      Imaging: No results found.   Medications:     Scheduled Medications: . digoxin  0.125 mg Oral Daily  . guaiFENesin  600 mg Oral BID  . heparin  5,000 Units Subcutaneous Q8H  . insulin aspart  0-9 Units Subcutaneous TID WC  . insulin glargine  5 Units Subcutaneous QHS  . lisinopril  10 mg Oral Daily  . pantoprazole  40 mg Oral Daily  . sertraline  50 mg Oral Daily  . simvastatin  5 mg Oral QHS  . spironolactone  12.5 mg Oral Daily  . torsemide  60 mg Oral Daily    Infusions: . milrinone 0.375 mcg/kg/min (07/11/13 0219)    PRN Medications: acetaminophen, guaiFENesin-dextromethorphan, HYDROcodone-acetaminophen, ondansetron (ZOFRAN) IV, ondansetron, simethicone, sodium chloride, zolpidem   Assessment:   1) N/V/D 2) Severe biventricular HF, EF 10% 3) Cardiogenic shock 3) NICM 4) Diabetic retinopathy 5) Nodular liver on imaging, likely cirrhosis. ?ETOH-related.  6) Severe esophagitis   Plan/Discussion:    RHC showing low-output and was started on milrinone which has been up titrated to .375. PICC placed and co-ox improved to 61% then 68% with recent increase. Volume status stable on current diuretic regimen and Cr stable. Palliative consult - home hospice.  Stable from HF standpoint to discharge home with inotropes.   Very difficult situation. Gary Gallegos is suffering from biventricular HF with low output along with severe diabetic retinopathy almost blind. He is likely not a transplant candidate with renal function and severe DM and unfortunately is not a LVAD candidate d/t biventricular HF.   No longer on BB. Will not titrate ACE-I with low SBP.   OK for DC with Milrinone. Home hospice.   Length of Stay: 6    Gary Gallegos,  Gary Gallegos 07/11/2013 12:20 PM

## 2013-07-11 NOTE — Progress Notes (Signed)
[facility] PROGRESS NOTE Interim History: 68 year old male patient with known diabetes and severe diabetic retinopathy - has cataracts and is legally blind also history of hypertension and severe systolic dysfunction with an EF of 10% and is status post ICD. He presented to the hospital because of weakness. 4 days prior to admission his Lasix was discontinued and he was switched to torsemide with potassium and subsequently developed anorexia and poor oral intake. Upon presentation to the ER he was found to have a fever of 102.1 and a cough. Chest x-ray was negative for infiltrate - a flu PCR was negative. According to the family the patient had coffee-ground emesis x 2.   Filed Weights   07/09/13 0644 07/10/13 0512 07/11/13 0531  Weight: 62.551 kg (137 lb 14.4 oz) 62.506 kg (137 lb 12.8 oz) 60.6 kg (133 lb 9.6 oz)        Intake/Output Summary (Last 24 hours) at 07/11/13 0826 Last data filed at 07/11/13 0254  Gross per 24 hour  Intake 734.18 ml  Output   1600 ml  Net -865.82 ml     Assessment/Plan: Nausea vomiting and diarrhea  - C. Difficile neg, probably due to heart failure. - fecal lactoferrin positive. - Continue supportive care  - -no abdominal pain.  Coffee ground emesis  - Appreciate GI evaluation  - EGD on 1.22.2014 mild diffuse gastritis, started PPI. -  Pt was NOT on Plavix  pre admit  - no further N/V- X-Ray unremarkable   Acute on Chronic severe systolic heart failure - EF 10%  - Appreciate cardiologist assistance  - K low 1/19 so held dig  - Cards resuming Spironolactone, held coreg and increase milrinone. Also on digoxin. - Was new pt to [facility]- only one visit pre admit- rec RHF - PICC line placed on 1.22.2014. - Would not be a candidate for advanced therapies given diabetic retinopathy/near blindness. - home with hospice once medically stable from cardiology. Prognosis is poor.  Dehydration  - Home weight 170 pounds prior to onset of this  illness  - Heart failure clinic visit 06/29/2013 demonstrated a weight of 148 pounds. - Held diuretics initially but Cards resuming Spironolactone 1/21  - Hemoglobin is at baseline which is between 16 and 17   Respiratory failure with hypoxia  -Primarily appears to be related to hypoventilation and likely can wean oxygen   Cirrhosis / questionable liver mass / thrombocytopenia  -GI also assisting with this problem  - PMT meeting 122.2014.  Diabetes mellitus/poorly controlled  - Current CBGs controlled but HgbA1c high at 10.9  - Lantus prior to admission- resumed at lower dose 1/20- not consistently high and since variable intake would defer increase Lantus for now  - Continue low dose with SSI   HTN (hypertension)  - Prinivil at lower dosage than pre-admission per Cards    Code Status: Full  Family Communication: No family at bedside  Disposition Plan/Expected LOS: tele bed    Consultants: Gastroenterology  Cardiology  Palliative Care    Procedures: ECHO: none  Antibiotics:  none  HPI/Subjective: No complains  Objective: Filed Vitals:   07/10/13 0950 07/10/13 1436 07/10/13 2010 07/11/13 0531  BP: 117/73 114/64 111/71 124/86  Pulse: 81 91 91 88  Temp:  98.8 F (37.1 C) 98.3 F (36.8 C) 97.5 F (36.4 C)  TempSrc:  Oral Oral Oral  Resp:  20 22 20   Height:      Weight:    60.6 kg (133 lb 9.6 oz)  SpO2: 96% 98% 96% 96%     Exam:  General: Alert, awake, oriented x3, in no acute distress.  HEENT: No bruits, no goiter. +JVD Heart: Regular rate and rhythm, without murmurs, rubs, gallops.  Lungs: Good air movement, bilateral air movement.  Abdomen: Soft, nontender, nondistended, positive bowel sounds.    Data Reviewed: Basic Metabolic Panel:  Recent Labs Lab 07/05/13 1609 07/06/13 0220 07/07/13 0320 07/08/13 0609 07/09/13 0331 07/10/13 0520 07/11/13 0540  NA 142 138 136* 137 135* 133* 137  K 3.7 3.2* 3.6* 4.2 3.7 3.6* 3.3*  CL 93* 91* 91* 94*  93* 90* 90*  CO2 36* 32 32 27 29 30 31   GLUCOSE 230* 177* 169* 105* 213* 164* 162*  BUN 20 19 22  29* 31* 32* 27*  CREATININE 0.96 0.85 0.84 0.72 0.73 0.83 0.69  CALCIUM 9.7 9.2 8.8 8.8 8.6 8.6 9.0  MG  --  1.2*  --   --   --   --   --   PHOS  --  3.7  --   --   --   --   --    Liver Function Tests:  Recent Labs Lab 07/05/13 1609 07/06/13 0220  AST 26 26  ALT 12 10  ALKPHOS 201* 192*  BILITOT 2.4* 2.5*  PROT 7.7 7.4  ALBUMIN 3.5 3.4*   No results found for this basename: LIPASE, AMYLASE,  in the last 168 hours  Recent Labs Lab 07/07/13 0320  AMMONIA 27   CBC:  Recent Labs Lab 07/05/13 1609 07/06/13 0220 07/06/13 0840 07/06/13 2055 07/08/13 0609  WBC 12.9* 9.3 9.4 7.6 10.9*  NEUTROABS 11.3*  --   --   --   --   HGB 17.3* 17.3* 16.6 16.3 18.1*  HCT 50.0 48.6 46.1 46.1 51.3  MCV 91.9 90.0 90.6 90.7 91.3  PLT 131* 114* 118* 119* 129*   Cardiac Enzymes:  Recent Labs Lab 07/06/13 0220 07/06/13 0657 07/06/13 1334  TROPONINI <0.30 <0.30 <0.30   BNP (last 3 results)  Recent Labs  02/13/13 2346 07/05/13 1609  PROBNP 7676.0* 14925.0*   CBG:  Recent Labs Lab 07/10/13 0629 07/10/13 1125 07/10/13 1615 07/10/13 2108 07/11/13 0637  GLUCAP 148* 193* 190* 161* 156*    Recent Results (from the past 240 hour(s))  MRSA PCR SCREENING     Status: None   Collection Time    07/06/13  1:07 AM      Result Value Range Status   MRSA by PCR NEGATIVE  NEGATIVE Final   Comment:            The GeneXpert MRSA Assay (FDA     approved for NASAL specimens     only), is one component of a     comprehensive MRSA colonization     surveillance program. It is not     intended to diagnose MRSA     infection nor to guide or     monitor treatment for     MRSA infections.  STOOL CULTURE     Status: None   Collection Time    07/06/13 10:00 PM      Result Value Range Status   Specimen Description STOOL   Final   Special Requests Normal   Final   Culture     Final    Value: NO SALMONELLA, SHIGELLA, CAMPYLOBACTER, YERSINIA, OR E.COLI 0157:H7 ISOLATED     Performed at [facility]   Report Status 07/10/2013 FINAL   Final  CLOSTRIDIUM  DIFFICILE BY PCR     Status: None   Collection Time    07/06/13 10:00 PM      Result Value Range Status   C difficile by pcr NEGATIVE  NEGATIVE Final     Studies: No results found.  Scheduled Meds: . digoxin  0.125 mg Oral Daily  . guaiFENesin  600 mg Oral BID  . heparin  5,000 Units Subcutaneous Q8H  . insulin aspart  0-9 Units Subcutaneous TID WC  . insulin glargine  5 Units Subcutaneous QHS  . lisinopril  10 mg Oral Daily  . pantoprazole  40 mg Oral Daily  . sertraline  50 mg Oral Daily  . simvastatin  5 mg Oral QHS  . spironolactone  12.5 mg Oral Daily  . torsemide  60 mg Oral Daily   Continuous Infusions: . milrinone 0.375 mcg/kg/min (07/11/13 0219)     Marinda Elk  [facility] Pager [phone]. If 8PM-8AM, please contact night-coverage at [email or web address], password [facility] 07/11/2013, 8:26 AM  LOS: 6 days

## 2013-07-11 NOTE — Progress Notes (Signed)
   CARE MANAGEMENT NOTE 07/11/2013  Patient:  Gary Gallegos, Gary Gallegos   Account Number:  [id number]  Date Initiated:  07/06/2013  Documentation initiated by:  Donn Pierini  Subjective/Objective Assessment:   Pt admitted with gastroenteritis/ Home wtith children     Action/Plan:   heart cath; Milranone gtts; PICC insertion; EGD/ Home with [facility]  07/10/13 disposition changed to Home with Hospice   Anticipated DC Date:  07/11/2013   Anticipated DC Plan:  HOME W HOSPICE CARE      DC Planning Services  CM consult      [facility] Choice  HOME HEALTH  HOSPICE   Choice offered to / List presented to:  C-1 Patient   DME arranged  IV PUMP/EQUIPMENT      DME agency  [facility]     [facility] arranged  HH-1 RN  HH-10 DISEASE MANAGEMENT      HH agency  [facility]  [facility] [place]   Status of service:  Completed, signed off Medicare Important Message given?   (If response is "NO", the following Medicare IM given date fields will be blank) Date Medicare IM given:   Date Additional Medicare IM given:    Discharge Disposition:  HOME W HOME HEALTH SERVICES  Per UR Regulation:  Reviewed for med. necessity/level of care/duration of stay  If discussed at Long Length of Stay Meetings, dates discussed:    Comments:  07/11/13 15:20 Orders have been changed to pt going home with [facility] and [facility] will pursue Hospice care later this coming week.  CM spoke with daughter, [name], to see if [facility] spoke with her and [name] stated she did and understands her father will be going home with [facility] from [facility] this evening after the milrinone ggt. is initiated by [facility] RN.  [facility] pharmacy aware.  [facility] is ware of contact, Faith, and Faith's number for bed delivery.  No other CM needs were communicated.  Freddy Jaksch, BSN, [place] [phone].  07/10/13 1610 Camellia Lucretia Roers, RN, BSN, [employer] [phone] Spoke to pt at bedside regarding Home Hospice choice.  Pt referred me to his daughter, [name] to make  the decision. [facility] chose [facility] to render services.  Vickie of [facility] notified and will contact [name], RN to assess pt for home DME needs.  Contact Info: Kaidon Castellano [phone]  07/08/13 1100 Camellia Lucretia Roers, RN, BSN, [place] [phone] Spoke with pt at bedside regarding discharge planning for [facility]. Offered pt list of home health agencies to choose from.  Pt chose [facility] to render services of RN and IV therapy as he will be on Milranone gtts. Kizzie Furnish, RN and Jeri Modena, RN of [facility] notified.  DME needs identified at this time include IV pump for milranone.

## 2013-07-13 ENCOUNTER — Encounter: Payer: Self-pay | Admitting: Internal Medicine

## 2013-07-13 LAB — NOROVIRUS GROUP 1 & 2 BY PCR, STOOL

## 2013-07-19 ENCOUNTER — Encounter: Payer: Self-pay | Admitting: Family

## 2013-07-20 MED ORDER — GLUCOSE BLOOD VI STRP: ORAL_STRIP | Status: DC

## 2013-07-21 ENCOUNTER — Telehealth (HOSPITAL_COMMUNITY): Payer: Self-pay | Admitting: Adult Health

## 2013-07-21 MED ORDER — LISINOPRIL 10 MG PO TABS: 5.0000 mg | ORAL_TABLET | Freq: Every day | ORAL | Status: DC

## 2013-07-21 NOTE — Telephone Encounter (Signed)
Provided his daughter with lab results. K 5.7   Spironolactone and Potassium already stopped. Cut back lisinopril to 5 mg daily  Check BMET tomorrow.   He has follow up tomorrow in HF clinic.   [name] 4:59 PM

## 2013-07-22 ENCOUNTER — Ambulatory Visit (HOSPITAL_COMMUNITY)
Admission: RE | Admit: 2013-07-22 | Discharge: 2013-07-22 | Disposition: A | Discharge status: Home / Self Care | Payer: Medicare Other | Source: Ambulatory Visit | Attending: Internal Medicine | Admitting: Internal Medicine

## 2013-07-22 VITALS — HR 125 | Wt 130.5 lb

## 2013-07-22 DIAGNOSIS — Z95810 Presence of automatic (implantable) cardiac defibrillator: Secondary | ICD-10-CM | POA: Insufficient documentation

## 2013-07-22 DIAGNOSIS — I5022 Chronic systolic (congestive) heart failure: Secondary | ICD-10-CM

## 2013-07-22 DIAGNOSIS — Z79899 Other long term (current) drug therapy: Secondary | ICD-10-CM | POA: Insufficient documentation

## 2013-07-22 DIAGNOSIS — I5042 Chronic combined systolic (congestive) and diastolic (congestive) heart failure: Secondary | ICD-10-CM | POA: Insufficient documentation

## 2013-07-22 DIAGNOSIS — Z7982 Long term (current) use of aspirin: Secondary | ICD-10-CM | POA: Insufficient documentation

## 2013-07-22 DIAGNOSIS — E1139 Type 2 diabetes mellitus with other diabetic ophthalmic complication: Secondary | ICD-10-CM | POA: Insufficient documentation

## 2013-07-22 DIAGNOSIS — E785 Hyperlipidemia, unspecified: Secondary | ICD-10-CM | POA: Insufficient documentation

## 2013-07-22 DIAGNOSIS — H548 Legal blindness, as defined in USA: Secondary | ICD-10-CM | POA: Insufficient documentation

## 2013-07-22 DIAGNOSIS — E11319 Type 2 diabetes mellitus with unspecified diabetic retinopathy without macular edema: Secondary | ICD-10-CM | POA: Insufficient documentation

## 2013-07-22 DIAGNOSIS — Z794 Long term (current) use of insulin: Secondary | ICD-10-CM | POA: Insufficient documentation

## 2013-07-22 DIAGNOSIS — I10 Essential (primary) hypertension: Secondary | ICD-10-CM | POA: Insufficient documentation

## 2013-07-22 LAB — BASIC METABOLIC PANEL
BUN: 44 mg/dL — AB (ref 6–23)
CALCIUM: 9.7 mg/dL (ref 8.4–10.5)
CO2: 29 meq/L (ref 19–32)
Chloride: 91 mEq/L — ABNORMAL LOW (ref 96–112)
Creatinine, Ser: 0.81 mg/dL (ref 0.50–1.35)
GFR calc Af Amer: >90 mL/min (ref >90)
GFR calc non Af Amer: >90 mL/min (ref >90)
GLUCOSE: 350 mg/dL — AB (ref 70–99)
Potassium: 3.6 mEq/L — ABNORMAL LOW (ref 3.7–5.3)
SODIUM: 135 meq/L — AB (ref 137–147)

## 2013-07-22 LAB — DIGOXIN LEVEL: Digoxin Level: 1.9 ng/mL (ref 0.8–2.0)

## 2013-07-22 NOTE — Patient Instructions (Signed)
Hold Torsemide for 2 days   Follow up in 2-3 weeks  Do the following things EVERYDAY: 1) Weigh yourself in the morning before breakfast. Write it down and keep it in a log. 2) Take your medicines as prescribed 3) Eat low salt foods-Limit salt (sodium) to 2000 mg per day.  4) Stay as active as you can everyday 5) Limit all fluids for the day to less than 2 liters

## 2013-07-22 NOTE — Progress Notes (Signed)
Patient ID: Gary Gallegos, male   DOB: 1945-11-27, 68 y.o.   MRN: [id number] PCP: Dr. Adline MangoPadonda Campbell, FNP Primary Cardiologist: Dr. Andreas OhmKatrina Nelson  HPI: Gary Gallegos is a 68 yo male referred to the HF clinic by Dr.Nelson. He has a history of DM2 with severe diabetic retinopathy legally blind September , cataracts HTN, HLD, chronic systolic HF due to severe, EF 95%10% (11/14) s/p ICD Medtronic.   Started having problems with his heart in early 2013. [facility]. Had cath in 2013 and arteries were "fine".  ECHO 05/05/13: EF 10%, diff HK, grade II DD, mild MR, LA mod dilated, RV mildly dilated and sys fx mod reduced, mod TR  [facility] with N/V/D. EGD  Showed severe gastritis and esophagitis. Started on Milrinone 0.25 mcg via PICC. Discharge weight 133 pounds. [facility]   He returns for post hospital follow up with Faith and son in law. Denies SOB/PND/Orthopnea. Weight at home 126-129 pounds. [facility] following for home Milrinone at 0.25 mcg and weekly labs. Having urinary incontinence and they have been using condom catheter. He requires assistance with all ADLs. Appetite better. His daughter Rushie GoltzFaith prepares all his medications and meals. Plans for Hospice evaluation today.   Labs 07/15/13 K 5.7 Creatinine 1.95 instructed to cut lisinopril 5 mg daily.   SH: Lives his daughter,  son in law, and 2 granddaughters.     ROS: All systems negative except as listed in HPI, PMH and Problem List.  Past Medical History  Diagnosis Date  . CHF (congestive heart failure)   . Diabetes mellitus without complication   . Hypertension     Current Outpatient Prescriptions  Medication Sig Dispense Refill  . aspirin EC 81 MG tablet Take 81 mg by mouth daily.      . digoxin (LANOXIN) 0.125 MG tablet Take 1 tablet (0.125 mg total) by mouth daily.  30 tablet  3  . glucose blood test strip Use as instructed  100 each  12  . insulin glargine (LANTUS) 100  UNIT/ML injection Inject 0.05 mLs (5 Units total) into the skin at bedtime.  10 mL  11  . lisinopril (PRINIVIL,ZESTRIL) 10 MG tablet Take 0.5 tablets (5 mg total) by mouth daily.  30 tablet  6  . milrinone (PRIMACOR) 20 MG/100ML SOLN infusion Inject 23.25 mcg/min into the vein continuous.  100 mL    . ondansetron (ZOFRAN) 4 MG tablet Take 1 tablet (4 mg total) by mouth every 6 (six) hours as needed for nausea.  20 tablet  0  . pantoprazole (PROTONIX) 20 MG tablet Take 1 tablet (20 mg total) by mouth daily.  30 tablet  5  . sertraline (ZOLOFT) 50 MG tablet Take 1 tablet (50 mg total) by mouth daily.  30 tablet  3  . simvastatin (ZOCOR) 5 MG tablet Take 5 mg by mouth at bedtime.      . torsemide (DEMADEX) 20 MG tablet Take 3 tablets (60 mg total) by mouth daily.  30 tablet  0  . zolpidem (AMBIEN) 5 MG tablet Take 5 mg by mouth at bedtime as needed for sleep.       No current facility-administered medications for this encounter.    Filed Vitals:   07/22/13 1138  Pulse: 125  Weight: 130 lb 8 oz (59.194 kg)  SpO2: 99%    PHYSICAL EXAM: General: Chronically ill appearing. No resp difficulty, sitting in WC, daughter present HEENT: normal; arcus senilus. Lenses  don't appear clouded Neck: supple. JVP flat. Carotids 2+ bilaterally; no bruits. No lymphadenopathy or thryomegaly appreciated. Cor: PMI laterally displaced.Tachycardic regulaNo rubs. + S3; +3/6 systolic murmur Lungs: clear Abdomen: soft, nontender, + distended. No hepatosplenomegaly. No bruits or masses. Good bowel sounds. Extremities: no cyanosis, clubbing, rash, edema; cool extremities Neuro: alert & orientedx3, cranial nerves grossly intact. Moves all 4 extremities w/o difficulty. Affect pleasant.  EKG: ST 119 BPM   ASSESSMENT & PLAN:  1) Chronic combined systolic/diastolic HF: Biventricular    -- due to NICM EF 10% On home Milrinone 0.25 mcg. NYHA III.  Volume status low. Creatinine trending up as well as potassium    Instructed to hold torsemide for 2 days .  Continue digoxin 0.125 mg daily. Continue lisinopril 5 mg daily for now.  Hospice will evaluate today. For now continue [facility] with weekly lab work/PICC care.  He is not a candidate for LVAD due to biventricular HF.  Check BMET and dig level.    2) Diabetes with diabetic retinopathy- Legally blind  Follow up in 2-3 weeks   CLEGG,[name] 11:52 AM  Patient seen and examined with [name], NP. We discussed all aspects of the encounter. I agree with the assessment and plan as stated above.   He has end-stage HF. Symptomatically improved with milrinone but remains very tenuous. Volume status looks good. Will continue palliative milrinone. Hospice to see today. Not candidate for transplant or VAD. Check labs today.   Truman Hayward 12:39 PM

## 2013-07-23 ENCOUNTER — Emergency Department (HOSPITAL_COMMUNITY)
Admission: EM | Admit: 2013-07-23 | Discharge: 2013-07-23 | Disposition: A | Discharge status: Home / Self Care | Payer: Medicare Other | Attending: Emergency Medicine | Admitting: Emergency Medicine

## 2013-07-23 ENCOUNTER — Other Ambulatory Visit: Payer: Self-pay | Admitting: Family

## 2013-07-23 ENCOUNTER — Emergency Department (HOSPITAL_COMMUNITY): Payer: Medicare Other

## 2013-07-23 ENCOUNTER — Other Ambulatory Visit (HOSPITAL_COMMUNITY): Payer: Self-pay | Admitting: Adult Health

## 2013-07-23 ENCOUNTER — Encounter (HOSPITAL_COMMUNITY): Payer: Self-pay | Admitting: Emergency Medicine

## 2013-07-23 DIAGNOSIS — I5023 Acute on chronic systolic (congestive) heart failure: Secondary | ICD-10-CM

## 2013-07-23 DIAGNOSIS — Y838 Other surgical procedures as the cause of abnormal reaction of the patient, or of later complication, without mention of misadventure at the time of the procedure: Secondary | ICD-10-CM | POA: Insufficient documentation

## 2013-07-23 DIAGNOSIS — Z95810 Presence of automatic (implantable) cardiac defibrillator: Secondary | ICD-10-CM | POA: Insufficient documentation

## 2013-07-23 DIAGNOSIS — E119 Type 2 diabetes mellitus without complications: Secondary | ICD-10-CM | POA: Insufficient documentation

## 2013-07-23 DIAGNOSIS — Z7982 Long term (current) use of aspirin: Secondary | ICD-10-CM | POA: Insufficient documentation

## 2013-07-23 DIAGNOSIS — T82598A Other mechanical complication of other cardiac and vascular devices and implants, initial encounter: Secondary | ICD-10-CM | POA: Insufficient documentation

## 2013-07-23 DIAGNOSIS — Z8669 Personal history of other diseases of the nervous system and sense organs: Secondary | ICD-10-CM | POA: Insufficient documentation

## 2013-07-23 DIAGNOSIS — Z95828 Presence of other vascular implants and grafts: Secondary | ICD-10-CM

## 2013-07-23 DIAGNOSIS — Z794 Long term (current) use of insulin: Secondary | ICD-10-CM | POA: Insufficient documentation

## 2013-07-23 DIAGNOSIS — Z880 Allergy status to penicillin: Secondary | ICD-10-CM | POA: Insufficient documentation

## 2013-07-23 DIAGNOSIS — R231 Pallor: Secondary | ICD-10-CM | POA: Insufficient documentation

## 2013-07-23 DIAGNOSIS — I10 Essential (primary) hypertension: Secondary | ICD-10-CM | POA: Insufficient documentation

## 2013-07-23 DIAGNOSIS — I509 Heart failure, unspecified: Secondary | ICD-10-CM | POA: Insufficient documentation

## 2013-07-23 DIAGNOSIS — Z79899 Other long term (current) drug therapy: Secondary | ICD-10-CM | POA: Insufficient documentation

## 2013-07-23 DIAGNOSIS — I5022 Chronic systolic (congestive) heart failure: Secondary | ICD-10-CM

## 2013-07-23 HISTORY — DX: Unqualified visual loss, both eyes: H54.3

## 2013-07-23 NOTE — ED Notes (Signed)
IV team paged and Dr Lynelle Doctor notified of pt.

## 2013-07-23 NOTE — ED Provider Notes (Signed)
CSN: [id number]     Arrival date & time 07/23/13  0930 History   First MD Initiated Contact with Patient 07/23/13 [phone]     Chief Complaint  Patient presents with  . PICC line removed    (Consider location/radiation/quality/duration/timing/severity/associated sxs/prior Treatment) HPI Patient is on a continuous infusion of milrinone. He has a PICC line in his right upper arm. He states it was intact last night when he went to bed. However when he woke up this morning the PICC line was lying on the bed. Patient states he feels fine. He denies any pain or shortness of breath. The PICC line was examined and it appears to be 42 cm in length and appears to be intact.  PCP Dr Orvan Falconer Cardiologist Dr Jones Broom  Past Medical History  Diagnosis Date  . CHF (congestive heart failure)   . Diabetes mellitus without complication   . Hypertension   . Impaired vision in both eyes    Past Surgical History  Procedure Laterality Date  . Cardiac defibrillator placement    . Esophagogastroduodenoscopy N/A 07/09/2013    Procedure: ESOPHAGOGASTRODUODENOSCOPY (EGD);  Surgeon: Hart Carwin, MD;  [facility];  Service: Endoscopy;  Laterality: N/A;   Family History  Problem Relation Age of Onset  . Heart disease Father    History  Substance Use Topics  . Smoking status: Never Smoker   . Smokeless tobacco: Not on file  . Alcohol Use: No  lives at home Lives with daughter  Review of Systems  All other systems reviewed and are negative.    Allergies  Penicillins and Sulfa antibiotics  Home Medications   Current Outpatient Rx  Name  Route  Sig  Dispense  Refill  . aspirin EC 81 MG tablet   Oral   Take 81 mg by mouth daily.         . digoxin (LANOXIN) 0.125 MG tablet   Oral   Take 1 tablet (0.125 mg total) by mouth daily.   30 tablet   3   . glucose blood test strip      Use as instructed   100 each   12     accu chek test strips   . insulin glargine (LANTUS) 100  UNIT/ML injection   Subcutaneous   Inject 0.05 mLs (5 Units total) into the skin at bedtime.   10 mL   11   . lisinopril (PRINIVIL,ZESTRIL) 10 MG tablet   Oral   Take 0.5 tablets (5 mg total) by mouth daily.   30 tablet   6   . milrinone (PRIMACOR) 20 MG/100ML SOLN infusion   Intravenous   Inject 23.25 mcg/min into the vein continuous.   100 mL      . ondansetron (ZOFRAN) 4 MG tablet   Oral   Take 1 tablet (4 mg total) by mouth every 6 (six) hours as needed for nausea.   20 tablet   0   . pantoprazole (PROTONIX) 20 MG tablet   Oral   Take 1 tablet (20 mg total) by mouth daily.   30 tablet   5   . sertraline (ZOLOFT) 50 MG tablet   Oral   Take 1 tablet (50 mg total) by mouth daily.   30 tablet   3   . simvastatin (ZOCOR) 5 MG tablet   Oral   Take 5 mg by mouth at bedtime.         . torsemide (DEMADEX) 20 MG tablet   Oral  Take 3 tablets (60 mg total) by mouth daily.   30 tablet   0   . zolpidem (AMBIEN) 5 MG tablet   Oral   Take 5 mg by mouth at bedtime as needed for sleep.          BP 113/82  Pulse 118  Temp(Src) 98.5 F (36.9 C) (Oral)  Wt 135 lb (61.236 kg)  SpO2 96%  Vital signs normal except tachycardia  Physical Exam  Nursing note and vitals reviewed. Constitutional: He is oriented to person, place, and time. He appears well-developed and well-nourished.  Non-toxic appearance. He does not appear ill. No distress.  HENT:  Head: Normocephalic and atraumatic.  Right Ear: External ear normal.  Left Ear: External ear normal.  Nose: Nose normal. No mucosal edema or rhinorrhea.  Mouth/Throat: Oropharynx is clear and moist and mucous membranes are normal. No dental abscesses or uvula swelling.  Eyes: Conjunctivae and EOM are normal. Pupils are equal, round, and reactive to light.  Neck: Normal range of motion and full passive range of motion without pain. Neck supple.  Cardiovascular: Normal rate, regular rhythm and normal heart sounds.  Exam  reveals no gallop and no friction rub.   No murmur heard. Pulmonary/Chest: Effort normal and breath sounds normal. No respiratory distress. He has no wheezes. He has no rhonchi. He has no rales. He exhibits no tenderness and no crepitus.  Abdominal: Soft. Normal appearance and bowel sounds are normal. He exhibits no distension. There is no tenderness. There is no rebound and no guarding.  Musculoskeletal: Normal range of motion. He exhibits no edema and no tenderness.  Moves all extremities well.  Tegaderm still over the PICC site, minimal blood present. Arm is nontender.   Neurological: He is alert and oriented to person, place, and time. He has normal strength. No cranial nerve deficit.  Skin: Skin is warm, dry and intact. No rash noted. No erythema. There is pallor.  Psychiatric: He has a normal mood and affect. His speech is normal and behavior is normal. His mood appears not anxious.    ED Course  Procedures (including critical care time)   Chest x-ray done to make sure PICC line was intact.  13:22 pt has had his PICC reinserted. [facility] was here and went to get new pump.   Labs Review Labs Reviewed - No data to display Imaging Review Dg Chest Portable 1 View  07/23/2013   CLINICAL DATA:  Status post PICC line removal.  EXAM: PORTABLE CHEST - 1 VIEW  COMPARISON:  July 05, 2013  FINDINGS: The heart size and mediastinal contours are stable. Cardiac pacemaker is unchanged. No PICC line is identified. Both lungs are clear. The visualized skeletal structures are stable.  IMPRESSION: No active cardiopulmonary disease.  No PICC line identified.   Electronically Signed   By: Sherian ReinWei-Chen  Lin M.D.   On: 07/23/2013 10:31    EKG Interpretation    Date/Time:  Thursday July 23 2013 09:42:30 EST Ventricular Rate:  118 PR Interval:  169 QRS Duration: 102 QT Interval:  314 QTC Calculation: 440 R Axis:   68 Text Interpretation:  Age not entered, assumed to be  68 years old for  purpose of ECG interpretation Sinus tachycardia LVH with secondary repolarization abnormality ST depression, consider ischemia, diffuse lds No significant change since last tracing yesterday Confirmed by Darriel Utter  MD-I, Zeno Hickel ([id number]) on 07/23/2013 10:59:54 AM            MDM   1.  S/P PICC central line placement     Plan discharge  Devoria Albe, MD, Franz Dell, MD 07/23/13 [phone]

## 2013-07-23 NOTE — Discharge Instructions (Signed)
Recheck as needed °

## 2013-07-23 NOTE — ED Notes (Signed)
Pt daughter very agitated and aggravated, c/o no help from home health with father at home. States that she has been trying to get home health aid ordered for her father with no success. Pt tearful and stressed with taking care of her father by herself. Pt also states that no one has ever shown her how to do a condom catheter, pt also upset that home health is taking a long time to come attach pt milirinone to PICC line. RN printed off step by step instructions on how to place condom cath and went over with daughter at bedside. Pt also given information about case management, Kim from case management has been called and made aware of situation and has been in touch with home health.

## 2013-07-23 NOTE — Procedures (Signed)
Successful placement of dual lumen PICC line to right basilic vein. Length 43cm Tip at lower SVC/RA No complications Ready for use.   Pattricia Boss PA-C Interventional Radiology  07/23/13  12:40 PM

## 2013-07-23 NOTE — ED Notes (Signed)
IV team verified that PICC line is complete and did not break.

## 2013-07-23 NOTE — Addendum Note (Signed)
Encounter addended by: Theresia Bough, CMA on: 07/23/2013  4:21 PM<BR>     Documentation filed: Orders

## 2013-07-23 NOTE — Progress Notes (Signed)
   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  They spoke with daughter Gary Gallegos CM made attempt to call caregiver   Anticipated DC Date:  07/23/2013     Status Recommendation to Physician:   Result of Recommendation:    Other ED Services  Consult Working Plan    DC Planning Services  Other   [facility] Choice  HOME HEALTH  DURABLE MEDICAL EQUIPMENT   Choice offered to / List presented to:  C-4 Adult Children  DME arranged  IV PUMP/EQUIPMENT  HOSPITAL BED     DME agency  [facility]   [facility] arranged  HH-1 RN  HH-4 NURSE'S AIDE      HH agency  [facility]    Status of service:  Completed, signed off  ED Comments:   ED Comments Detail:  07/23/13 1358 ED CM spoke with faye or faith, daughter and caregiver via telephone in pt's room A11C. CM allowed her to ventilate her feelings Daughter voiced that she was angry, unable to do all care by herself and was informed to place pt's clothes on for d/c home Voiced concern of not having a HH Aide and not receiving assist to put pt's clothes on in ED 07/23/13 Cm assessed and reviewed that pt should be able to receive an aide in home with an order. Home health Rn resumed and aide initiated in [facility] CM reviewed daughter concerns  with Gary Gallegos, ED RN who will go over how to place a condom cath on pt with daughter also. CM updated Pam of [facility] on orders initiated in EPIC for aide, EDP, I Knapp updated on order and plan of care for pt  Care giver telephone number [phone] states unavailable CM provided ED RN with contact number for [facility] coordinator number [phone] to assist with coordination of care for discharge home

## 2013-07-23 NOTE — ED Notes (Signed)
Spoke with IR.  They will come and get pt to place PICC.  Stated [facility] normally starts med again.

## 2013-07-23 NOTE — Progress Notes (Signed)
   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  c/o PICC line removal at [facility] ED for re insertion of PICC via IR     Subjective/Objective Assessment Detail:     Action/Plan:   CM spoke with ED RN Duwayne Heck Cm left voice message for [facility]) coordinator to request assist with pt home medicine restart Cm received a return call from [facility] staff   Action/Plan Detail:   stating that they are aware and are working to get pt started on his medicine prior to d/c home and is aware of voiced need by pt's care giver for a hospital bed.  They spoke with daughter Gary Gallegos CM made attempt to call caregiver   Anticipated DC Date:  07/23/2013     Status Recommendation to Physician:   Result of Recommendation:    Other ED Services  Consult Working Plan    DC Planning Services  Other   [facility] Choice  HOME HEALTH  DURABLE MEDICAL EQUIPMENT   Choice offered to / List presented to:  C-1 Patient  DME arranged  IV PUMP/EQUIPMENT  HOSPITAL BED     DME agency  [facility]   [facility] arranged  HH-1 RN      [facility] agency  [facility]    Status of service:  Completed, signed off  ED Comments:   ED Comments Detail:  Care giver telephone number [phone] states unavailable CM provided ED RN with contact number for [facility] coordinator number [phone] to assist with coordination of care for discharge home

## 2013-07-23 NOTE — Addendum Note (Signed)
Encounter addended by: Simon Rhein, CCT on: 07/23/2013  8:46 AM<BR>     Documentation filed: Charges VN

## 2013-07-23 NOTE — ED Notes (Addendum)
Pt has R PICC for milrinone drip.  Pt woke up around 0845 and realized his picc had been pulled out.  No swelling to site.  Pt denies sob or chest pain.  PICC end does not appear intact.

## 2013-07-23 NOTE — Addendum Note (Signed)
Addended by: Bueford Arp, Milagros Reap on: 07/23/2013 04:29 PM   Modules accepted: Orders

## 2013-07-24 ENCOUNTER — Telehealth (HOSPITAL_COMMUNITY): Payer: Self-pay | Admitting: Radiology

## 2013-07-24 ENCOUNTER — Other Ambulatory Visit: Payer: Self-pay

## 2013-07-24 ENCOUNTER — Telehealth (HOSPITAL_COMMUNITY): Payer: Self-pay

## 2013-07-24 MED ORDER — GLUCOSE BLOOD VI STRP: ORAL_STRIP | Status: DC

## 2013-07-24 NOTE — Telephone Encounter (Signed)
Daughter called to inform that hospice called today and did not consider Gary Gallegos as hospice-appropriate due to milrinone gtt and ICD.

## 2013-07-28 ENCOUNTER — Telehealth (HOSPITAL_COMMUNITY): Payer: Self-pay

## 2013-07-28 ENCOUNTER — Telehealth (HOSPITAL_COMMUNITY): Payer: Self-pay | Admitting: Anesthesiology

## 2013-07-28 MED ORDER — POTASSIUM CHLORIDE CRYS ER 20 MEQ PO TBCR: 20.0000 meq | EXTENDED_RELEASE_TABLET | Freq: Every day | ORAL | Status: DC

## 2013-07-28 NOTE — Addendum Note (Signed)
Addended by: Ulla Potash B on: 07/28/2013 04:01 PM   Modules accepted: Orders

## 2013-07-28 NOTE — Telephone Encounter (Signed)
Called patient's daughter about weekly labs. K+ 3.3. Will have her take 20 meq (1 tablet) today and then start 20 meq daily. Will recheck next week. In the past has had hyperkalemia. Would not restart spiro at this time with SBP 90s.   Ulla Potash B  NP-C  3:41 PM

## 2013-07-28 NOTE — Telephone Encounter (Signed)
Patient's wife called and made aware of increased dig level, instructed to hold dig until further notice.  Also made aware to go to ED if patient has unusual s/s. [facility] Filter

## 2013-08-03 ENCOUNTER — Encounter: Payer: Self-pay | Admitting: Internal Medicine

## 2013-08-04 ENCOUNTER — Ambulatory Visit: Payer: Medicare Other

## 2013-08-05 ENCOUNTER — Inpatient Hospital Stay (HOSPITAL_COMMUNITY): Admission: RE | Admit: 2013-08-05 | Payer: Medicare Other | Source: Ambulatory Visit

## 2013-08-05 ENCOUNTER — Encounter (HOSPITAL_COMMUNITY): Payer: Self-pay

## 2013-08-05 ENCOUNTER — Other Ambulatory Visit: Payer: Self-pay | Admitting: Family

## 2013-08-11 ENCOUNTER — Telehealth (HOSPITAL_COMMUNITY): Payer: Self-pay | Admitting: Cardiology

## 2013-08-11 MED ORDER — MAGNESIUM OXIDE -MG SUPPLEMENT 200 MG PO TABS: 200.0000 mg | ORAL_TABLET | Freq: Every day | ORAL | Status: DC

## 2013-08-11 NOTE — Telephone Encounter (Signed)
[facility] called to report critical lab values magnesium 1.7

## 2013-08-11 NOTE — Telephone Encounter (Signed)
Received copy of pt's labs from [facility], they were drawn on 2/23 at 4 pm and glucose was 524, called back and spoke w/pt's daughter she states he has been sneaking her kids kool-aid which is very sweet but she checked his CBG today and it was 173, advised her to f/u with pt's pcp she is agreeable

## 2013-08-11 NOTE — Telephone Encounter (Signed)
Discussed w/Amy Filbert Schilder, NP will add mag-ox 200 mg daily, called and spoke w/pt's daughter [name], she is aware rx sent in

## 2013-08-12 ENCOUNTER — Encounter (HOSPITAL_COMMUNITY): Payer: Self-pay

## 2013-08-13 ENCOUNTER — Encounter (HOSPITAL_COMMUNITY): Payer: Medicare Other

## 2013-08-18 ENCOUNTER — Encounter (HOSPITAL_COMMUNITY): Payer: Self-pay

## 2013-08-18 ENCOUNTER — Encounter: Payer: Self-pay | Admitting: Internal Medicine

## 2013-08-18 ENCOUNTER — Ambulatory Visit (HOSPITAL_COMMUNITY)
Admission: RE | Admit: 2013-08-18 | Discharge: 2013-08-18 | Disposition: A | Discharge status: Home / Self Care | Payer: Medicare Other | Source: Ambulatory Visit | Attending: Internal Medicine | Admitting: Internal Medicine

## 2013-08-18 VITALS — BP 110/66 | HR 134 | Resp 18 | Wt 134.5 lb

## 2013-08-18 DIAGNOSIS — I5022 Chronic systolic (congestive) heart failure: Secondary | ICD-10-CM | POA: Insufficient documentation

## 2013-08-18 MED ORDER — MILRINONE IN DEXTROSE 20 MG/100ML IV SOLN: 0.1250 ug/kg/min | INTRAVENOUS | Status: DC

## 2013-08-18 NOTE — Patient Instructions (Signed)
Follow up next week   Do the following things EVERYDAY: 1) Weigh yourself in the morning before breakfast. Write it down and keep it in a log. 2) Take your medicines as prescribed 3) Eat low salt foods-Limit salt (sodium) to 2000 mg per day.  4) Stay as active as you can everyday 5) Limit all fluids for the day to less than 2 liters 

## 2013-08-18 NOTE — Progress Notes (Addendum)
Patient ID: Gary Gallegos, male   DOB: 1946/05/24, 68 y.o.   MRN: [id number] PCP: Dr. Adline MangoPadonda Campbell, FNP Primary Cardiologist: Dr. Andreas OhmKatrina Gallegos  HPI: Gary Gallegos is a 68 yo maOrlean Pattenle referred to the HF clinic by GaryNelson. He has a history of DM2 with severe diabetic retinopathy legally blind September , cataracts HTN, HLD, chronic systolic HF due to severe, EF 08%10% (11/14) s/p ICD Medtronic.   Started having problems with his heart in early 2013. [facility]. Had cath in 2013 and arteries were "fine".  ECHO 05/05/13: EF 10%, diff HK, grade II DD, mild MR, LA mod dilated, RV mildly dilated and sys fx mod reduced, mod TR  [facility]/V/D. EGD  Showed severe gastritis and esophagitis. Started on Milrinone 0.25 mcg via PICC. Discharge weight 133 pounds. [facility]   He returns for follow up with Gary Gallegos and son in law. Overall he is feeling much better. Denies SOB/PND/Orthopnea. Has follow up with ophthalmologist  today. Weight at home 1127-134 pounds. [facility] following for home Milrinone at 0.375 mcg and weekly labs. He continues to use condom cath for urinary incontinence and they have been using condom catheter. He requires assistance with all ADLs. Appetite improving. His daughter Gary GoltzFaith prepares all his medications and meals.   Labs 07/15/13 K 5.7 Creatinine 1.95 instructed to cut lisinopril 5 mg daily.  Labs 07/22/13 K 3.6 Creatinine 0.81 Dig 1.9 digoxin stopped 08/17/13 K 3.7 Creatinine 0.8 Glucose 190  SH: Lives his daughter,  son in law, and 2 granddaughters.     ROS: All systems negative except as listed in HPI, PMH and Problem List.  Past Medical History  Diagnosis Date  . CHF (congestive heart failure)   . Diabetes mellitus without complication   . Hypertension   . Impaired vision in both eyes     Current Outpatient Prescriptions  Medication Sig Dispense Refill  . aspirin EC 81 MG tablet Take 81 mg by mouth daily.      Marland Kitchen.  glucose blood (ACCU-CHEK SMARTVIEW) test strip Use to check blood sugar once daily  100 each  12  . insulin glargine (LANTUS) 100 UNIT/ML injection Inject 0.05 mLs (5 Units total) into the skin at bedtime.  10 mL  11  . lisinopril (PRINIVIL,ZESTRIL) 10 MG tablet Take 0.5 tablets (5 mg total) by mouth daily.  30 tablet  6  . Magnesium Oxide 200 MG TABS Take 1 tablet (200 mg total) by mouth daily.  30 tablet  3  . milrinone (PRIMACOR) 20 MG/100ML SOLN infusion Inject 23.25 mcg/min into the vein continuous.  100 mL    . ondansetron (ZOFRAN) 4 MG tablet Take 1 tablet (4 mg total) by mouth every 6 (six) hours as needed for nausea.  20 tablet  0  . pantoprazole (PROTONIX) 20 MG tablet Take 1 tablet (20 mg total) by mouth daily.  30 tablet  5  . potassium chloride SA (K-DUR,KLOR-CON) 20 MEQ tablet Take 1 tablet (20 mEq total) by mouth daily.  90 tablet  3  . sertraline (ZOLOFT) 50 MG tablet take 1 tablet by mouth once daily  30 tablet  3  . simvastatin (ZOCOR) 5 MG tablet Take 5 mg by mouth at bedtime.      . torsemide (DEMADEX) 20 MG tablet Take 3 tablets (60 mg total) by mouth daily.  30 tablet  0  . zolpidem (AMBIEN) 5 MG tablet take 1 tablet by mouth  at bedtime if needed  30 tablet  3   No current facility-administered medications for this encounter.    Filed Vitals:   08/18/13 1142  BP: 110/66  Pulse: 134  Resp: 18  Weight: 134 lb 8 oz (61.009 kg)  SpO2: 100%    PHYSICAL EXAM: General: Chronically ill appearing. No resp difficulty, sitting in WC, daughter present HEENT: normal; arcus senilus. Lenses don't appear clouded Neck: supple. JVP~5-6. Carotids 2+ bilaterally; no bruits. No lymphadenopathy or thryomegaly appreciated. Cor: PMI laterally displaced.Tachycardic regulaNo rubs. + S3; +3/6 systolic murmur Lungs: clear Abdomen: soft, nontender, Non distended. No hepatosplenomegaly. No bruits or masses. Good bowel sounds. Extremities: no cyanosis, clubbing, rash, edema; cool  extremities Neuro: alert & orientedx3, cranial nerves grossly intact. Moves all 4 extremities w/o difficulty. Affect pleasant.  EKG: ST 132 bpm    ASSESSMENT & PLAN:  1) Chronic combined systolic/diastolic HF: Biventricular    -- due to NICM EF 10% On home Milrinone 0.25 mcg. Will need to cut back to 0.25 mcg NYHA III. He is not on bb due to low output HF. Optivol- well below threshold Activity < 1 hour. HR > 130s. Dr Gala Gallegos reviewed and discussed EKG.  Volume status stable. Continue torsemide 60 mg daily and potassium 20 meq daily.   Continue digoxin 0.125 mg daily. Continue lisinopril 5 mg daily for now.  Continue [facility] with weekly lab work/PICC care.  He is not a candidate for LVAD due to biventricular HF. Check BMET weekly .    2)  Uncontrolled Diabetes with diabetic retinopathy- Legally blind. Have instructed to contact PCP.  3) Sinus Tachycardia- Medtronic ICD interrrogated. Comes down with carotid massage. Will need to cut back Milrinone to 0.125 mcg. Notified [facility] regarding change.   Follow up next week with EKG.   CLEGG,[name] 11:51 AM  Patient seen and examined with Gary Becket, NP. We discussed all aspects of the encounter. I agree with the assessment and plan as stated above.   He feels much better but remains very tenuous with prominent tachycardia. ICD interrogated and appears to be sinus tach. Will cut milrinone back slightly and see if this helps. Not VAD or transplant candidate at this point. Will continue to follow closely. Lengthy discussion with patient and his family about how tenuous he is.   Total time spent 45 minutes. Over half that time spent discussing above.   Gary Seher,MD 3:56 PM

## 2013-08-20 ENCOUNTER — Other Ambulatory Visit (HOSPITAL_COMMUNITY): Payer: Self-pay | Admitting: Internal Medicine

## 2013-08-24 ENCOUNTER — Encounter (HOSPITAL_COMMUNITY): Payer: Self-pay

## 2013-08-24 ENCOUNTER — Ambulatory Visit (HOSPITAL_COMMUNITY)
Admission: RE | Admit: 2013-08-24 | Discharge: 2013-08-24 | Disposition: A | Discharge status: Home / Self Care | Payer: Medicare Other | Source: Ambulatory Visit | Attending: Internal Medicine | Admitting: Internal Medicine

## 2013-08-24 VITALS — BP 113/76 | HR 129 | Resp 18 | Wt 136.0 lb

## 2013-08-24 DIAGNOSIS — E119 Type 2 diabetes mellitus without complications: Secondary | ICD-10-CM | POA: Insufficient documentation

## 2013-08-24 DIAGNOSIS — I5022 Chronic systolic (congestive) heart failure: Secondary | ICD-10-CM | POA: Insufficient documentation

## 2013-08-24 MED ORDER — MILRINONE IN DEXTROSE 20 MG/100ML IV SOLN: 0.2500 ug/kg/min | INTRAVENOUS | Status: DC

## 2013-08-24 NOTE — Progress Notes (Signed)
Patient ID: Gary Gallegos, male   DOB: 06/29/1945, 68 y.o.   MRN: [id number] PCP: Dr. Adline MangoPadonda Campbell, FNP Primary Cardiologist: Dr. Andreas OhmKatrina Gallegos  HPI: [name] is a 68 yo male referred to the HF clinic by Dr.Nelson. He has a history of DM2 with severe diabetic retinopathy legally blind September , cataracts HTN, HLD, chronic systolic HF due to severe, EF 40%10% (11/14) s/p ICD Medtronic.   Started having problems with his heart in early 2013. [facility]. Had cath in 2013 and arteries were "fine".  ECHO 05/05/13: EF 10%, diff HK, grade II DD, mild MR, LA mod dilated, RV mildly dilated and sys fx mod reduced, mod TR  [facility]D. EGD  Showed severe gastritis and esophagitis. Started on Milrinone 0.25 mcg via PICC. Discharge weight 133 pounds. [facility].   He returns for follow up. Overall he feels well. Complains of eye discomfort. Denies SOB/PND/Orthopnea.  Weight at home 134-135 pounds. Last visit Milrinone was cut back to 0.25 mcg but [facility]  So he remained on 0.375 mcg. Continue weekly labs. He continues to use condom cath for urinary incontinence and they have been using condom catheter. He requires assistance with all ADLs. Appetite improving. His daughter Gary Gallegos prepares all his medications and meals. Plans to follow up with PCP this week for DM.   Labs 07/15/13 K 5.7 Creatinine 1.95 instructed to cut lisinopril 5 mg daily.  Labs 07/22/13 K 3.6 Creatinine 0.81 Dig 1.9 digoxin stopped Labs  08/17/13 K 3.7 Creatinine 0.8 Glucose 190  SH: Lives his daughter,  son in law, and 2 granddaughters.     ROS: All systems negative except as listed in HPI, PMH and Problem List.  Past Medical History  Diagnosis Date  . CHF (congestive heart failure)   . Diabetes mellitus without complication   . Hypertension   . Impaired vision in both eyes     Current Outpatient Prescriptions  Medication Sig  Dispense Refill  . aspirin EC 81 MG tablet Take 81 mg by mouth daily.      Marland Kitchen. glucose blood (ACCU-CHEK SMARTVIEW) test strip Use to check blood sugar once daily  100 each  12  . insulin glargine (LANTUS) 100 UNIT/ML injection Inject 0.05 mLs (5 Units total) into the skin at bedtime.  10 mL  11  . lisinopril (PRINIVIL,ZESTRIL) 10 MG tablet Take 0.5 tablets (5 mg total) by mouth daily.  30 tablet  6  . Magnesium Oxide 200 MG TABS Take 1 tablet (200 mg total) by mouth daily.  30 tablet  3  . milrinone (PRIMACOR) 20 MG/100ML SOLN infusion Inject 7.75 mcg/min into the vein continuous.  100 mL    . ondansetron (ZOFRAN) 4 MG tablet Take 1 tablet (4 mg total) by mouth every 6 (six) hours as needed for nausea.  20 tablet  0  . pantoprazole (PROTONIX) 20 MG tablet Take 1 tablet (20 mg total) by mouth daily.  30 tablet  5  . potassium chloride SA (K-DUR,KLOR-CON) 20 MEQ tablet Take 1 tablet (20 mEq total) by mouth daily.  90 tablet  3  . sertraline (ZOLOFT) 50 MG tablet take 1 tablet by mouth once daily  30 tablet  3  . simvastatin (ZOCOR) 5 MG tablet Take 5 mg by mouth at bedtime.      . torsemide (DEMADEX) 20 MG tablet Take 3 tablets (60 mg total) by  mouth daily.  30 tablet  0  . zolpidem (AMBIEN) 5 MG tablet take 1 tablet by mouth at bedtime if needed  30 tablet  3   No current facility-administered medications for this encounter.    Filed Vitals:   08/24/13 1055  BP: 113/76  Pulse: 129  Resp: 18  Weight: 136 lb (61.689 kg)  SpO2: 99%    PHYSICAL EXAM: General: Chronically ill appearing. No resp difficulty, sitting in WC, daughter present HEENT: normal; arcus senilus. Lenses don't appear clouded Neck: supple. JVP~5-6. Carotids 2+ bilaterally; no bruits. No lymphadenopathy or thryomegaly appreciated. Cor: PMI laterally displaced.Tachycardic regulaNo rubs. + S3; +3/6 systolic murmur Lungs: clear Abdomen: soft, nontender, Non distended. No hepatosplenomegaly. No bruits or masses. Good bowel  sounds. Extremities: no cyanosis, clubbing, rash, edema; RUE picc Neuro: alert & orientedx3, cranial nerves grossly intact. Moves all 4 extremities w/o difficulty. Affect pleasant.  EKG: ST 125 BPM   ASSESSMENT & PLAN:  1) Chronic combined systolic/diastolic HF: Biventricular    -- due to NICM EF 10% On home Milrinone 0.375 mcg. Last visit we recommended to cut back Milrinone to 0.25 mcg but his rate was not changed by [facility]. [facility] rep changed rate during this visit to 0.25 mcg. He is not on bb due to low output HF.Volume status stable. Continue torsemide 60 mg daily and potassium 20 meq daily.   Continue digoxin 0.125 mg daily. Continue lisinopril 5 mg daily for now.  Continue [facility] with weekly lab work/PICC care.  He is not a candidate for LVAD due to biventricular HF. Check BMET weekly.  2)  Uncontrolled Diabetes with diabetic retinopathy- Legally blind. Has appointment with PCP this week.  3) Sinus Tachycardia- Medtronic ICD interrrogated. Comes down with carotid massage. Will need to cut back Milrinone to 0.125 mcg. Notified [facility] regarding change.   Follow up in 2 weeks    Gary Kerstein NP-C 11:01 AM

## 2013-08-24 NOTE — Patient Instructions (Signed)
Follow up in 2 weeks  Do the following things EVERYDAY: 1) Weigh yourself in the morning before breakfast. Write it down and keep it in a log. 2) Take your medicines as prescribed 3) Eat low salt foods--Limit salt (sodium) to 2000 mg per day.  4) Stay as active as you can everyday 5) Limit all fluids for the day to less than 2 liters 

## 2013-08-27 ENCOUNTER — Ambulatory Visit: Payer: Medicare Other | Admitting: Family

## 2013-08-27 DIAGNOSIS — Z0289 Encounter for other administrative examinations: Secondary | ICD-10-CM

## 2013-09-01 ENCOUNTER — Encounter: Payer: Self-pay | Admitting: Internal Medicine

## 2013-09-02 ENCOUNTER — Encounter: Payer: Self-pay | Admitting: Internal Medicine

## 2013-09-04 ENCOUNTER — Encounter: Payer: Self-pay | Admitting: Internal Medicine

## 2013-09-07 ENCOUNTER — Ambulatory Visit (HOSPITAL_COMMUNITY)
Admission: RE | Admit: 2013-09-07 | Discharge: 2013-09-07 | Disposition: A | Discharge status: Home / Self Care | Payer: Medicare Other | Source: Ambulatory Visit | Attending: Internal Medicine | Admitting: Internal Medicine

## 2013-09-07 ENCOUNTER — Encounter (HOSPITAL_COMMUNITY): Payer: Self-pay

## 2013-09-07 VITALS — BP 105/76 | HR 119 | Resp 18 | Wt 138.4 lb

## 2013-09-07 DIAGNOSIS — R000 Tachycardia, unspecified: Secondary | ICD-10-CM

## 2013-09-07 DIAGNOSIS — I10 Essential (primary) hypertension: Secondary | ICD-10-CM

## 2013-09-07 DIAGNOSIS — I5022 Chronic systolic (congestive) heart failure: Secondary | ICD-10-CM

## 2013-09-07 DIAGNOSIS — I5042 Chronic combined systolic (congestive) and diastolic (congestive) heart failure: Secondary | ICD-10-CM | POA: Insufficient documentation

## 2013-09-07 DIAGNOSIS — E119 Type 2 diabetes mellitus without complications: Secondary | ICD-10-CM

## 2013-09-07 DIAGNOSIS — I498 Other specified cardiac arrhythmias: Secondary | ICD-10-CM | POA: Insufficient documentation

## 2013-09-07 DIAGNOSIS — E11319 Type 2 diabetes mellitus with unspecified diabetic retinopathy without macular edema: Secondary | ICD-10-CM | POA: Insufficient documentation

## 2013-09-07 DIAGNOSIS — E1139 Type 2 diabetes mellitus with other diabetic ophthalmic complication: Secondary | ICD-10-CM | POA: Insufficient documentation

## 2013-09-07 DIAGNOSIS — E1165 Type 2 diabetes mellitus with hyperglycemia: Secondary | ICD-10-CM

## 2013-09-07 MED ORDER — CARVEDILOL 3.125 MG PO TABS
3.1250 mg | ORAL_TABLET | Freq: Two times a day (BID) | ORAL | Status: DC
Start: 2013-09-07 — End: 2014-02-19

## 2013-09-07 NOTE — Progress Notes (Signed)
Patient ID: Gary Gallegos, male   DOB: [birth year], 68 y.o.   MRN: [id number]  PCP: Dr. Adline Mango, FNP Primary Cardiologist: Dr. [name]  HPI: Gary Gallegos is a 68 yo male referred to the HF clinic by Dr.Nelson. He has a history of DM2 with severe diabetic retinopathy legally blind September , cataracts HTN, HLD, chronic systolic HF due to severe, EF 98% (11/14) s/p ICD Medtronic.   Started having problems with his heart in early 2013. Was followed by cardiologist at [facility]. Had cath in 2013 and arteries were "fine".  ECHO 05/05/13: EF 10%, diff HK, grade II DD, mild MR, LA mod dilated, RV mildly dilated and sys fx mod reduced, mod TR  Admitted to [facility] in January with N/V/D. EGD  Showed severe gastritis and esophagitis. Started on Milrinone 0.25 mcg via PICC. Discharge weight 133 pounds. [facility].   Follow up: Doing well. On 08/24/13 had R eye surgery for R diabetic retinopathy (Dr. Allyne Gee) and is to follow up next month for operation on the L. Overall he feels well. Denies SOB, PND, Orthopnea or CP. Weight at home 135-137 lbs. Blood sugars at home 140-190s. Remains on milrinone 0.25 with  weekly labs (Thursdays). Larey Seat the other day. He continues to use condom cath for urinary incontinence and they have been using condom catheter. He requires assistance with all ADLs. Appetite improving. His daughter Gary Gallegos prepares all his medications and meals. Following salt diet and drinking less than 2L a day.   Labs 07/15/13 K 5.7 Creatinine 1.95 instructed to cut lisinopril 5 mg daily.  Labs 07/22/13 K 3.6 Creatinine 0.81 Dig 1.9 digoxin stopped Labs  08/17/13 K 3.7 Creatinine 0.8 Glucose 190           08/20/13: K+ 3.7, Creatinine 0.70, Glucose 123, Na 137  SH: Lives his daughter, son in law, and 2 granddaughters.     ROS: All systems negative except as listed in HPI, PMH and Problem List.  Past Medical History  Diagnosis Date  . CHF (congestive heart failure)   . Diabetes  mellitus without complication   . Hypertension   . Impaired vision in both eyes     Current Outpatient Prescriptions  Medication Sig Dispense Refill  . aspirin EC 81 MG tablet Take 81 mg by mouth daily.      Marland Kitchen glucose blood (ACCU-CHEK SMARTVIEW) test strip Use to check blood sugar once daily  100 each  12  . insulin glargine (LANTUS) 100 UNIT/ML injection Inject 0.05 mLs (5 Units total) into the skin at bedtime.  10 mL  11  . lisinopril (PRINIVIL,ZESTRIL) 10 MG tablet Take 0.5 tablets (5 mg total) by mouth daily.  30 tablet  6  . Magnesium Oxide 200 MG TABS Take 1 tablet (200 mg total) by mouth daily.  30 tablet  3  . milrinone (PRIMACOR) 20 MG/100ML SOLN infusion Inject 15.5 mcg/min into the vein continuous.  100 mL    . ondansetron (ZOFRAN) 4 MG tablet Take 1 tablet (4 mg total) by mouth every 6 (six) hours as needed for nausea.  20 tablet  0  . pantoprazole (PROTONIX) 20 MG tablet Take 1 tablet (20 mg total) by mouth daily.  30 tablet  5  . potassium chloride SA (K-DUR,KLOR-CON) 20 MEQ tablet Take 1 tablet (20 mEq total) by mouth daily.  90 tablet  3  . sertraline (ZOLOFT) 50 MG tablet take 1 tablet by mouth once daily  30 tablet  3  .  simvastatin (ZOCOR) 5 MG tablet Take 5 mg by mouth at bedtime.      . torsemide (DEMADEX) 20 MG tablet Take 3 tablets (60 mg total) by mouth daily.  30 tablet  0  . zolpidem (AMBIEN) 5 MG tablet take 1 tablet by mouth at bedtime if needed  30 tablet  3   No current facility-administered medications for this encounter.    Filed Vitals:   09/07/13 1115  BP: 105/76  Pulse: 119  Resp: 18  Weight: 138 lb 6 oz (62.766 kg)  SpO2: 100%    PHYSICAL EXAM: General: Chronically ill appearing. No resp difficulty, sitting in WC, daughter present HEENT: normal; arcus senilus. Lenses don't appear clouded Neck: supple. JVP~5-6. Carotids 2+ bilaterally; no bruits. No lymphadenopathy or thryomegaly appreciated. Cor: PMI laterally displaced.Tachycardic regulaNo  rubs. + S3; +3/6 systolic murmur Lungs: clear Abdomen: soft, nontender, Non distended. No hepatosplenomegaly. No bruits or masses. Good bowel sounds. Extremities: no cyanosis, clubbing, rash, edema; RUE picc Neuro: alert & orientedx3, cranial nerves grossly intact. Moves all 4 extremities w/o difficulty. Affect pleasant.  EKG: ST 116 BPM   ASSESSMENT & PLAN:  1) Chronic combined systolic/diastolic biventricular HF: NICM, EF 10%, Diff HK, grade II DD, RV sys fx mod reduced (04/2013),  - NYHA II symptoms and volume status stable. Will continue torsemide 60 mg daily and potassium 20 meq daily. Reviewed last labs and Cr 0.7 and K+ 3.7. [facility] - Will try to start low dose coreg today 3.125 mg BID. Instructed to call if notice any dizziness or fatigue. Discussed with Dr. Shirlee LatchMcLean. - Digoxin was stopped in the past he was on 0.125 mg daily (dig level was 1.9 07/2013) - Continue lisinopril 5 mg daily. - Reinforced the need and importance of daily weights, a low sodium diet, and fluid restriction (less than 2 L a day). Instructed to call the HF clinic if weight increases more than 3 lbs overnight or 5 lbs in a week. 2)  Uncontrolled Diabetes with diabetic retinopathy- Legally blind. Had surgery on R eye last week and reports now he can see and he is supposed to have L eye surgery in next couple weeks. 3) Sinus Tachycardia-  - He remains in ST today. As above will try to start low dose BB today.   F/U 1 month Gary Potashosgrove, Gary Lohmeyer B NP-C 11:20 AM

## 2013-09-07 NOTE — Patient Instructions (Signed)
Doing great.  Follow up with PCP about Diabetes.  Try to start coreg 3.125 mg twice a day. If you have the 6.25 mg tablets then take 1/2 tablet in the morning and 1/2 tablet in the evening.  Follow up 1 month.  Call any issues.  Do the following things EVERYDAY: 1) Weigh yourself in the morning before breakfast. Write it down and keep it in a log. 2) Take your medicines as prescribed 3) Eat low salt foods-Limit salt (sodium) to 2000 mg per day.  4) Stay as active as you can everyday 5) Limit all fluids for the day to less than 2 liters 6)

## 2013-09-08 ENCOUNTER — Telehealth: Payer: Self-pay | Admitting: Licensed Clinical Social Worker

## 2013-09-08 NOTE — Telephone Encounter (Signed)
CSW received referral to assist daughter with community resources for adult day care. Daughter reports she is interested in returning to work and not familiar with local resources to care for patient. CSW provided information on [facility] and [facility] for assessment and option for adult day programs. Daughter verbalized understanding of resources and follow up required. CSW continues to be available as needed. Lasandra Beech, LCSW [phone]

## 2013-09-09 DIAGNOSIS — I509 Heart failure, unspecified: Secondary | ICD-10-CM

## 2013-09-09 DIAGNOSIS — IMO0001 Reserved for inherently not codable concepts without codable children: Secondary | ICD-10-CM

## 2013-09-09 DIAGNOSIS — I5022 Chronic systolic (congestive) heart failure: Secondary | ICD-10-CM

## 2013-09-09 DIAGNOSIS — Z452 Encounter for adjustment and management of vascular access device: Secondary | ICD-10-CM

## 2013-09-09 DIAGNOSIS — E1165 Type 2 diabetes mellitus with hyperglycemia: Secondary | ICD-10-CM

## 2013-09-10 DIAGNOSIS — R000 Tachycardia, unspecified: Secondary | ICD-10-CM | POA: Insufficient documentation

## 2013-09-11 ENCOUNTER — Encounter: Payer: Self-pay | Admitting: Internal Medicine

## 2013-09-14 NOTE — Addendum Note (Signed)
Encounter addended by: Dolores Patty, MD on: 09/14/2013  3:57 PM<BR>     Documentation filed: Visit Diagnoses, Charges VN, Follow-up Section, LOS Section, Notes Section

## 2013-09-21 ENCOUNTER — Other Ambulatory Visit: Payer: Self-pay | Admitting: Cardiology

## 2013-09-21 ENCOUNTER — Encounter: Payer: Self-pay | Admitting: Internal Medicine

## 2013-09-22 ENCOUNTER — Telehealth: Payer: Self-pay | Admitting: Cardiology

## 2013-09-22 NOTE — Telephone Encounter (Signed)
New message         I was calling pt for f/u visit. Pt would like to know if there is something he can take for hiccups??

## 2013-09-23 NOTE — Telephone Encounter (Signed)
**Note De-Identified  Obfuscation** The pt is advised to contact his pcp. He states he is due to f/u with his pcp and is going to call today.

## 2013-09-24 ENCOUNTER — Telehealth (HOSPITAL_COMMUNITY): Payer: Self-pay | Admitting: Cardiology

## 2013-09-24 NOTE — Telephone Encounter (Signed)
Scott called to report pt has been drinking two 2L bottles of Gatorade Weight 139.6 lb 98/74 R-18 HR-112 o2 sat-94% RA BS-325  Scott advised to decrease Gatorade to 4 oz daily Please advise further if  needed

## 2013-10-01 ENCOUNTER — Encounter: Payer: Self-pay | Admitting: Anesthesiology

## 2013-10-08 ENCOUNTER — Encounter: Payer: Self-pay | Admitting: Internal Medicine

## 2013-10-08 ENCOUNTER — Encounter (HOSPITAL_COMMUNITY): Payer: Self-pay

## 2013-10-08 ENCOUNTER — Ambulatory Visit (HOSPITAL_COMMUNITY)
Admission: RE | Admit: 2013-10-08 | Discharge: 2013-10-08 | Disposition: A | Discharge status: Home / Self Care | Payer: Medicare Other | Source: Ambulatory Visit | Attending: Cardiology | Admitting: Cardiology

## 2013-10-08 VITALS — BP 108/82 | HR 116 | Wt 139.0 lb

## 2013-10-08 DIAGNOSIS — I498 Other specified cardiac arrhythmias: Secondary | ICD-10-CM

## 2013-10-08 DIAGNOSIS — E119 Type 2 diabetes mellitus without complications: Secondary | ICD-10-CM

## 2013-10-08 DIAGNOSIS — I10 Essential (primary) hypertension: Secondary | ICD-10-CM

## 2013-10-08 DIAGNOSIS — I5022 Chronic systolic (congestive) heart failure: Secondary | ICD-10-CM

## 2013-10-08 DIAGNOSIS — R000 Tachycardia, unspecified: Secondary | ICD-10-CM

## 2013-10-08 MED ORDER — MILRINONE IN DEXTROSE 20 MG/100ML IV SOLN: 0.1250 ug/kg/min | INTRAVENOUS | Status: DC

## 2013-10-08 NOTE — Patient Instructions (Signed)
Will decrease home milrinone to 0.125, call if you notice any changes such as increased fatigue or SOB.  Will follow up in 1 month with ECHO.  Do the following things EVERYDAY: 1) Weigh yourself in the morning before breakfast. Write it down and keep it in a log. 2) Take your medicines as prescribed 3) Eat low salt foods-Limit salt (sodium) to 2000 mg per day.  4) Stay as active as you can everyday 5) Limit all fluids for the day to less than 2 liters 6)

## 2013-10-08 NOTE — Progress Notes (Signed)
Patient ID: Gary Gallegos, male   DOB: 1945/08/02, 68 y.o.   MRN: [id number]  PCP: Dr. Adline MangoPadonda Campbell, FNP Primary Cardiologist: Dr. Andreas OhmKatrina Nelson  HPI: [name] is a 68 yo male referred to the HF clinic by Dr.Nelson. He has a history of DM2 with severe diabetic retinopathy legally blind September , cataracts HTN, HLD, chronic systolic HF due to severe, EF 40%10% (11/14) s/p ICD Medtronic.   Started having problems with his heart in early 2013. Was [facility]. Had cath in 2013 and arteries were "fine".  ECHO 05/05/13: EF 10%, diff HK, grade II DD, mild MR, LA mod dilated, RV mildly dilated and sys fx mod reduced, mod TR  [facility] with [facility]D. EGD  Showed severe gastritis and esophagitis. Started on Milrinone 0.25 mcg via PICC. Discharge weight 133 pounds. [facility]   Follow up: Last visit started low dose coreg 3.125 mg BID, which he tolerated. Feeling great. Reports his eyesight is getting better. Denies SOB, PND, orthopnea or CP. Weight at home increasing 139 lbs, appetite is great. No longer using condom cath he is able to go to the bathroom by himself. Walking with walker and able to go about 50 ft before legs get fatigued. Going to [place] this weekend to go to church this Sunday with her. No longer needing much assistance with ADLs. Still on milrinone 0.25. Goes next month to have eye surgery on R eye. Daughter takes care of his medications and he takes daily. Following salt diet and drinking less than 2L a day.   Labs 07/15/13 K 5.7 Creatinine 1.95 instructed to cut lisinopril 5 mg daily.  Labs 07/22/13 K 3.6 Creatinine 0.81 Dig 1.9 digoxin stopped Labs  08/17/13 K 3.7 Creatinine 0.8 Glucose 190           08/20/13: K+ 3.7, Creatinine 0.70, Glucose 123, Na 137  SH: Lives with his daughter, son in law, and 2 granddaughters.     ROS: All systems negative except as listed in HPI, PMH and Problem List.  Past Medical History   Diagnosis Date  . CHF (congestive heart failure)   . Diabetes mellitus without complication   . Hypertension   . Impaired vision in both eyes     Current Outpatient Prescriptions  Medication Sig Dispense Refill  . aspirin EC 81 MG tablet Take 81 mg by mouth daily.      . carvedilol (COREG) 3.125 MG tablet Take 1 tablet (3.125 mg total) by mouth 2 (two) times daily with a meal.  60 tablet  3  . glucose blood (ACCU-CHEK SMARTVIEW) test strip Use to check blood sugar once daily  100 each  12  . insulin glargine (LANTUS) 100 UNIT/ML injection Inject 0.05 mLs (5 Units total) into the skin at bedtime.  10 mL  11  . lisinopril (PRINIVIL,ZESTRIL) 5 MG tablet take 1 tablet by mouth once daily  30 tablet  3  . Magnesium Oxide 200 MG TABS Take 1 tablet (200 mg total) by mouth daily.  30 tablet  3  . milrinone (PRIMACOR) 20 MG/100ML SOLN infusion Inject 15.5 mcg/min into the vein continuous.  100 mL    . ondansetron (ZOFRAN) 4 MG tablet Take 1 tablet (4 mg total) by mouth every 6 (six) hours as needed for nausea.  20 tablet  0  . pantoprazole (PROTONIX) 20 MG tablet Take 1 tablet (20 mg total) by mouth daily.  30 tablet  5  .  potassium chloride SA (K-DUR,KLOR-CON) 20 MEQ tablet Take 1 tablet (20 mEq total) by mouth daily.  90 tablet  3  . sertraline (ZOLOFT) 50 MG tablet take 1 tablet by mouth once daily  30 tablet  3  . simvastatin (ZOCOR) 5 MG tablet Take 5 mg by mouth at bedtime.      . torsemide (DEMADEX) 20 MG tablet Take 3 tablets (60 mg total) by mouth daily.  30 tablet  0  . zolpidem (AMBIEN) 5 MG tablet take 1 tablet by mouth at bedtime if needed  30 tablet  3   No current facility-administered medications for this encounter.    Filed Vitals:   10/08/13 1007  BP: 108/82  Pulse: 116  Weight: 139 lb (63.05 kg)  SpO2: 100%    PHYSICAL EXAM: General: Chronically ill appearing. No resp difficulty, sitting in WC, daughter present HEENT: normal; arcus senilus. Lenses don't appear  clouded Neck: supple. JVP~5-6. Carotids 2+ bilaterally; no bruits. No lymphadenopathy or thryomegaly appreciated. Cor: PMI laterally displaced.Tachycardic regulaNo rubs. + S3; +3/6 systolic murmur Lungs: clear Abdomen: soft, nontender, Non distended. No hepatosplenomegaly. No bruits or masses. Good bowel sounds. Extremities: no cyanosis, clubbing, rash, edema; RUE picc Neuro: alert & orientedx3, cranial nerves grossly intact. Moves all 4 extremities w/o difficulty. Affect pleasant.    ASSESSMENT & PLAN:  1) Chronic combined systolic/diastolic biventricular HF: NICM, EF 10%, Diff HK, grade II DD, RV sys fx mod reduced (04/2013),  - NYHA II symptoms and volume status stable. Will continue torsemide 60 mg daily and potassium 20 meq daily. He gets weekly labs with [facility] and Cr has been stable. Patient has been doing much better and is now able to perform the majority of his ADLs and denies any SOB.  - Continue coreg 3.125 mg BID. - Will try to wean milrinone down to 0.125 today and see if he tolerates. Disussed with the daughter to call  - Digoxin was stopped in the past he was on 0.125 mg daily (dig level was 1.9 07/2013) - Continue lisinopril 5 mg daily. - Will repeat ECHO next visit.  - Reinforced the need and importance of daily weights, a low sodium diet, and fluid restriction (less than 2 L a day). Instructed to call the HF clinic if weight increases more than 3 lbs overnight or 5 lbs in a week. 2)  Diabetes with diabetic retinopathy-  Had surgery on R eye last month and reports his vision is much improved and he is supposed to be having eye surgery on the L next month. Discussed the need to continue follow blood sugars closely and to follow up with PCP.  3) Sinus Tachycardia-  - He remains in ST today. Continue low dose BB and as above will try to decrease milrinone to 0.125.   4) HTN- - Stable will continue current medications.   F/U 1 month with ECHO Aundria Rud NP-C 10:16 AM

## 2013-10-12 ENCOUNTER — Ambulatory Visit: Payer: Medicare Other | Admitting: Cardiology

## 2013-10-14 ENCOUNTER — Ambulatory Visit: Payer: Medicare Other | Admitting: Cardiology

## 2013-10-16 ENCOUNTER — Telehealth (HOSPITAL_COMMUNITY): Payer: Self-pay

## 2013-10-16 ENCOUNTER — Other Ambulatory Visit (HOSPITAL_COMMUNITY): Payer: Self-pay

## 2013-10-16 MED ORDER — MAGNESIUM OXIDE -MG SUPPLEMENT 200 MG PO TABS: 400.0000 mg | ORAL_TABLET | Freq: Two times a day (BID) | ORAL | Status: DC

## 2013-10-16 NOTE — Telephone Encounter (Signed)
Patient's daughter made aware of lab results, informed of low mag level and instructed for patient to increase mag ox to 400mg  PO BID.  Patient's daughter aware and agreeable.  New Rx sent to preferred pharmacy electronically. Ave Filter

## 2013-10-27 ENCOUNTER — Encounter: Payer: Self-pay | Admitting: Family

## 2013-10-30 ENCOUNTER — Encounter: Payer: Self-pay | Admitting: Internal Medicine

## 2013-11-04 ENCOUNTER — Ambulatory Visit (HOSPITAL_BASED_OUTPATIENT_CLINIC_OR_DEPARTMENT_OTHER)
Admission: RE | Admit: 2013-11-04 | Discharge: 2013-11-04 | Disposition: A | Discharge status: Home / Self Care | Payer: Medicare Other | Source: Ambulatory Visit | Attending: Internal Medicine | Admitting: Internal Medicine

## 2013-11-04 ENCOUNTER — Encounter (HOSPITAL_COMMUNITY): Payer: Self-pay

## 2013-11-04 ENCOUNTER — Ambulatory Visit (HOSPITAL_COMMUNITY)
Admission: RE | Admit: 2013-11-04 | Discharge: 2013-11-04 | Disposition: A | Discharge status: Home / Self Care | Payer: Medicare Other | Source: Ambulatory Visit | Attending: Internal Medicine | Admitting: Internal Medicine

## 2013-11-04 VITALS — BP 86/54 | HR 98 | Wt 146.0 lb

## 2013-11-04 DIAGNOSIS — I059 Rheumatic mitral valve disease, unspecified: Secondary | ICD-10-CM

## 2013-11-04 DIAGNOSIS — I509 Heart failure, unspecified: Secondary | ICD-10-CM | POA: Insufficient documentation

## 2013-11-04 DIAGNOSIS — I5022 Chronic systolic (congestive) heart failure: Secondary | ICD-10-CM

## 2013-11-04 MED ORDER — POTASSIUM CHLORIDE CRYS ER 20 MEQ PO TBCR: 20.0000 meq | EXTENDED_RELEASE_TABLET | Freq: Two times a day (BID) | ORAL | Status: DC

## 2013-11-04 MED ORDER — TORSEMIDE 20 MG PO TABS: 60.0000 mg | ORAL_TABLET | Freq: Two times a day (BID) | ORAL | Status: DC

## 2013-11-04 NOTE — Progress Notes (Signed)
Patient ID: Gary Gallegos, male   DOB: 01-14-1946, 68 y.o.   MRN: [id number]  PCP: Dr. Adline Mango, FNP Primary Cardiologist: Dr. [name]  HPI: Gary Gallegos is a 68 yo male referred to the HF clinic by Dr.Nelson. Gary Gallegos has a history of DM2 with severe diabetic retinopathy legally blind September , cataracts HTN, HLD, chronic systolic HF due to severe, EF 40% (11/14) s/p ICD Medtronic.   Started having problems with his heart in early 2013. Was followed by cardiologist at [facility]. Had cath in 2013 and arteries were "fine".  ECHO 05/05/13: EF 10%, diff HK, grade II DD, mild MR, LA mod dilated, RV mildly dilated and sys fx mod reduced, mod TR ECHO 11/04/13  EF 10% mod-severe MR RV mod reduced. TAPSE 1.6   Admitted to [facility] in January with N/V/D. EGD  Showed severe gastritis and esophagitis. Started on Milrinone 0.25 mcg via PICC. Discharge weight 133 pounds. Followed by [facility].   Gary Gallegos returns for follow up. Last visit Milrinone was cut back to 0.125 mcg. Denies SOB, PND, orthopnea or CP. Vision improving. Weight at home 145 pounds. Gary Gallegos continues on torsemide 60 mg in am. Appetite improved. Daughter takes care of his medications and Gary Gallegos takes daily. Following salt diet and drinking less than 2L a day. Followed [facility].   Optivol well above threshold with activity less than 1 hour per day.  Labs 07/15/13 K 5.7 Creatinine 1.95 instructed to cut lisinopril 5 mg daily.  Labs 07/22/13 K 3.6 Creatinine 0.81 Dig 1.9 digoxin stopped Labs  08/17/13 K 3.7 Creatinine 0.8 Glucose 190           08/20/13: K+ 3.7, Creatinine 0.70, Glucose 123, Na 137  SH: Lives with his daughter, son in law, and 2 granddaughters.     ROS: All systems negative except as listed in HPI, PMH and Problem List.  Past Medical History  Diagnosis Date  . CHF (congestive heart failure)   . Diabetes mellitus without complication   . Hypertension   . Impaired vision in both eyes     Current Outpatient Prescriptions  Medication  Sig Dispense Refill  . aspirin EC 81 MG tablet Take 81 mg by mouth daily.      . carvedilol (COREG) 3.125 MG tablet Take 1 tablet (3.125 mg total) by mouth 2 (two) times daily with a meal.  60 tablet  3  . glucose blood (ACCU-CHEK SMARTVIEW) test strip Use to check blood sugar once daily  100 each  12  . insulin glargine (LANTUS) 100 UNIT/ML injection Inject 0.05 mLs (5 Units total) into the skin at bedtime.  10 mL  11  . lisinopril (PRINIVIL,ZESTRIL) 5 MG tablet take 1 tablet by mouth once daily  30 tablet  3  . Magnesium Oxide 200 MG TABS Take 2 tablets (400 mg total) by mouth 2 (two) times daily.  120 tablet  3  . milrinone (PRIMACOR) 20 MG/100ML SOLN infusion Inject 7.75 mcg/min into the vein continuous.  100 mL    . ondansetron (ZOFRAN) 4 MG tablet Take 1 tablet (4 mg total) by mouth every 6 (six) hours as needed for nausea.  20 tablet  0  . pantoprazole (PROTONIX) 20 MG tablet Take 1 tablet (20 mg total) by mouth daily.  30 tablet  5  . potassium chloride SA (K-DUR,KLOR-CON) 20 MEQ tablet Take 1 tablet (20 mEq total) by mouth daily.  90 tablet  3  . sertraline (ZOLOFT) 50 MG tablet take 1 tablet  by mouth once daily  30 tablet  3  . simvastatin (ZOCOR) 5 MG tablet Take 5 mg by mouth at bedtime.      . torsemide (DEMADEX) 20 MG tablet Take 3 tablets (60 mg total) by mouth daily.  30 tablet  0  . zolpidem (AMBIEN) 5 MG tablet take 1 tablet by mouth at bedtime if needed  30 tablet  3   No current facility-administered medications for this encounter.    Filed Vitals:   11/04/13 1145  BP: 86/54  Pulse: 98  Weight: 146 lb (66.225 kg)  SpO2: 96%    PHYSICAL EXAM: General: Chronically ill appearing. No resp difficulty, daughter present. Ambulate in clinic with walker HEENT: normal; arcus senilus. Lenses don't appear clouded Neck: supple. JVP~8 . Carotids 2+ bilaterally; no bruits. No lymphadenopathy or thryomegaly appreciated. Cor: PMI laterally displaced.Tachycardic regulaNo rubs. + S3;  +3/6 systolic murmur  Lungs: clear Abdomen: soft, nontender, ++ distended. No hepatosplenomegaly. No bruits or masses. Good bowel sounds. Extremities: no cyanosis, clubbing, rash, edema; RUE picc Neuro: alert & orientedx3, cranial nerves grossly intact. Moves all 4 extremities w/o difficulty. Affect pleasant.    ASSESSMENT & PLAN:  1) Chronic combined systolic/diastolic biventricular HF: NICM, EF 10%, Diff HK, grade II DD, RV sys fx mod reduced (04/2013), Dr Gala RomneyBensimhon reviewed and discussed ECHO. EF 10% mod-severe MR RV mod reduced. TAPSE 1.6  Optivol well above threshold with activity less than 1 hour per day.  - NYHA II symptoms and volume status elevated. Will double torsemide back up to 60 mg twice a day for 2 days and potassium to 40 meq daily then Gary Gallegos will resume 60 mg daily and 20 meq of potassium daily.   Will need to increase milrinone back to 0.25 mcg.  - Continue coreg 3.125 mg BID. - Digoxin was stopped in the past Gary Gallegos was on 0.125 mg daily (dig level was 1.9 07/2013) - Continue lisinopril 5 mg daily. BM[facility] and weekly.  - Reinforced the need and importance of daily weights, a low sodium diet, and fluid restriction (less than 2 L a day). Instructed to call the HF clinic if weight increases more than 3 lbs overnight or 5 lbs in a week. 2)  Diabetes with diabetic retinopathy-  Had surgery on R eye last month and reports his vision is much improved and Gary Gallegos is supposed to be having eye surgery on the L next month. Discussed the need to continue follow blood sugars closely and to follow up with PCP.  3) Sinus Tachycardia- due to HF 4) HTN- Stable will continue current medications.  5) DM- Per PCP  Follow up in 3 weeks.   Amy D Clegg NP-C 11:58 AM  Patient seen and examined with Tonye BecketAmy Clegg, NP. We discussed all aspects of the encounter. I agree with the assessment and plan as stated above. Echo reviewed personally and still with severe biventricular dysfunctions. Seems  slightly worse since stopping milrinone. Weight is back up. Will increase milrinone back to 0.25 mcg/kg/min. Long talk about use of sliding scale diuretic regimen as above. Agree with changes as above. We once again discussed his poor prognosis and consideration of Goals of Care meeting. Gary Gallegos is not candidate for advanced therapies.   Total time spent 40 minutes. Over half that time spent discussing above.    Bevelyn Bucklesaniel R Bensimhon,MD 4:30 PM

## 2013-11-04 NOTE — Progress Notes (Signed)
  Echocardiogram 2D Echocardiogram has been performed.  Gary Gallegos 11/04/2013, 12:20 PM

## 2013-11-04 NOTE — Patient Instructions (Signed)
Follow up in 3 weeks  Take Torsemide 60 mg twice a day for 2 days then go back to 60 mg daily   Take Potassium 20 meq twice a day then go back to 20 meq daily   Do the following things EVERYDAY: 1) Weigh yourself in the morning before breakfast. Write it down and keep it in a log. 2) Take your medicines as prescribed 3) Eat low salt foods-Limit salt (sodium) to 2000 mg per day.  4) Stay as active as you can everyday 5) Limit all fluids for the day to less than 2 liters

## 2013-11-05 ENCOUNTER — Encounter: Payer: Self-pay | Admitting: Internal Medicine

## 2013-11-11 ENCOUNTER — Encounter: Payer: Self-pay | Admitting: Internal Medicine

## 2013-11-12 ENCOUNTER — Encounter: Payer: Self-pay | Admitting: Internal Medicine

## 2013-11-12 ENCOUNTER — Telehealth: Payer: Self-pay | Admitting: Licensed Clinical Social Worker

## 2013-11-12 NOTE — Telephone Encounter (Signed)
CSW referred to assist with goals of care. CSW contacted patient and daughter and scheduled visit in the clinic for June 8 following clinic appointment. Lasandra Beech, LCSW [phone]

## 2013-11-18 ENCOUNTER — Telehealth (HOSPITAL_COMMUNITY): Payer: Self-pay

## 2013-11-18 NOTE — Telephone Encounter (Signed)
Lab results reviewed with patient, instructed to take extra 20 meq potassium today with normal dose.  Aware and agreeable. Ave Filter

## 2013-11-19 ENCOUNTER — Encounter: Payer: Self-pay | Admitting: Internal Medicine

## 2013-11-23 ENCOUNTER — Ambulatory Visit (HOSPITAL_COMMUNITY)
Admission: RE | Admit: 2013-11-23 | Discharge: 2013-11-23 | Disposition: A | Discharge status: Home / Self Care | Payer: Medicare Other | Source: Ambulatory Visit | Attending: Internal Medicine | Admitting: Internal Medicine

## 2013-11-23 ENCOUNTER — Encounter: Payer: Self-pay | Admitting: Licensed Clinical Social Worker

## 2013-11-23 ENCOUNTER — Encounter (HOSPITAL_COMMUNITY): Payer: Self-pay

## 2013-11-23 VITALS — BP 86/62 | HR 120 | Wt 144.4 lb

## 2013-11-23 DIAGNOSIS — Z7982 Long term (current) use of aspirin: Secondary | ICD-10-CM | POA: Insufficient documentation

## 2013-11-23 DIAGNOSIS — Z794 Long term (current) use of insulin: Secondary | ICD-10-CM | POA: Insufficient documentation

## 2013-11-23 DIAGNOSIS — I498 Other specified cardiac arrhythmias: Secondary | ICD-10-CM | POA: Insufficient documentation

## 2013-11-23 DIAGNOSIS — E785 Hyperlipidemia, unspecified: Secondary | ICD-10-CM | POA: Insufficient documentation

## 2013-11-23 DIAGNOSIS — I509 Heart failure, unspecified: Secondary | ICD-10-CM | POA: Insufficient documentation

## 2013-11-23 DIAGNOSIS — I10 Essential (primary) hypertension: Secondary | ICD-10-CM | POA: Insufficient documentation

## 2013-11-23 DIAGNOSIS — Z79899 Other long term (current) drug therapy: Secondary | ICD-10-CM | POA: Insufficient documentation

## 2013-11-23 DIAGNOSIS — E1139 Type 2 diabetes mellitus with other diabetic ophthalmic complication: Secondary | ICD-10-CM | POA: Insufficient documentation

## 2013-11-23 DIAGNOSIS — H543 Unqualified visual loss, both eyes: Secondary | ICD-10-CM | POA: Insufficient documentation

## 2013-11-23 DIAGNOSIS — E11319 Type 2 diabetes mellitus with unspecified diabetic retinopathy without macular edema: Secondary | ICD-10-CM | POA: Insufficient documentation

## 2013-11-23 DIAGNOSIS — I5022 Chronic systolic (congestive) heart failure: Secondary | ICD-10-CM

## 2013-11-23 DIAGNOSIS — Z09 Encounter for follow-up examination after completed treatment for conditions other than malignant neoplasm: Secondary | ICD-10-CM | POA: Insufficient documentation

## 2013-11-23 DIAGNOSIS — E1136 Type 2 diabetes mellitus with diabetic cataract: Secondary | ICD-10-CM | POA: Insufficient documentation

## 2013-11-23 MED ORDER — POTASSIUM CHLORIDE CRYS ER 20 MEQ PO TBCR: 20.0000 meq | EXTENDED_RELEASE_TABLET | Freq: Two times a day (BID) | ORAL | Status: DC

## 2013-11-23 MED ORDER — TORSEMIDE 20 MG PO TABS: ORAL_TABLET | ORAL | Status: DC

## 2013-11-23 NOTE — Progress Notes (Signed)
CSW met with patient and daughter in the clinic. Patient reports that he is now living with his daughter and she has been a great support to him. CSW discussed goals of care and patient and daughter both report that they have had many conversations regarding goals of care. They shared a negative experience with patient's wife death who dies a few years back and want to avoid that from happening with patient. CSW provided information on Living Will and HPOA and patient and daughter will return to clinic tomorrow with completed form to have notarized. Patient verbalized understanding of discussion and plan for completion of documents. CSW will follow up with Department of Spiritual Care for assistance with notary. Raquel Sarna, [place]

## 2013-11-23 NOTE — Patient Instructions (Signed)
Increase your torsemide to 60 meq (3 tablets) in the morning and 40 meq (2 tablets) in the evening. If your weight is less than 139 lbs hold the afternoon dose.  Increase your potassium to 20 meq (1 tablet) two times a day.  Call any issues.  F/U 6 weeks  Do the following things EVERYDAY: 1) Weigh yourself in the morning before breakfast. Write it down and keep it in a log. 2) Take your medicines as prescribed 3) Eat low salt foods-Limit salt (sodium) to 2000 mg per day.  4) Stay as active as you can everyday 5) Limit all fluids for the day to less than 2 liters 6)

## 2013-11-23 NOTE — Addendum Note (Signed)
Encounter addended by: Dolores Patty, MD on: 11/23/2013 11:18 PM<BR>     Documentation filed: Charges VN, Problem List

## 2013-11-23 NOTE — Progress Notes (Addendum)
Patient ID: Gary Gallegos, male   DOB: Oct 30, 1945, 68 y.o.   MRN: [id number]  PCP: Dr. Adline Mango, FNP Primary Cardiologist: Dr. [name]  HPI: Gary Gallegos is a 68 yo male referred to the HF clinic by Dr.Nelson. He has a history of DM2 with severe diabetic retinopathy legally blind September , cataracts HTN, HLD, chronic systolic HF due to severe, EF 93% (11/14) s/p ICD Medtronic.   Started having problems with his heart in early 2013. Was followed by cardiologist at [facility]. Had cath in 2013 and arteries were "fine".  ECHO 05/05/13: EF 10%, diff HK, grade II DD, mild MR, LA mod dilated, RV mildly dilated and sys fx mod reduced, mod TR ECHO 11/04/13  EF 10% mod-severe MR RV mod reduced. TAPSE 1.6   Admitted to [facility] in January with N/V/D. EGD  Showed severe gastritis and esophagitis. Started on Milrinone 0.25 mcg via PICC. Discharge weight 133 pounds. Followed by [facility].   Follow up for Heart Failure: Last visit milrinone increased back to 0.25 mcg. Reports feeling good. Denies SOB, PND, orthopnea or CP. Weight at home 144-145 lbs. Has needed extra torsemide 60 mg (1 time).  Appetite improved. Daughter takes care of his medications and he takes daily. Following salt diet and drinking less, but drinking more than 2L a day. Followed [facility]. No Dizziness.   Optivol well above threshold with activity less than 1 hour per day.  Labs 07/15/13 K 5.7 Creatinine 1.95 instructed to cut lisinopril 5 mg daily.  Labs 07/22/13 K 3.6 Creatinine 0.81 Dig 1.9 digoxin stopped Labs  08/17/13 K 3.7 Creatinine 0.8 Glucose 190           08/20/13: K+ 3.7, Creatinine 0.70, Glucose 123, Na 137           10/22/13: K 3.9, creatinine 0.69, glucose 179  SH: Lives with his daughter, son in law, and 2 granddaughters.     ROS: All systems negative except as listed in HPI, PMH and Problem List.  Past Medical History  Diagnosis Date  . CHF (congestive heart failure)   . Diabetes mellitus without complication   .  Hypertension   . Impaired vision in both eyes     Current Outpatient Prescriptions  Medication Sig Dispense Refill  . aspirin EC 81 MG tablet Take 81 mg by mouth daily.      . carvedilol (COREG) 3.125 MG tablet Take 1 tablet (3.125 mg total) by mouth 2 (two) times daily with a meal.  60 tablet  3  . glucose blood (ACCU-CHEK SMARTVIEW) test strip Use to check blood sugar once daily  100 each  12  . insulin glargine (LANTUS) 100 UNIT/ML injection Inject 0.05 mLs (5 Units total) into the skin at bedtime.  10 mL  11  . lisinopril (PRINIVIL,ZESTRIL) 5 MG tablet take 1 tablet by mouth once daily  30 tablet  3  . Magnesium Oxide 200 MG TABS Take 2 tablets (400 mg total) by mouth 2 (two) times daily.  120 tablet  3  . milrinone (PRIMACOR) 20 MG/100ML SOLN infusion Inject 7.75 mcg/min into the vein continuous.  100 mL    . ondansetron (ZOFRAN) 4 MG tablet Take 1 tablet (4 mg total) by mouth every 6 (six) hours as needed for nausea.  20 tablet  0  . pantoprazole (PROTONIX) 20 MG tablet Take 1 tablet (20 mg total) by mouth daily.  30 tablet  5  . potassium chloride SA (K-DUR,KLOR-CON) 20 MEQ tablet Take  1 tablet (20 mEq total) by mouth 2 (two) times daily.  90 tablet  3  . sertraline (ZOLOFT) 50 MG tablet take 1 tablet by mouth once daily  30 tablet  3  . simvastatin (ZOCOR) 5 MG tablet Take 5 mg by mouth at bedtime.      . torsemide (DEMADEX) 20 MG tablet Take 3 tablets (60 mg total) by mouth 2 (two) times daily.  180 tablet  6  . zolpidem (AMBIEN) 5 MG tablet take 1 tablet by mouth at bedtime if needed  30 tablet  3   No current facility-administered medications for this encounter.    Filed Vitals:   11/23/13 1027  BP: 86/62  Pulse: 120  Weight: 144 lb 6.4 oz (65.499 kg)  SpO2: 100%    PHYSICAL EXAM: General: Chronically ill appearing. No resp difficulty, daughter present. Ambulate in clinic with walker HEENT: normal; arcus senilus. Lenses don't appear clouded Neck: supple. JVP~8 . Carotids  2+ bilaterally; no bruits. No lymphadenopathy or thryomegaly appreciated. Cor: PMI laterally displaced.No rubs. + S3; +3/6 systolic murmur  Lungs: clear Abdomen: soft, nontender, mildly distended. No hepatosplenomegaly. No bruits or masses. Good bowel sounds. Extremities: no cyanosis, clubbing, rash, edema; RUE picc Neuro: alert & orientedx3, cranial nerves grossly intact. Moves all 4 extremities w/o difficulty. Affect pleasant.    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 EF 10% mod-severe MR RV mod reduced. TAPSE 1.6 (10/2013) - Optivol well above threshold with activity less than 1 hour per day.  - NYHA II-III symptoms on milrinone. Volume status elevated. Increase torsemide to 60 mg q am and 40 mg q pm. Instructed to hold afternoon torsemide if home weight less than 139 lbs. Increase potassium to 20 meq BID. - Continue coreg 3.125 mg BID. - Continue lisinopril 5 mg daily may need to stop if SBP becomes any softer.  - [facility]. Will get dig level next time.  - Reinforced the need and importance of daily weights, a low sodium diet, and fluid restriction (less than 2 L a day). Instructed to call the HF clinic if weight increases more than 3 lbs overnight or 5 lbs in a week. 2)  Diabetes with diabetic retinopathy-  Had surgery on R eye and vision is much improved. Not able to do L eye surgery currently because heart too weak. Discussed the need to continue follow blood sugars closely and to follow up with PCP.  3) Sinus Tachycardia- due to HF 4) HTN- Stable will continue current medications.  5) DM- Per PCP  F/U 6 weeks [name] NP-C 10:37 AM  Patient seen and examined with Ulla PotashAli Lis Savitt, NP. We discussed all aspects of the encounter. I agree with the assessment and plan as stated above.  He is improved with milrinone. Volume status mildly elevated on exam and by Optivol. Activity level low;  no VT on ICD interrogation in Clinic. Agree with increasing diuretics. Follow renal function.   Bevelyn BucklesDaniel R Bensimhon,MD 11:08 PM  Addendum: [facility] and was 0.2. (11/26/13)

## 2013-11-24 ENCOUNTER — Encounter: Payer: Self-pay | Admitting: Licensed Clinical Social Worker

## 2013-11-24 NOTE — Progress Notes (Signed)
CSW met with patient and daughter. Patient brought in completed Living Will and HPOA. Notary signed and completed documents. CSW will provide copy to Medical records and gave patient copies as well as original to keep at home. Patient verbalizes understanding and states he is relived to have the documents completed. CSW continues to follow for support as needed. Raquel Sarna, [place]

## 2013-11-26 ENCOUNTER — Encounter: Payer: Self-pay | Admitting: Family

## 2013-11-26 ENCOUNTER — Ambulatory Visit (INDEPENDENT_AMBULATORY_CARE_PROVIDER_SITE_OTHER): Payer: Medicare Other | Admitting: Family

## 2013-11-26 VITALS — BP 92/64 | HR 111 | Ht 69.0 in | Wt 146.4 lb

## 2013-11-26 DIAGNOSIS — E876 Hypokalemia: Secondary | ICD-10-CM

## 2013-11-26 DIAGNOSIS — G4700 Insomnia, unspecified: Secondary | ICD-10-CM

## 2013-11-26 DIAGNOSIS — IMO0001 Reserved for inherently not codable concepts without codable children: Secondary | ICD-10-CM

## 2013-11-26 DIAGNOSIS — E1165 Type 2 diabetes mellitus with hyperglycemia: Principal | ICD-10-CM

## 2013-11-26 DIAGNOSIS — E7800 Pure hypercholesterolemia, unspecified: Secondary | ICD-10-CM

## 2013-11-26 DIAGNOSIS — I10 Essential (primary) hypertension: Secondary | ICD-10-CM

## 2013-11-26 DIAGNOSIS — H6120 Impacted cerumen, unspecified ear: Secondary | ICD-10-CM

## 2013-11-26 LAB — CBC WITH DIFFERENTIAL/PLATELET
BASOS PCT: 0.8 % (ref 0.0–3.0)
Basophils Absolute: 0.1 10*3/uL (ref 0.0–0.1)
EOS PCT: 3 % (ref 0.0–5.0)
Eosinophils Absolute: 0.2 10*3/uL (ref 0.0–0.7)
HCT: 38.9 % — ABNORMAL LOW (ref 39.0–52.0)
Hemoglobin: 12.6 g/dL — ABNORMAL LOW (ref 13.0–17.0)
Lymphocytes Relative: 13.3 % (ref 12.0–46.0)
Lymphs Abs: 0.9 10*3/uL (ref 0.7–4.0)
MCHC: 32.3 g/dL (ref 30.0–36.0)
MCV: 95.7 fl (ref 78.0–100.0)
MONOS PCT: 8.2 % (ref 3.0–12.0)
Monocytes Absolute: 0.6 10*3/uL (ref 0.1–1.0)
Neutro Abs: 5 10*3/uL (ref 1.4–7.7)
Neutrophils Relative %: 74.7 % (ref 43.0–77.0)
Platelets: 171 10*3/uL (ref 150.0–400.0)
RBC: 4.07 Mil/uL — ABNORMAL LOW (ref 4.22–5.81)
RDW: 13.9 % (ref 11.5–15.5)
WBC: 6.7 10*3/uL (ref 4.0–10.5)

## 2013-11-26 LAB — BASIC METABOLIC PANEL
BUN: 18 mg/dL (ref 6–23)
CO2: 30 mEq/L (ref 19–32)
CREATININE: 1 mg/dL (ref 0.4–1.5)
Calcium: 9.1 mg/dL (ref 8.4–10.5)
Chloride: 101 mEq/L (ref 96–112)
GFR: 97.16 mL/min (ref >60.00)
GLUCOSE: 285 mg/dL — AB (ref 70–99)
Potassium: 3.6 mEq/L (ref 3.5–5.1)
Sodium: 139 mEq/L (ref 135–145)

## 2013-11-26 LAB — HEPATIC FUNCTION PANEL
ALBUMIN: 4 g/dL (ref 3.5–5.2)
ALT: 11 U/L (ref 0–53)
AST: 21 U/L (ref 0–37)
Alkaline Phosphatase: 101 U/L (ref 39–117)
BILIRUBIN TOTAL: 0.7 mg/dL (ref 0.2–1.2)
Bilirubin, Direct: 0.2 mg/dL (ref 0.0–0.3)
Total Protein: 7.6 g/dL (ref 6.0–8.3)

## 2013-11-26 LAB — LIPID PANEL
CHOL/HDL RATIO: 3
Cholesterol: 100 mg/dL (ref 0–200)
HDL: 31.3 mg/dL — AB (ref >39.00)
LDL Cholesterol: 41 mg/dL (ref 0–99)
NonHDL: 68.7
Triglycerides: 138 mg/dL (ref 0.0–149.0)
VLDL: 27.6 mg/dL (ref 0.0–40.0)

## 2013-11-26 LAB — HEMOGLOBIN A1C: HEMOGLOBIN A1C: 8.4 % — AB (ref 4.6–6.5)

## 2013-11-26 MED ORDER — MIRTAZAPINE 7.5 MG PO TABS: 7.5000 mg | ORAL_TABLET | Freq: Every day | ORAL | Status: DC

## 2013-11-26 NOTE — Progress Notes (Signed)
Pre visit review using our clinic review tool, if applicable. No additional management support is needed unless otherwise documented below in the visit note. 

## 2013-11-26 NOTE — Patient Instructions (Signed)

## 2013-11-26 NOTE — Progress Notes (Signed)
Subjective:    Patient ID: Gary Gallegos, male    DOB: April 17, 1946, 68 y.o.   MRN: [id number]  HPI 68 year old [place] male, with a history of type 2 diabetes, hypertension, hyperlipidemia, cirrhosis of the liver, and insomnia is in today for recheck. Currently stable on medications. He is under the care of cardiology. Intravenously gets Primacor through a PICC line in his right arm. Has concerns of difficulty maintaining sleep. No difficulty following asleep. Issue ongoing x2 months. No urinary frequency or urgency.   Review of Systems  Constitutional: Negative.   HENT: Negative.   Respiratory: Negative.   Cardiovascular: Negative.   Gastrointestinal: Negative.   Endocrine: Negative.   Genitourinary: Negative.   Musculoskeletal: Negative.   Skin: Negative.   Allergic/Immunologic: Negative.   Neurological: Negative.   Hematological: Negative.   Psychiatric/Behavioral: Negative.    Past Medical History  Diagnosis Date  . CHF (congestive heart failure)   . Diabetes mellitus without complication   . Hypertension   . Impaired vision in both eyes     History   Social History  . Marital Status: Widowed    Spouse Name: N/A    Number of Children: N/A  . Years of Education: N/A   Occupational History  . Not on file.   Social History Main Topics  . Smoking status: Never Smoker   . Smokeless tobacco: Not on file  . Alcohol Use: No  . Drug Use: No  . Sexual Activity: Not on file   Other Topics Concern  . Not on file   Social History Narrative  . No narrative on file    Past Surgical History  Procedure Laterality Date  . Cardiac defibrillator placement    . Esophagogastroduodenoscopy N/A 07/09/2013    Procedure: ESOPHAGOGASTRODUODENOSCOPY (EGD);  Surgeon: Hart Carwinora M Brodie, MD;  [facility];  Service: Endoscopy;  Laterality: N/A;    Family History  Problem Relation Age of Onset  . Heart disease Father     Allergies  Allergen Reactions  .  Penicillins Nausea And Vomiting  . Sulfa Antibiotics Nausea And Vomiting    Current Outpatient Prescriptions on File Prior to Visit  Medication Sig Dispense Refill  . aspirin EC 81 MG tablet Take 81 mg by mouth daily.      . carvedilol (COREG) 3.125 MG tablet Take 1 tablet (3.125 mg total) by mouth 2 (two) times daily with a meal.  60 tablet  3  . glucose blood (ACCU-CHEK SMARTVIEW) test strip Use to check blood sugar once daily  100 each  12  . insulin glargine (LANTUS) 100 UNIT/ML injection Inject 0.05 mLs (5 Units total) into the skin at bedtime.  10 mL  11  . lisinopril (PRINIVIL,ZESTRIL) 5 MG tablet take 1 tablet by mouth once daily  30 tablet  3  . Magnesium Oxide 200 MG TABS Take 2 tablets (400 mg total) by mouth 2 (two) times daily.  120 tablet  3  . milrinone (PRIMACOR) 20 MG/100ML SOLN infusion Inject 7.75 mcg/min into the vein continuous.  100 mL    . ondansetron (ZOFRAN) 4 MG tablet Take 1 tablet (4 mg total) by mouth every 6 (six) hours as needed for nausea.  20 tablet  0  . pantoprazole (PROTONIX) 20 MG tablet Take 1 tablet (20 mg total) by mouth daily.  30 tablet  5  . potassium chloride SA (K-DUR,KLOR-CON) 20 MEQ tablet Take 1 tablet (20 mEq total) by mouth 2 (two) times daily.  60 tablet  3  . sertraline (ZOLOFT) 50 MG tablet take 1 tablet by mouth once daily  30 tablet  3  . simvastatin (ZOCOR) 5 MG tablet Take 5 mg by mouth at bedtime.      . torsemide (DEMADEX) 20 MG tablet Take 60 mg (3 tablets) in the am and 40 gm (2 tablets) in the pm. If weight less than 139 lbs hold afternoon dose.  150 tablet  6   No current facility-administered medications on file prior to visit.    BP 92/64  Pulse 111  Ht 5\' 9"  (1.753 m)  Wt 146 lb 6.4 oz (66.407 kg)  BMI 21.61 kg/m2  SpO2 97%chart    Objective:   Physical Exam  Constitutional: He is oriented to person, place, and time. He appears well-developed and well-nourished.  HENT:  Nose: Nose normal.  Mouth/Throat: Oropharynx is  clear and moist.  Cerumen impaction  Neck: Normal range of motion. Neck supple.  Cardiovascular: Normal rate, regular rhythm and normal heart sounds.   Pulmonary/Chest: Effort normal and breath sounds normal.  Abdominal: Soft. Bowel sounds are normal.  Musculoskeletal: Normal range of motion.  Ambulates with walker  Neurological: He is alert and oriented to person, place, and time.  Skin: Skin is warm and dry.  Psychiatric: He has a normal mood and affect.          Assessment & Plan:   Problem List Items Addressed This Visit   None    Visit Diagnoses   Type II or unspecified type diabetes mellitus without mention of complication, uncontrolled    -  Primary    Relevant Orders       Hemoglobin A1c       Hepatic Function Panel    Hypokalemia        Unspecified essential hypertension        Relevant Orders       CBC with Differential       Basic Metabolic Panel       Hepatic Function Panel    Pure hypercholesterolemia        Relevant Orders       Lipid Panel       Hepatic Function Panel    Insomnia        Relevant Orders       CBC with Differential    Cerumen impaction          discussed the possibility of a colonoscopy but due to his current health state, I do not think a colonoscopy is a good idea. Continue current medications. Labs sent. Will bring patient back for recheck in 3 months.

## 2013-11-27 ENCOUNTER — Telehealth: Payer: Self-pay | Admitting: Family

## 2013-11-27 NOTE — Telephone Encounter (Signed)
Relevant patient education assigned to patient using Emmi. ° °

## 2013-12-01 NOTE — Addendum Note (Signed)
Encounter addended by: Aundria Rud, NP on: 12/01/2013  1:19 PM<BR>     Documentation filed: Notes Section

## 2013-12-03 ENCOUNTER — Encounter: Payer: Self-pay | Admitting: Internal Medicine

## 2013-12-03 ENCOUNTER — Encounter: Payer: Self-pay | Admitting: Anesthesiology

## 2013-12-09 ENCOUNTER — Telehealth: Payer: Self-pay | Admitting: Family

## 2013-12-09 NOTE — Telephone Encounter (Signed)
[facility] is needing a1c results 90 days prior or after January.

## 2013-12-10 ENCOUNTER — Encounter: Payer: Self-pay | Admitting: Internal Medicine

## 2013-12-10 ENCOUNTER — Other Ambulatory Visit: Payer: Self-pay | Admitting: Family

## 2013-12-10 NOTE — Telephone Encounter (Signed)
Gary Gallegos was provided with what she needed already

## 2013-12-17 ENCOUNTER — Telehealth (HOSPITAL_COMMUNITY): Payer: Self-pay

## 2013-12-17 LAB — CBC WITH DIFFERENTIAL
BASOS: 0 %
Basophils Absolute: 0 10*3/uL (ref 0.0–0.2)
EOS: 2 %
Eosinophils Absolute: 0.1 10*3/uL (ref 0.0–0.4)
HCT: 41.5 % (ref 37.5–51.0)
HEMOGLOBIN: 14.6 g/dL (ref 12.6–17.7)
LYMPHS ABS: 1.1 10*3/uL (ref 0.7–3.1)
Lymphs: 20 %
MCH: 30.7 pg (ref 26.6–33.0)
MCHC: 35.2 g/dL (ref 31.5–35.7)
MCV: 87 fL (ref 79–97)
MONOCYTES: 12 %
Monocytes Absolute: 0.7 10*3/uL (ref 0.1–0.9)
Neutrophils Absolute: 3.7 10*3/uL (ref 1.4–7.0)
Neutrophils Relative %: 66 %
Platelets: 188 10*3/uL (ref 150–379)
RBC: 4.76 x10E6/uL (ref 4.14–5.80)
RDW: 14.6 % (ref 12.3–15.4)
WBC: 5.6 10*3/uL (ref 3.4–10.8)

## 2013-12-17 LAB — PROTIME-INR
INR: 1.2 (ref 0.8–1.2)
Prothrombin Time: 12.3 s — ABNORMAL HIGH (ref 9.1–12.0)

## 2013-12-17 LAB — COMPREHENSIVE METABOLIC PANEL
A/G RATIO: 1.1 (ref 1.1–2.5)
ALT: 9 IU/L (ref 0–44)
AST: 18 IU/L (ref 0–40)
Albumin: 3.6 g/dL (ref 3.6–4.8)
Alkaline Phosphatase: 127 IU/L — ABNORMAL HIGH (ref 39–117)
BILIRUBIN TOTAL: 0.8 mg/dL (ref 0.0–1.2)
BUN / CREAT RATIO: 19 (ref 10–22)
BUN: 13 mg/dL (ref 8–27)
CO2: 31 mmol/L — ABNORMAL HIGH (ref 18–29)
Calcium: 9.2 mg/dL (ref 8.6–10.2)
Chloride: 100 mmol/L (ref 96–108)
Creatinine, Ser: 0.7 mg/dL — ABNORMAL LOW (ref 0.76–1.27)
GFR calc non Af Amer: 98 mL/min/{1.73_m2} (ref >59)
GFR, EST AFRICAN AMERICAN: 114 mL/min/{1.73_m2} (ref >59)
Globulin, Total: 3.4 g/dL (ref 1.5–4.5)
Glucose: 123 mg/dL — ABNORMAL HIGH (ref 65–99)
POTASSIUM: 3.7 mmol/L (ref 3.5–5.2)
Sodium: 137 mmol/L (ref 134–144)
TOTAL PROTEIN: 7 g/dL (ref 6.0–8.5)

## 2013-12-17 LAB — MAGNESIUM: Magnesium: 1.7 mg/dL (ref 1.6–2.6)

## 2013-12-17 NOTE — Telephone Encounter (Signed)
Lab results reviewed with patient.  Instructed to follow up with PCP regarding elevated glucose level.  Aware and agreeable.  Lab results faxed to PCP for reference. [facility]

## 2013-12-23 ENCOUNTER — Encounter: Payer: Self-pay | Admitting: Family

## 2013-12-23 ENCOUNTER — Other Ambulatory Visit: Payer: Self-pay | Admitting: Family

## 2013-12-23 ENCOUNTER — Other Ambulatory Visit: Payer: Self-pay

## 2013-12-23 MED ORDER — INSULIN GLARGINE 100 UNIT/ML ~~LOC~~ SOLN: 5.0000 [IU] | Freq: Every day | SUBCUTANEOUS | Status: DC

## 2013-12-23 MED ORDER — GLUCOSE BLOOD VI STRP: ORAL_STRIP | Status: DC

## 2013-12-29 ENCOUNTER — Encounter: Payer: Self-pay | Admitting: Family

## 2013-12-31 ENCOUNTER — Telehealth (HOSPITAL_COMMUNITY): Payer: Self-pay

## 2013-12-31 NOTE — Telephone Encounter (Signed)
Glucose elevated, pt aware, forwarded results to PCP dr. Adline Mango

## 2014-01-01 ENCOUNTER — Encounter: Payer: Self-pay | Admitting: Internal Medicine

## 2014-01-04 NOTE — Progress Notes (Addendum)
Patient ID: Gary Gallegos, male   DOB: 08-08-1945, 68 y.o.   MRN: [id number]  PCP: Dr. Adline MangoPadonda Campbell, FNP Primary Cardiologist: Dr. Andreas OhmKatrina Nelson  HPI: Gary Gallegos is a 68 yo male referred to the HF clinic by Dr.Nelson. He has a history of DM2 with severe diabetic retinopathy legally blind September , cataracts HTN, HLD, chronic systolic HF due to severe, EF 78%10% (11/14) s/p ICD Medtronic.   Started having problems with his heart in early 2013. [facility]. Had cath in 2013 and arteries were "fine".  ECHO 05/05/13: EF 10%, diff HK, grade II DD, mild MR, LA mod dilated, RV mildly dilated and sys fx mod reduced, mod TR ECHO 11/04/13  EF 10% mod-severe MR RV mod reduced. TAPSE 1.6   [facility]V/D. EGD  Showed severe gastritis and esophagitis. Started on Milrinone 0.25 mcg via PICC. Discharge weight 133 pounds. [facility]   Follow up for Heart Failure: Last visit torsemide was increased to 60 mg in am and 40 mg in pm with instructions to hold if weight less  than 139 pounds. Coughing in at night. + Orthopnea sleeps on 2 pillows. Denies Orthopnea. Denies dizziness. He reamins on Milrinone 0.25 mcg. Weight at home 147 pounds. Over the July 4th weekend he missed some doses of torsemide. Excellent appetite.  Following salt diet and drinking less, but drinking more than 2L a day. Followed [facility]. No Dizziness.   Optivol  down well below threshold  with activity less than 1 hour per day.  Labs 07/15/13 K 5.7 Creatinine 1.95 instructed to cut lisinopril 5 mg daily.  Labs 07/22/13 K 3.6 Creatinine 0.81 Dig 1.9 digoxin stopped Labs  08/17/13 K 3.7 Creatinine 0.8 Glucose 190           08/20/13: K+ 3.7, Creatinine 0.70, Glucose 123, Na 137           10/22/13: K 3.9, creatinine 0.69, glucose 179  SH: Lives with his daughter, son in law, and 2 granddaughters.     ROS: All systems negative except as listed in HPI, PMH and Problem List.  Past  Medical History  Diagnosis Date  . CHF (congestive heart failure)   . Diabetes mellitus without complication   . Hypertension   . Impaired vision in both eyes     Current Outpatient Prescriptions  Medication Sig Dispense Refill  . aspirin EC 81 MG tablet Take 81 mg by mouth daily.      . carvedilol (COREG) 3.125 MG tablet Take 1 tablet (3.125 mg total) by mouth 2 (two) times daily with a meal.  60 tablet  3  . glucose blood (ACCU-CHEK SMARTVIEW) test strip Use to check blood sugar once daily  100 each  12  . insulin glargine (LANTUS) 100 UNIT/ML injection Inject 0.05 mLs (5 Units total) into the skin at bedtime.  10 mL  11  . lisinopril (PRINIVIL,ZESTRIL) 5 MG tablet take 1 tablet by mouth once daily  30 tablet  3  . Magnesium Oxide 200 MG TABS Take 2 tablets (400 mg total) by mouth 2 (two) times daily.  120 tablet  3  . milrinone (PRIMACOR) 20 MG/100ML SOLN infusion Inject 7.75 mcg/min into the vein continuous.  100 mL    . mirtazapine (REMERON) 7.5 MG tablet Take 1 tablet (7.5 mg total) by mouth at bedtime.  30 tablet  3  . ondansetron (ZOFRAN) 4 MG tablet Take 1 tablet (4 mg  total) by mouth every 6 (six) hours as needed for nausea.  20 tablet  0  . pantoprazole (PROTONIX) 20 MG tablet take 1 tablet by mouth once daily  30 tablet  5  . potassium chloride SA (K-DUR,KLOR-CON) 20 MEQ tablet Take 1 tablet (20 mEq total) by mouth 2 (two) times daily.  60 tablet  3  . sertraline (ZOLOFT) 50 MG tablet take 1 tablet by mouth once daily  90 tablet  3  . simvastatin (ZOCOR) 5 MG tablet Take 5 mg by mouth at bedtime.      . torsemide (DEMADEX) 20 MG tablet Take 60 mg (3 tablets) in the am and 40 gm (2 tablets) in the pm. If weight less than 139 lbs hold afternoon dose.  150 tablet  6   No current facility-administered medications for this encounter.    Filed Vitals:   01/05/14 1050  BP: 99/69  Pulse: 116  Resp: 18  Weight: 149 lb 4 oz (67.699 kg)  SpO2: 100%    PHYSICAL EXAM: General:  Chronically ill appearing. No resp difficulty, daughter present.  HEENT: normal; arcus senilus. Lenses don't appear clouded Neck: supple. JVP~5-6 . Carotids 2+ bilaterally; no bruits. No lymphadenopathy or thryomegaly appreciated. Cor: PMI laterally displaced.No rubs. + S3; +3/6 systolic murmur  Lungs: clear Abdomen: soft, nontender, non distended. No hepatosplenomegaly. No bruits or masses. Good bowel sounds. Extremities: no cyanosis, clubbing, rash, edema; RUE picc Neuro: alert & orientedx3, cranial nerves grossly intact. Moves all 4 extremities w/o difficulty. Affect pleasant.    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 EF 10% mod-severe MR RV mod reduced. TAPSE 1.6 (10/2013) - Optivol well below threshold with activity around 1 hour per day.  - NYHA II-III symptoms on milrinone. Volume status stable despite weight gain. Continue  torsemide to 60 mg q am and 40 mg q pm. Instructed to hold afternoon torsemide if home weight less than 139 lbs. Increase potassium to 20 meq BID. - Continue coreg 3.125 mg BID. - Continue lisinopril 5 mg daily will not titrate due to soft BP.   - BMET per  [facility] and weekly on Thursday  - Reinforced the need and importance of daily weights, a low sodium diet, and fluid restriction (less than 2 L a day). Instructed to call the HF clinic if weight increases more than 3 lbs overnight or 5 lbs in a week. 2)  Diabetes with diabetic retinopathy-  Had surgery on R eye and vision is much improved. Not able to do L eye surgery currently because heart too weak. Discussed the need to continue follow blood sugars closely and to follow up with PCP.  3) Sinus Tachycardia- due to HF 4) HTN- Stable will continue current medications.  5) DM- Per PCP. Improving better controlled   Follow up in 6 weeks.  CLEGG,[name] 11:04 AM

## 2014-01-05 ENCOUNTER — Ambulatory Visit (HOSPITAL_COMMUNITY)
Admission: RE | Admit: 2014-01-05 | Discharge: 2014-01-05 | Disposition: A | Discharge status: Home / Self Care | Payer: Medicare Other | Source: Ambulatory Visit | Attending: Cardiology | Admitting: Cardiology

## 2014-01-05 ENCOUNTER — Encounter (HOSPITAL_COMMUNITY): Payer: Self-pay

## 2014-01-05 VITALS — BP 99/69 | HR 116 | Resp 18 | Wt 149.2 lb

## 2014-01-05 DIAGNOSIS — I10 Essential (primary) hypertension: Secondary | ICD-10-CM | POA: Diagnosis not present

## 2014-01-05 DIAGNOSIS — I5042 Chronic combined systolic (congestive) and diastolic (congestive) heart failure: Secondary | ICD-10-CM | POA: Diagnosis present

## 2014-01-05 DIAGNOSIS — I509 Heart failure, unspecified: Secondary | ICD-10-CM | POA: Diagnosis not present

## 2014-01-05 DIAGNOSIS — Z95810 Presence of automatic (implantable) cardiac defibrillator: Secondary | ICD-10-CM | POA: Insufficient documentation

## 2014-01-05 DIAGNOSIS — Z7982 Long term (current) use of aspirin: Secondary | ICD-10-CM | POA: Diagnosis not present

## 2014-01-05 DIAGNOSIS — E11319 Type 2 diabetes mellitus with unspecified diabetic retinopathy without macular edema: Secondary | ICD-10-CM | POA: Diagnosis not present

## 2014-01-05 DIAGNOSIS — Z794 Long term (current) use of insulin: Secondary | ICD-10-CM | POA: Insufficient documentation

## 2014-01-05 DIAGNOSIS — I498 Other specified cardiac arrhythmias: Secondary | ICD-10-CM | POA: Diagnosis not present

## 2014-01-05 DIAGNOSIS — E1139 Type 2 diabetes mellitus with other diabetic ophthalmic complication: Secondary | ICD-10-CM | POA: Insufficient documentation

## 2014-01-05 DIAGNOSIS — I5022 Chronic systolic (congestive) heart failure: Secondary | ICD-10-CM

## 2014-01-05 DIAGNOSIS — H548 Legal blindness, as defined in USA: Secondary | ICD-10-CM | POA: Insufficient documentation

## 2014-01-05 NOTE — Patient Instructions (Signed)
Follow up in 6 weeks  Do the following things EVERYDAY: 1) Weigh yourself in the morning before breakfast. Write it down and keep it in a log. 2) Take your medicines as prescribed 3) Eat low salt foods-Limit salt (sodium) to 2000 mg per day.  4) Stay as active as you can everyday 5) Limit all fluids for the day to less than 2 liters 

## 2014-01-07 DIAGNOSIS — E1165 Type 2 diabetes mellitus with hyperglycemia: Secondary | ICD-10-CM

## 2014-01-07 DIAGNOSIS — I5022 Chronic systolic (congestive) heart failure: Secondary | ICD-10-CM

## 2014-01-07 DIAGNOSIS — I509 Heart failure, unspecified: Secondary | ICD-10-CM

## 2014-01-07 DIAGNOSIS — Z452 Encounter for adjustment and management of vascular access device: Secondary | ICD-10-CM

## 2014-01-07 DIAGNOSIS — IMO0001 Reserved for inherently not codable concepts without codable children: Secondary | ICD-10-CM

## 2014-01-12 ENCOUNTER — Ambulatory Visit (INDEPENDENT_AMBULATORY_CARE_PROVIDER_SITE_OTHER): Payer: Medicare Other

## 2014-01-12 DIAGNOSIS — M79609 Pain in unspecified limb: Secondary | ICD-10-CM

## 2014-01-12 DIAGNOSIS — E1149 Type 2 diabetes mellitus with other diabetic neurological complication: Secondary | ICD-10-CM

## 2014-01-12 DIAGNOSIS — B351 Tinea unguium: Secondary | ICD-10-CM

## 2014-01-12 DIAGNOSIS — M79606 Pain in leg, unspecified: Secondary | ICD-10-CM

## 2014-01-12 DIAGNOSIS — E1142 Type 2 diabetes mellitus with diabetic polyneuropathy: Secondary | ICD-10-CM

## 2014-01-12 DIAGNOSIS — E1140 Type 2 diabetes mellitus with diabetic neuropathy, unspecified: Secondary | ICD-10-CM

## 2014-01-12 NOTE — Progress Notes (Signed)
   Subjective:    Patient ID: Gary Gallegos, male    DOB: 07/04/1945, 68 y.o.   MRN: [id number]  HPI Comments: Pt presents for debridement of 10 toenails.     Review of Systems no new findings or systemic changes noted     Objective:   Physical Exam Lower the objective findings as follows pedal pulses palpable DP plus one over 4 PT +2/4 bilateral Very refill time 3 seconds all digits epicritic sensation diminished on Semmes Weinstein testing to forefoot and arch there is normal plantar response DTRs not elicited neurologically skin color pigment normal hair growth absent nails thick brittle crumbly friable discolored 1 through 5 bilateral. No open wounds no secondary infection is noted       Assessment & Plan:  Assessment this time his diabetes with history peripheral neuropathy dystrophic trouble mycotic nails 1 through 5 bilateral debridement at this time return for future palliative care in 3 months for an as-needed basis for followup recommend Fungi-Nail application daily for fungus nail infection.  Alvan Dame DPM

## 2014-01-12 NOTE — Patient Instructions (Addendum)
Diabetes and Foot Care Diabetes may cause you to have problems because of poor blood supply (circulation) to your feet and legs. This may cause the skin on your feet to become thinner, break easier, and heal more slowly. Your skin may become dry, and the skin may peel and crack. You may also have nerve damage in your legs and feet causing decreased feeling in them. You may not notice minor injuries to your feet that could lead to infections or more serious problems. Taking care of your feet is one of the most important things you can do for yourself.  HOME CARE INSTRUCTIONS  Wear shoes at all times, even in the house. Do not go barefoot. Bare feet are easily injured.  Check your feet daily for blisters, cuts, and redness. If you cannot see the bottom of your feet, use a mirror or ask someone for help.  Wash your feet with warm water (do not use hot water) and mild soap. Then pat your feet and the areas between your toes until they are completely dry. Do not soak your feet as this can dry your skin.  Apply a moisturizing lotion or petroleum jelly (that does not contain alcohol and is unscented) to the skin on your feet and to dry, brittle toenails. Do not apply lotion between your toes.  Trim your toenails straight across. Do not dig under them or around the cuticle. File the edges of your nails with an emery board or nail file.  Do not cut corns or calluses or try to remove them with medicine.  Wear clean socks or stockings every day. Make sure they are not too tight. Do not wear knee-high stockings since they may decrease blood flow to your legs.  Wear shoes that fit properly and have enough cushioning. To break in new shoes, wear them for just a few hours a day. This prevents you from injuring your feet. Always look in your shoes before you put them on to be sure there are no objects inside.  Do not cross your legs. This may decrease the blood flow to your feet.  If you find a minor scrape,  cut, or break in the skin on your feet, keep it and the skin around it clean and dry. These areas may be cleansed with mild soap and water. Do not cleanse the area with peroxide, alcohol, or iodine.  When you remove an adhesive bandage, be sure not to damage the skin around it.  If you have a wound, look at it several times a day to make sure it is healing.  Do not use heating pads or hot water bottles. They may burn your skin. If you have lost feeling in your feet or legs, you may not know it is happening until it is too late.  Make sure your health care provider performs a complete foot exam at least annually or more often if you have foot problems. Report any cuts, sores, or bruises to your health care provider immediately. SEEK MEDICAL CARE IF:   You have an injury that is not healing.  You have cuts or breaks in the skin.  You have an ingrown nail.  You notice redness on your legs or feet.  You feel burning or tingling in your legs or feet.  You have pain or cramps in your legs and feet.  Your legs or feet are numb.  Your feet always feel cold. SEEK IMMEDIATE MEDICAL CARE IF:   There is increasing redness,   swelling, or pain in or around a wound.  There is a red line that goes up your leg.  Pus is coming from a wound.  You develop a fever or as directed by your health care provider.  You notice a bad smell coming from an ulcer or wound. Document Released: 06/01/2000 Document Revised: 02/04/2013 Document Reviewed: 11/11/2012 [facility] Patient Information 2015 [place], [place]. This information is not intended to replace advice given to you by your health care provider. Make sure you discuss any questions you have with your health care provider.     Recommendations for fungal nail infection. Can't obtain Fungi-Nail at any drugstore pharmacy. Apply Fungi-Nail to each affected toenail once or twice daily for 12 months minimum. Discontinue any skin irritation occurs maintain  treatment for a minimum of 12 months or until resolution

## 2014-01-18 ENCOUNTER — Encounter: Payer: Self-pay | Admitting: Internal Medicine

## 2014-01-19 ENCOUNTER — Encounter: Payer: Self-pay | Admitting: Internal Medicine

## 2014-01-21 LAB — PROTIME-INR

## 2014-02-08 ENCOUNTER — Emergency Department (HOSPITAL_COMMUNITY)
Admission: EM | Admit: 2014-02-08 | Discharge: 2014-02-08 | Disposition: A | Discharge status: Home / Self Care | Payer: Medicare Other | Attending: Emergency Medicine | Admitting: Emergency Medicine

## 2014-02-08 ENCOUNTER — Encounter (HOSPITAL_COMMUNITY): Payer: Self-pay | Admitting: Emergency Medicine

## 2014-02-08 ENCOUNTER — Emergency Department (HOSPITAL_COMMUNITY): Payer: Medicare Other

## 2014-02-08 DIAGNOSIS — H543 Unqualified visual loss, both eyes: Secondary | ICD-10-CM | POA: Diagnosis not present

## 2014-02-08 DIAGNOSIS — Z95828 Presence of other vascular implants and grafts: Secondary | ICD-10-CM

## 2014-02-08 DIAGNOSIS — T82598A Other mechanical complication of other cardiac and vascular devices and implants, initial encounter: Secondary | ICD-10-CM | POA: Diagnosis present

## 2014-02-08 DIAGNOSIS — Z95810 Presence of automatic (implantable) cardiac defibrillator: Secondary | ICD-10-CM | POA: Insufficient documentation

## 2014-02-08 DIAGNOSIS — Y838 Other surgical procedures as the cause of abnormal reaction of the patient, or of later complication, without mention of misadventure at the time of the procedure: Secondary | ICD-10-CM | POA: Diagnosis not present

## 2014-02-08 DIAGNOSIS — I10 Essential (primary) hypertension: Secondary | ICD-10-CM | POA: Insufficient documentation

## 2014-02-08 DIAGNOSIS — Z794 Long term (current) use of insulin: Secondary | ICD-10-CM | POA: Insufficient documentation

## 2014-02-08 DIAGNOSIS — Z452 Encounter for adjustment and management of vascular access device: Secondary | ICD-10-CM | POA: Diagnosis not present

## 2014-02-08 DIAGNOSIS — Z7982 Long term (current) use of aspirin: Secondary | ICD-10-CM | POA: Insufficient documentation

## 2014-02-08 DIAGNOSIS — E119 Type 2 diabetes mellitus without complications: Secondary | ICD-10-CM | POA: Diagnosis not present

## 2014-02-08 DIAGNOSIS — Z79899 Other long term (current) drug therapy: Secondary | ICD-10-CM | POA: Insufficient documentation

## 2014-02-08 DIAGNOSIS — I509 Heart failure, unspecified: Secondary | ICD-10-CM | POA: Diagnosis not present

## 2014-02-08 MED ORDER — SODIUM CHLORIDE 0.9 % IJ SOLN: 10.0000 mL | INTRAMUSCULAR | Status: DC | PRN

## 2014-02-08 MED ORDER — SODIUM CHLORIDE 0.9 % IJ SOLN: 10.0000 mL | Freq: Two times a day (BID) | INTRAMUSCULAR | Status: DC

## 2014-02-08 NOTE — ED Notes (Signed)
IV team at bedside 

## 2014-02-08 NOTE — Progress Notes (Signed)
Peripherally Inserted Central Catheter/Midline Placement  The IV Nurse has discussed with the patient and/or persons authorized to consent for the patient, the purpose of this procedure and the potential benefits and risks involved with this procedure.  The benefits include less needle sticks, lab draws from the catheter and patient may be discharged home with the catheter.  Risks include, but not limited to, infection, bleeding, blood clot (thrombus formation), and puncture of an artery; nerve damage and irregular heat beat.  Alternatives to this procedure were also discussed.  PICC/Midline Placement Documentation  PICC / Midline Double Lumen 07/23/13 PICC Right Brachial 43 cm (Active)     PICC / Midline Double Lumen 02/08/14 PICC Right 43 cm 0 cm (Active)  Indication for Insertion or Continuance of Line Vasoactive infusions 02/08/2014  6:00 PM  Exposed Catheter (cm) 0 cm 02/08/2014  6:00 PM  Dressing Change Due 02/15/14 02/08/2014  6:00 PM       Stacie Glaze Horton 02/08/2014, 6:48 PM

## 2014-02-08 NOTE — ED Provider Notes (Signed)
CSN: [id number]     Arrival date & time 02/08/14  1449 History  This chart was scribed for non-physician practitioner, Junious Silk, PA-C,working with Toy Baker, MD, by Karle Plumber, ED Scribe. This patient was seen in room C28C/C28C and the patient's care was started at 3:20 PM.  Chief Complaint  Patient presents with  . pulled PICC line out    The history is provided by the patient. No language interpreter was used.   HPI Comments:  Gary Gallegos is a 68 y.o. male with PMH of CHF, HTN and DM who presents to the Emergency Department needing his PICC line replaced after accidentally pulling it out PTA. Pt states he was sleeping and it got tangled up and came the majority of the way out. He states it is still partially attached and his milrinone is still running. He states "I feel fine" and denies any other complaints. He denies CP, SOB, leg swelling, light headedness or dizziness.   Past Medical History  Diagnosis Date  . CHF (congestive heart failure)   . Diabetes mellitus without complication   . Hypertension   . Impaired vision in both eyes    Past Surgical History  Procedure Laterality Date  . Cardiac defibrillator placement    . Esophagogastroduodenoscopy N/A 07/09/2013    Procedure: ESOPHAGOGASTRODUODENOSCOPY (EGD);  Surgeon: Hart Carwin, MD;  Location: [facility] ENDOSCOPY;  Service: Endoscopy;  Laterality: N/A;   Family History  Problem Relation Age of Onset  . Heart disease Father    History  Substance Use Topics  . Smoking status: Never Smoker   . Smokeless tobacco: Not on file  . Alcohol Use: No    Review of Systems  Respiratory: Negative for shortness of breath.   Cardiovascular: Negative for chest pain.  Neurological: Negative for dizziness and light-headedness.  All other systems reviewed and are negative.   Allergies  Penicillins and Sulfa antibiotics  Home Medications   Prior to Admission medications   Medication Sig Start Date End Date Taking?  Authorizing Provider  aspirin EC 81 MG tablet Take 81 mg by mouth daily.    Historical Provider, MD  carvedilol (COREG) 3.125 MG tablet Take 1 tablet (3.125 mg total) by mouth 2 (two) times daily with a meal. 09/07/13   Aundria Rud, NP  glucose blood (ACCU-CHEK SMARTVIEW) test strip Use to check blood sugar once daily 12/23/13   Baker Pierini, FNP  insulin glargine (LANTUS) 100 UNIT/ML injection Inject 0.05 mLs (5 Units total) into the skin at bedtime. 12/23/13   Baker Pierini, FNP  lisinopril (PRINIVIL,ZESTRIL) 5 MG tablet take 1 tablet by mouth once daily    Aundria Rud, NP  Magnesium Oxide 200 MG TABS Take 2 tablets (400 mg total) by mouth 2 (two) times daily. 10/16/13   Dolores Patty, MD  milrinone [facility]) 20 MG/100ML SOLN infusion Inject 7.75 mcg/min into the vein continuous. 10/08/13   Aundria Rud, NP  mirtazapine (REMERON) 7.5 MG tablet Take 1 tablet (7.5 mg total) by mouth at bedtime. 11/26/13   Baker Pierini, FNP  ondansetron (ZOFRAN) 4 MG tablet Take 1 tablet (4 mg total) by mouth every 6 (six) hours as needed for nausea. 02/14/13   Shanker Levora Dredge, MD  pantoprazole (PROTONIX) 20 MG tablet take 1 tablet by mouth once daily 12/10/13   Baker Pierini, FNP  potassium chloride SA (K-DUR,KLOR-CON) 20 MEQ tablet Take 1 tablet (20 mEq total) by mouth 2 (two) times daily. 11/23/13  Aundria Rud, NP  sertraline (ZOLOFT) 50 MG tablet take 1 tablet by mouth once daily 12/23/13   Baker Pierini, FNP  simvastatin (ZOCOR) 5 MG tablet Take 5 mg by mouth at bedtime.    Historical Provider, MD  torsemide (DEMADEX) 20 MG tablet Take 60 mg (3 tablets) in the am and 40 gm (2 tablets) in the pm. If weight less than 139 lbs hold afternoon dose. 11/23/13   Aundria Rud, NP   Triage Vitals: BP 94/66  Pulse 108  Temp(Src) 98.1 F (36.7 C) (Oral)  Resp 18  SpO2 97% Physical Exam  Nursing note and vitals reviewed. Constitutional: He is oriented to person, place, and time. He  appears well-developed and well-nourished. No distress.  HENT:  Head: Normocephalic and atraumatic.  Right Ear: External ear normal.  Left Ear: External ear normal.  Nose: Nose normal.  Eyes: Conjunctivae are normal.  Neck: Normal range of motion. No tracheal deviation present.  Cardiovascular: Normal rate, regular rhythm and normal heart sounds.  Exam reveals no gallop and no friction rub.   No murmur heard. Pulmonary/Chest: Effort normal and breath sounds normal. No stridor. No respiratory distress. He has no wheezes. He has no rales.  Abdominal: Soft. He exhibits no distension. There is no tenderness.  Musculoskeletal: Normal range of motion.  Neurological: He is alert and oriented to person, place, and time.  Skin: Skin is warm and dry. He is not diaphoretic.  PICC line partially in place in right arm.  Psychiatric: He has a normal mood and affect. His behavior is normal.    ED Course  Procedures (including critical care time) DIAGNOSTIC STUDIES: Oxygen Saturation is 97% on RA, normal by my interpretation.   COORDINATION OF CARE: 3:28 PM- Will have PICC line replaced. Pt verbalizes understanding and agrees to plan.  Medications - No data to display  Labs Review Labs Reviewed - No data to display  Imaging Review No results found.   EKG Interpretation None      MDM   Final diagnoses:  Status post peripherally inserted central catheter (PICC) central line placement   Patient presents to ED after pulling his PICC line part of the way out. Patient receiving milrinone. Milrinone is still running. IV team will come and replace PICC line in ED. Patient otherwise feels well. No chest pain, shortness of breath, leg swelling. Patient signed out to Jimmey Ralph, PA-C at change of shift. Patient to be discharged with PICC line and milrinone. Discussed case with Dr. Freida Busman who agrees with plan. Patient / Family / Caregiver informed of clinical course, understand medical decision-making  process, and agree with plan.   I personally performed the services described in this documentation, which was scribed in my presence. The recorded information has been reviewed and is accurate.    Mora Bellman, PA-C 02/08/14 [phone]

## 2014-02-08 NOTE — Discharge Instructions (Signed)
Call for a follow up appointment with a Family or Primary Care Provider.  Return if Symptoms worsen, you develop swelling or redness around the site.   Take medication as prescribed.  Keep a clean sock around the PICC site at night to avoid the line dislodging.

## 2014-02-08 NOTE — ED Notes (Signed)
Spoke with IV team about replacement of line.

## 2014-02-08 NOTE — ED Provider Notes (Signed)
Patient care assumed at shift change from [place], [place].  Pt is a 68 year old male waiting for PICC line replacement and discharge home. IV team paged.  Awaiting replacement. 4 simple interrupted 4-0 Prolene sutures placed to hold PICC line placed by this provider. Pt tolerated well.  Plan to discharge home, pt has home health in place. Pt was restarted on Milrinone infusion while in the ED.  Mellody Drown, PA-C 02/08/14 1954

## 2014-02-08 NOTE — ED Notes (Signed)
Pt here with PICC line pulled out last night; pt get milrinone drip

## 2014-02-09 ENCOUNTER — Telehealth (HOSPITAL_COMMUNITY): Payer: Self-pay | Admitting: Vascular Surgery

## 2014-02-09 MED ORDER — LISINOPRIL 5 MG PO TABS: 5.0000 mg | ORAL_TABLET | Freq: Every day | ORAL | Status: DC

## 2014-02-09 NOTE — ED Provider Notes (Signed)
Medical screening examination/treatment/procedure(s) were performed by non-physician practitioner and as supervising physician I was immediately available for consultation/collaboration.   EKG Interpretation None       Toy Baker, MD 02/09/14 [phone]

## 2014-02-09 NOTE — Telephone Encounter (Signed)
Refill sent in, left mess this was done

## 2014-02-09 NOTE — Telephone Encounter (Signed)
Refill Lisinopril

## 2014-02-09 NOTE — ED Provider Notes (Signed)
Medical screening examination/treatment/procedure(s) were performed by non-physician practitioner and as supervising physician I was immediately available for consultation/collaboration.   EKG Interpretation None        Glynn Octave, MD 02/09/14 [phone]

## 2014-02-11 ENCOUNTER — Other Ambulatory Visit (HOSPITAL_COMMUNITY): Payer: Self-pay | Admitting: Internal Medicine

## 2014-02-12 ENCOUNTER — Encounter: Payer: Self-pay | Admitting: Internal Medicine

## 2014-02-12 LAB — CBC WITH DIFFERENTIAL
BASOS ABS: 0 10*3/uL (ref 0.0–0.2)
BASOS: 1 %
EOS ABS: 0.3 10*3/uL (ref 0.0–0.4)
Eos: 4 %
HEMATOCRIT: 39.8 % (ref 37.5–51.0)
HEMOGLOBIN: 13.5 g/dL (ref 12.6–17.7)
Immature Grans (Abs): 0 10*3/uL (ref 0.0–0.1)
Immature Granulocytes: 0 %
Lymphocytes Absolute: 1.1 10*3/uL (ref 0.7–3.1)
Lymphs: 17 %
MCH: 30.5 pg (ref 26.6–33.0)
MCHC: 33.9 g/dL (ref 31.5–35.7)
MCV: 90 fL (ref 79–97)
MONOS ABS: 0.7 10*3/uL (ref 0.1–0.9)
Monocytes: 11 %
NEUTROS ABS: 4.3 10*3/uL (ref 1.4–7.0)
Neutrophils Relative %: 67 %
Platelets: 171 10*3/uL (ref 150–379)
RBC: 4.43 x10E6/uL (ref 4.14–5.80)
RDW: 13.9 % (ref 12.3–15.4)
WBC: 6.4 10*3/uL (ref 3.4–10.8)

## 2014-02-12 LAB — COMPREHENSIVE METABOLIC PANEL
A/G RATIO: 1.2 (ref 1.1–2.5)
ALK PHOS: 94 IU/L (ref 39–117)
ALT: 15 IU/L (ref 0–44)
AST: 21 IU/L (ref 0–40)
Albumin: 4.1 g/dL (ref 3.6–4.8)
BILIRUBIN TOTAL: 0.4 mg/dL (ref 0.0–1.2)
BUN / CREAT RATIO: 26 — AB (ref 10–22)
BUN: 32 mg/dL — ABNORMAL HIGH (ref 8–27)
CHLORIDE: 102 mmol/L (ref 97–108)
CO2: 25 mmol/L (ref 18–29)
Calcium: 9.5 mg/dL (ref 8.6–10.2)
Creatinine, Ser: 1.22 mg/dL (ref 0.76–1.27)
GFR, EST AFRICAN AMERICAN: 71 mL/min/{1.73_m2} (ref >59)
GFR, EST NON AFRICAN AMERICAN: 61 mL/min/{1.73_m2} (ref >59)
Globulin, Total: 3.3 g/dL (ref 1.5–4.5)
Glucose: 139 mg/dL — ABNORMAL HIGH (ref 65–99)
Potassium: 4.3 mmol/L (ref 3.5–5.2)
SODIUM: 140 mmol/L (ref 134–144)
TOTAL PROTEIN: 7.4 g/dL (ref 6.0–8.5)

## 2014-02-12 LAB — PROTIME-INR
INR: 1.2 (ref 0.8–1.2)
Prothrombin Time: 12.1 s — ABNORMAL HIGH (ref 9.1–12.0)

## 2014-02-12 LAB — MAGNESIUM: Magnesium: 2.4 mg/dL (ref 1.6–2.6)

## 2014-02-15 ENCOUNTER — Encounter: Payer: Self-pay | Admitting: Internal Medicine

## 2014-02-17 ENCOUNTER — Encounter: Payer: Self-pay | Admitting: Family

## 2014-02-18 ENCOUNTER — Telehealth (HOSPITAL_COMMUNITY): Payer: Self-pay | Admitting: Vascular Surgery

## 2014-02-18 ENCOUNTER — Other Ambulatory Visit: Payer: Self-pay

## 2014-02-18 ENCOUNTER — Ambulatory Visit (HOSPITAL_COMMUNITY)
Admission: RE | Admit: 2014-02-18 | Discharge: 2014-02-18 | Disposition: A | Discharge status: Home / Self Care | Payer: Medicare Other | Source: Ambulatory Visit | Attending: Internal Medicine | Admitting: Internal Medicine

## 2014-02-18 ENCOUNTER — Encounter: Payer: Self-pay | Admitting: Family

## 2014-02-18 VITALS — BP 92/64 | HR 110 | Wt 146.5 lb

## 2014-02-18 DIAGNOSIS — E1139 Type 2 diabetes mellitus with other diabetic ophthalmic complication: Secondary | ICD-10-CM | POA: Diagnosis not present

## 2014-02-18 DIAGNOSIS — Z7982 Long term (current) use of aspirin: Secondary | ICD-10-CM | POA: Diagnosis not present

## 2014-02-18 DIAGNOSIS — I509 Heart failure, unspecified: Secondary | ICD-10-CM | POA: Insufficient documentation

## 2014-02-18 DIAGNOSIS — I498 Other specified cardiac arrhythmias: Secondary | ICD-10-CM | POA: Insufficient documentation

## 2014-02-18 DIAGNOSIS — I10 Essential (primary) hypertension: Secondary | ICD-10-CM | POA: Diagnosis not present

## 2014-02-18 DIAGNOSIS — Z794 Long term (current) use of insulin: Secondary | ICD-10-CM | POA: Insufficient documentation

## 2014-02-18 DIAGNOSIS — I5022 Chronic systolic (congestive) heart failure: Secondary | ICD-10-CM

## 2014-02-18 DIAGNOSIS — I5042 Chronic combined systolic (congestive) and diastolic (congestive) heart failure: Secondary | ICD-10-CM | POA: Insufficient documentation

## 2014-02-18 DIAGNOSIS — E11319 Type 2 diabetes mellitus with unspecified diabetic retinopathy without macular edema: Secondary | ICD-10-CM | POA: Diagnosis not present

## 2014-02-18 MED ORDER — GLUCOSE BLOOD VI STRP: ORAL_STRIP | Status: DC

## 2014-02-18 NOTE — Telephone Encounter (Signed)
Nurse [facility] home [facility] left message.. Pt bp 80/64.Marland Kitchen Please advise

## 2014-02-18 NOTE — Progress Notes (Signed)
Patient ID: Gary Gallegos, male   DOB: Apr 17, 1946, 68 y.o.   MRN: [id number]  PCP: Dr. Adline Mango, FNP Primary Cardiologist: Dr. [name]  HPI: [name] is a 68 yo male referred to the HF clinic by Dr.Nelson. He has a history of DM2 with severe diabetic retinopathy legally blind September , cataracts HTN, HLD, chronic systolic HF due to severe, EF 40% (11/14) s/p ICD Medtronic.   Started having problems with his heart in early 2013. Was followed by cardiologist at [facility]. Had cath in 2013 and arteries were "fine".  ECHO 05/05/13: EF 10%, diff HK, grade II DD, mild MR, LA mod dilated, RV mildly dilated and sys fx mod reduced, mod TR ECHO 11/04/13  EF 10% mod-severe MR RV mod reduced. TAPSE 1.6   Admitted to [facility] in January with N/V/D. EGD  Showed severe gastritis and esophagitis. Started on Milrinone 0.25 mcg via PICC. Discharge weight 133 pounds. Followed by [facility].   Follow up for Heart Failure: Returns for routine f/u. Says he feels ok. Weight up a pound or two because he is continue to eat more. Still coughing. Takes extra torsemide as needed. Denies Orthopnea. Denies dizziness. He reamins on Milrinone 0.25 mcg. Weight at home 139-140 pounds.  Following salt diet and drinking less, but drinking more than 2L a day. Followed [facility].   Optivol  down well below threshold  with activity less than 1 hour per day.  Labs 07/15/13 K 5.7 Creatinine 1.95 instructed to cut lisinopril 5 mg daily.  Labs 07/22/13 K 3.6 Creatinine 0.81 Dig 1.9 digoxin stopped Labs  08/17/13 K 3.7 Creatinine 0.8 Glucose 190           08/20/13: K+ 3.7, Creatinine 0.70, Glucose 123, Na 137           10/22/13: K 3.9, creatinine 0.69, glucose 179           02/11/14: K 4.3 cr 1.2  SH: Lives with his daughter, son in law, and 2 granddaughters.     ROS: All systems negative except as listed in HPI, PMH and Problem List.  Past Medical History  Diagnosis Date  . CHF (congestive heart failure)   . Diabetes mellitus  without complication   . Hypertension   . Impaired vision in both eyes     Current Outpatient Prescriptions  Medication Sig Dispense Refill  . aspirin EC 81 MG tablet Take 81 mg by mouth daily.      . carvedilol (COREG) 3.125 MG tablet Take 1 tablet (3.125 mg total) by mouth 2 (two) times daily with a meal.  60 tablet  3  . glucose blood (ACCU-CHEK AVIVA) test strip Use to check blood sugar once daily  100 each  12  . insulin glargine (LANTUS) 100 UNIT/ML injection Inject 12 Units into the skin at bedtime.      Marland Kitchen lisinopril (PRINIVIL,ZESTRIL) 5 MG tablet Take 1 tablet (5 mg total) by mouth daily.  30 tablet  6  . magnesium oxide (MAG-OX) 400 MG tablet Take 400 mg by mouth daily.      . milrinone (PRIMACOR) 20 MG/100ML SOLN infusion Inject 7.75 mcg/min into the vein continuous.  100 mL    . mirtazapine (REMERON) 7.5 MG tablet Take 1 tablet (7.5 mg total) by mouth at bedtime.  30 tablet  3  . ondansetron (ZOFRAN) 4 MG tablet Take 1 tablet (4 mg total) by mouth every 6 (six) hours as needed for nausea.  20 tablet  0  .  pantoprazole (PROTONIX) 20 MG tablet Take 20 mg by mouth daily.      . potassium chloride SA (K-DUR,KLOR-CON) 20 MEQ tablet Take 1 tablet (20 mEq total) by mouth 2 (two) times daily.  60 tablet  3  . sertraline (ZOLOFT) 50 MG tablet Take 50 mg by mouth daily.      . simvastatin (ZOCOR) 5 MG tablet Take 5 mg by mouth at bedtime.      . torsemide (DEMADEX) 20 MG tablet Take 60 mg by mouth daily. Take 40 mg in PM as needed       No current facility-administered medications for this encounter.    Filed Vitals:   02/18/14 1040  BP: 92/64  Pulse: 110  Weight: 146 lb 8 oz (66.452 kg)  SpO2: 99%    PHYSICAL EXAM: General: Chronically ill appearing. No resp difficulty, daughter present.  HEENT: normal; arcus senilus.  Neck: supple. JVP~5-6 . Carotids 2+ bilaterally; no bruits. No lymphadenopathy or thryomegaly appreciated. Cor: PMI laterally displaced.Tachy regular. No rubs. +  S3; +3/6 systolic murmur  Lungs: clear Abdomen: soft, nontender, non distended. No hepatosplenomegaly. No bruits or masses. Good bowel sounds. Extremities: no cyanosis, clubbing, rash, edema; RUE picc Neuro: alert & orientedx3, cranial nerves grossly intact. Moves all 4 extremities w/o difficulty. Affect pleasant.    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 EF 10% mod-severe MR RV mod reduced. TAPSE 1.6 (10/2013) - Overall stable and doing well on milrinone  -Optivol well below threshold with activity around 1 hour per day.  - NYHA II-III symptoms on milrinone. Volume status stable despite weight gain. Continue  torsemide to 60 mg q am and 40 mg q pm. Instructed to hold afternoon torsemide if home weight less than 139 lbs. Increase potassium to 20 meq BID.  - Continue coreg 3.125 mg BID. - Continue lisinopril 5 mg daily will not titrate due to soft BP.   - Renal function slightly worse on last test. Will recheck BMET per  [facility] and weekly on Thursday. Adjust diuretics as needed.  - Reinforced the need and importance of daily weights, a low sodium diet, and fluid restriction (less than 2 L a day). Instructed to call the HF clinic if weight increases more than 3 lbs overnight or 5 lbs in a week. 2)  Diabetes with diabetic retinopathy-  Had surgery on R eye and vision is much improved. Not able to do L eye surgery currently because heart too weak. Discussed the need to continue follow blood sugars closely and to follow up with PCP.  3) Sinus Tachycardia- due to HF Not candidate for ivabradine due to shock/milrinone.  4) HTN- Stable will continue current medications.  5) DM- Per PCP. Improving better controlled   Arvilla Meres MD  11:06 AM

## 2014-02-18 NOTE — Patient Instructions (Signed)
Your physician recommends that you schedule a follow-up appointment in: 2 months  

## 2014-02-18 NOTE — Addendum Note (Signed)
Encounter addended by: Noralee Space, RN on: 02/18/2014 11:19 AM<BR>     Documentation filed: Patient Instructions Section

## 2014-02-18 NOTE — Telephone Encounter (Signed)
LM BP today was 92/64 at OV and Dr [name] was not concerned, we had advised him if home wt less than 139 to hold evening Torsemide, call back if she has further questions

## 2014-02-19 ENCOUNTER — Telehealth (HOSPITAL_COMMUNITY): Payer: Self-pay | Admitting: Vascular Surgery

## 2014-02-19 DIAGNOSIS — I509 Heart failure, unspecified: Secondary | ICD-10-CM

## 2014-02-19 MED ORDER — CARVEDILOL 3.125 MG PO TABS: 3.1250 mg | ORAL_TABLET | Freq: Two times a day (BID) | ORAL | Status: DC

## 2014-02-19 MED ORDER — MAGNESIUM OXIDE 400 MG PO TABS: 400.0000 mg | ORAL_TABLET | Freq: Every day | ORAL | Status: DC

## 2014-02-19 NOTE — Telephone Encounter (Signed)
As requested refills sent into pharm

## 2014-02-19 NOTE — Telephone Encounter (Signed)
Refill Carvedilol , Magnesium

## 2014-02-23 ENCOUNTER — Encounter: Payer: Self-pay | Admitting: Internal Medicine

## 2014-02-25 ENCOUNTER — Encounter: Payer: Medicare Other | Admitting: Family

## 2014-02-25 ENCOUNTER — Other Ambulatory Visit (HOSPITAL_COMMUNITY): Payer: Self-pay | Admitting: Internal Medicine

## 2014-02-26 ENCOUNTER — Ambulatory Visit (INDEPENDENT_AMBULATORY_CARE_PROVIDER_SITE_OTHER): Payer: Medicare Other | Admitting: Family

## 2014-02-26 ENCOUNTER — Encounter: Payer: Self-pay | Admitting: Family

## 2014-02-26 VITALS — BP 102/62 | HR 103 | Temp 97.9°F | Ht 69.75 in | Wt 148.2 lb

## 2014-02-26 DIAGNOSIS — Z0000 Encounter for general adult medical examination without abnormal findings: Secondary | ICD-10-CM

## 2014-02-26 DIAGNOSIS — N400 Enlarged prostate without lower urinary tract symptoms: Secondary | ICD-10-CM

## 2014-02-26 DIAGNOSIS — Z125 Encounter for screening for malignant neoplasm of prostate: Secondary | ICD-10-CM

## 2014-02-26 DIAGNOSIS — IMO0001 Reserved for inherently not codable concepts without codable children: Secondary | ICD-10-CM

## 2014-02-26 DIAGNOSIS — K7031 Alcoholic cirrhosis of liver with ascites: Secondary | ICD-10-CM

## 2014-02-26 DIAGNOSIS — K7030 Alcoholic cirrhosis of liver without ascites: Secondary | ICD-10-CM

## 2014-02-26 DIAGNOSIS — I10 Essential (primary) hypertension: Secondary | ICD-10-CM

## 2014-02-26 DIAGNOSIS — E785 Hyperlipidemia, unspecified: Secondary | ICD-10-CM

## 2014-02-26 DIAGNOSIS — E1165 Type 2 diabetes mellitus with hyperglycemia: Secondary | ICD-10-CM

## 2014-02-26 DIAGNOSIS — Z23 Encounter for immunization: Secondary | ICD-10-CM

## 2014-02-26 DIAGNOSIS — I5022 Chronic systolic (congestive) heart failure: Secondary | ICD-10-CM

## 2014-02-26 DIAGNOSIS — R300 Dysuria: Secondary | ICD-10-CM

## 2014-02-26 DIAGNOSIS — I509 Heart failure, unspecified: Secondary | ICD-10-CM

## 2014-02-26 LAB — BASIC METABOLIC PANEL
BUN: 25 mg/dL — AB (ref 6–23)
CALCIUM: 9.6 mg/dL (ref 8.4–10.5)
CO2: 29 meq/L (ref 19–32)
CREATININE: 1.3 mg/dL (ref 0.4–1.5)
Chloride: 102 mEq/L (ref 96–112)
GFR: 69.66 mL/min (ref >60.00)
GLUCOSE: 142 mg/dL — AB (ref 70–99)
Potassium: 4.4 mEq/L (ref 3.5–5.1)
Sodium: 138 mEq/L (ref 135–145)

## 2014-02-26 LAB — COMPREHENSIVE METABOLIC PANEL
A/G RATIO: 1.3 (ref 1.1–2.5)
ALT: 13 IU/L (ref 0–44)
AST: 23 IU/L (ref 0–40)
Albumin: 4.2 g/dL (ref 3.6–4.8)
Alkaline Phosphatase: 86 IU/L (ref 39–117)
BUN / CREAT RATIO: 23 — AB (ref 10–22)
BUN: 24 mg/dL (ref 8–27)
CO2: 25 mmol/L (ref 18–29)
Calcium: 9.5 mg/dL (ref 8.6–10.2)
Chloride: 98 mmol/L (ref 97–108)
Creatinine, Ser: 1.04 mg/dL (ref 0.76–1.27)
GFR calc non Af Amer: 74 mL/min/{1.73_m2} (ref >59)
GFR, EST AFRICAN AMERICAN: 86 mL/min/{1.73_m2} (ref >59)
GLUCOSE: 93 mg/dL (ref 65–99)
Globulin, Total: 3.3 g/dL (ref 1.5–4.5)
POTASSIUM: 4.5 mmol/L (ref 3.5–5.2)
Sodium: 142 mmol/L (ref 134–144)
TOTAL PROTEIN: 7.5 g/dL (ref 6.0–8.5)
Total Bilirubin: 0.6 mg/dL (ref 0.0–1.2)

## 2014-02-26 LAB — CBC WITH DIFFERENTIAL
BASOS: 0 %
Basophils Absolute: 0 10*3/uL (ref 0.0–0.2)
EOS ABS: 0.2 10*3/uL (ref 0.0–0.4)
Eos: 3 %
HEMATOCRIT: 40.7 % (ref 37.5–51.0)
HEMOGLOBIN: 13.9 g/dL (ref 12.6–17.7)
Immature Grans (Abs): 0 10*3/uL (ref 0.0–0.1)
Immature Granulocytes: 0 %
LYMPHS ABS: 1.1 10*3/uL (ref 0.7–3.1)
Lymphs: 18 %
MCH: 30.2 pg (ref 26.6–33.0)
MCHC: 34.2 g/dL (ref 31.5–35.7)
MCV: 89 fL (ref 79–97)
Monocytes Absolute: 0.6 10*3/uL (ref 0.1–0.9)
Monocytes: 9 %
NEUTROS ABS: 4.4 10*3/uL (ref 1.4–7.0)
Neutrophils Relative %: 70 %
Platelets: 184 10*3/uL (ref 150–379)
RBC: 4.6 x10E6/uL (ref 4.14–5.80)
RDW: 13.6 % (ref 12.3–15.4)
WBC: 6.3 10*3/uL (ref 3.4–10.8)

## 2014-02-26 LAB — PSA: PSA: 2.51 ng/mL (ref 0.10–4.00)

## 2014-02-26 LAB — CBC WITH DIFFERENTIAL/PLATELET
Basophils Absolute: 0 10*3/uL (ref 0.0–0.1)
Basophils Relative: 0.5 % (ref 0.0–3.0)
Eosinophils Absolute: 0.2 10*3/uL (ref 0.0–0.7)
Eosinophils Relative: 2.8 % (ref 0.0–5.0)
HCT: 42.7 % (ref 39.0–52.0)
Hemoglobin: 14.2 g/dL (ref 13.0–17.0)
Lymphocytes Relative: 16.8 % (ref 12.0–46.0)
Lymphs Abs: 1 10*3/uL (ref 0.7–4.0)
MCHC: 33.3 g/dL (ref 30.0–36.0)
MCV: 90.6 fl (ref 78.0–100.0)
Monocytes Absolute: 0.5 10*3/uL (ref 0.1–1.0)
Monocytes Relative: 8.5 % (ref 3.0–12.0)
Neutro Abs: 4.4 10*3/uL (ref 1.4–7.7)
Neutrophils Relative %: 71.4 % (ref 43.0–77.0)
Platelets: 175 10*3/uL (ref 150.0–400.0)
RBC: 4.71 Mil/uL (ref 4.22–5.81)
RDW: 13.6 % (ref 11.5–15.5)
WBC: 6.1 10*3/uL (ref 4.0–10.5)

## 2014-02-26 LAB — MAGNESIUM: Magnesium: 2.1 mg/dL (ref 1.6–2.6)

## 2014-02-26 LAB — PROTIME-INR
INR: 1.2 (ref 0.8–1.2)
Prothrombin Time: 12.1 s — ABNORMAL HIGH (ref 9.1–12.0)

## 2014-02-26 LAB — POCT URINALYSIS DIPSTICK
Bilirubin, UA: NEGATIVE
Blood, UA: NEGATIVE
GLUCOSE UA: NEGATIVE
Ketones, UA: NEGATIVE
NITRITE UA: POSITIVE
Protein, UA: NEGATIVE
Spec Grav, UA: <=1.005
UROBILINOGEN UA: 0.2
pH, UA: 5.5

## 2014-02-26 LAB — MICROALBUMIN / CREATININE URINE RATIO
CREATININE, U: 48.1 mg/dL
Microalb Creat Ratio: 6.4 mg/g (ref 0.0–30.0)
Microalb, Ur: 3.1 mg/dL — ABNORMAL HIGH (ref 0.0–1.9)

## 2014-02-26 LAB — HEPATIC FUNCTION PANEL
ALBUMIN: 4.2 g/dL (ref 3.5–5.2)
ALT: 18 U/L (ref 0–53)
AST: 23 U/L (ref 0–37)
Alkaline Phosphatase: 90 U/L (ref 39–117)
Bilirubin, Direct: 0.2 mg/dL (ref 0.0–0.3)
Total Bilirubin: 1.1 mg/dL (ref 0.2–1.2)
Total Protein: 7.8 g/dL (ref 6.0–8.3)

## 2014-02-26 LAB — LIPID PANEL
CHOLESTEROL: 119 mg/dL (ref 0–200)
HDL: 30 mg/dL — ABNORMAL LOW (ref >39.00)
LDL Cholesterol: 62 mg/dL (ref 0–99)
NonHDL: 89
Total CHOL/HDL Ratio: 4
Triglycerides: 134 mg/dL (ref 0.0–149.0)
VLDL: 26.8 mg/dL (ref 0.0–40.0)

## 2014-02-26 LAB — TSH: TSH: 2.68 u[IU]/mL (ref 0.35–4.50)

## 2014-02-26 LAB — HEMOGLOBIN A1C: HEMOGLOBIN A1C: 9.1 % — AB (ref 4.6–6.5)

## 2014-02-26 MED ORDER — GLUCOSE BLOOD VI STRP: ORAL_STRIP | Status: DC

## 2014-02-26 NOTE — Patient Instructions (Signed)
Cardiac Diet This diet can help prevent heart disease and stroke. Many factors influence your heart health, including eating and exercise habits. Coronary risk rises a lot with abnormal blood fat (lipid) levels. Cardiac meal planning includes limiting unhealthy fats, increasing healthy fats, and making other small dietary changes. General guidelines are as follows:  Adjust calorie intake to reach and maintain desirable body weight.  Limit total fat intake to less than 30% of total calories. Saturated fat should be less than 7% of calories.  Saturated fats are found in animal products and in some vegetable products. Saturated vegetable fats are found in coconut oil, cocoa butter, palm oil, and palm kernel oil. Read labels carefully to avoid these products as much as possible. Use butter in moderation. Choose tub margarines and oils that have 2 grams of fat or less. Good cooking oils are canola and olive oils.  Practice low-fat cooking techniques. Do not fry food. Instead, broil, bake, boil, steam, grill, roast on a rack, stir-fry, or microwave it. Other fat reducing suggestions include:  Remove the skin from poultry.  Remove all visible fat from meats.  Skim the fat off stews, soups, and gravies before serving them.  Steam vegetables in water or broth instead of sauting them in fat.  Avoid foods with trans fat (or hydrogenated oils), such as commercially fried foods and commercially baked goods. Commercial shortening and deep-frying fats will contain trans fat.  Increase intake of fruits, vegetables, whole grains, and legumes to replace foods high in fat.  Increase consumption of nuts, legumes, and seeds to at least 4 servings weekly. One serving of a legume equals  cup, and 1 serving of nuts or seeds equals  cup.  Choose whole grains more often. Have 3 servings per day (a serving is 1 ounce [oz]).  Eat 4 to 5 servings of vegetables per day. A serving of vegetables is 1 cup of raw leafy  vegetables;  cup of raw or cooked cut-up vegetables;  cup of vegetable juice.  Eat 4 to 5 servings of fruit per day. A serving of fruit is 1 medium whole fruit;  cup of dried fruit;  cup of fresh, frozen, or canned fruit;  cup of 100% fruit juice.  Increase your intake of dietary fiber to 20 to 30 grams per day. Insoluble fiber may help lower your risk of heart disease and may help curb your appetite.  Soluble fiber binds cholesterol to be removed from the blood. Foods high in soluble fiber are dried beans, citrus fruits, oats, apples, bananas, broccoli, Brussels sprouts, and eggplant.  Try to include foods fortified with plant sterols or stanols, such as yogurt, breads, juices, or margarines. Choose several fortified foods to achieve a daily intake of 2 to 3 grams of plant sterols or stanols.  Foods with omega-3 fats can help reduce your risk of heart disease. Aim to have a 3.5 oz portion of fatty fish twice per week, such as salmon, mackerel, albacore tuna, sardines, lake trout, or herring. If you wish to take a fish oil supplement, choose one that contains 1 gram of both DHA and EPA.  Limit processed meats to 2 servings (3 oz portion) weekly.  Limit the sodium in your diet to 1500 milligrams (mg) per day. If you have high blood pressure, talk to a registered dietitian about a DASH (Dietary Approaches to Stop Hypertension) eating plan.  Limit sweets and beverages with added sugar, such as soda, to no more than 5 servings per week. One   serving is:   1 tablespoon sugar.  1 tablespoon jelly or jam.   cup sorbet.  1 cup lemonade.   cup regular soda. CHOOSING FOODS Starches  Allowed: Breads: All kinds (wheat, rye, raisin, white, oatmeal, Italian, French, and English muffin bread). Low-fat rolls: English muffins, frankfurter and hamburger buns, bagels, pita bread, tortillas (not fried). Pancakes, waffles, biscuits, and muffins made with recommended oil.  Avoid: Products made with  saturated or trans fats, oils, or whole milk products. Butter rolls, cheese breads, croissants. Commercial doughnuts, muffins, sweet rolls, biscuits, waffles, pancakes, store-bought mixes. Crackers  Allowed: Low-fat crackers and snacks: Animal, graham, rye, saltine (with recommended oil, no lard), oyster, and matzo crackers. Bread sticks, melba toast, rusks, flatbread, pretzels, and light popcorn.  Avoid: High-fat crackers: cheese crackers, butter crackers, and those made with coconut, palm oil, or trans fat (hydrogenated oils). Buttered popcorn. Cereals  Allowed: Hot or cold whole-grain cereals.  Avoid: Cereals containing coconut, hydrogenated vegetable fat, or animal fat. Potatoes / Pasta / Rice  Allowed: All kinds of potatoes, rice, and pasta (such as macaroni, spaghetti, and noodles).  Avoid: Pasta or rice prepared with cream sauce or high-fat cheese. Chow mein noodles, French fries. Vegetables  Allowed: All vegetables and vegetable juices.  Avoid: Fried vegetables. Vegetables in cream, butter, or high-fat cheese sauces. Limit coconut. Fruit in cream or custard. Protein  Allowed: Limit your intake of meat, seafood, and poultry to no more than 6 oz (cooked weight) per day. All lean, well-trimmed beef, veal, pork, and lamb. All chicken and turkey without skin. All fish and shellfish. Wild game: wild duck, rabbit, pheasant, and venison. Egg whites or low-cholesterol egg substitutes may be used as desired. Meatless dishes: recipes with dried beans, peas, lentils, and tofu (soybean curd). Seeds and nuts: all seeds and most nuts.  Avoid: Prime grade and other heavily marbled and fatty meats, such as short ribs, spare ribs, rib eye roast or steak, frankfurters, sausage, bacon, and high-fat luncheon meats, mutton. Caviar. Commercially fried fish. Domestic duck, goose, venison sausage. Organ meats: liver, gizzard, heart, chitterlings, brains, kidney, sweetbreads. Dairy  Allowed: Low-fat  cheeses: nonfat or low-fat cottage cheese (1% or 2% fat), cheeses made with part skim milk, such as mozzarella, farmers, string, or ricotta. (Cheeses should be labeled no more than 2 to 6 grams fat per oz.). Skim (or 1%) milk: liquid, powdered, or evaporated. Buttermilk made with low-fat milk. Drinks made with skim or low-fat milk or cocoa. Chocolate milk or cocoa made with skim or low-fat (1%) milk. Nonfat or low-fat yogurt.  Avoid: Whole milk cheeses, including colby, cheddar, muenster, Monterey Jack, Havarti, Brie, Camembert, American, Swiss, and blue. Creamed cottage cheese, cream cheese. Whole milk and whole milk products, including buttermilk or yogurt made from whole milk, drinks made from whole milk. Condensed milk, evaporated whole milk, and 2% milk. Soups and Combination Foods  Allowed: Low-fat low-sodium soups: broth, dehydrated soups, homemade broth, soups with the fat removed, homemade cream soups made with skim or low-fat milk. Low-fat spaghetti, lasagna, chili, and Spanish rice if low-fat ingredients and low-fat cooking techniques are used.  Avoid: Cream soups made with whole milk, cream, or high-fat cheese. All other soups. Desserts and Sweets  Allowed: Sherbet, fruit ices, gelatins, meringues, and angel food cake. Homemade desserts with recommended fats, oils, and milk products. Jam, jelly, honey, marmalade, sugars, and syrups. Pure sugar candy, such as gum drops, hard candy, jelly beans, marshmallows, mints, and small amounts of dark chocolate.  Avoid: Commercially prepared   cakes, pies, cookies, frosting, pudding, or mixes for these products. Desserts containing whole milk products, chocolate, coconut, lard, palm oil, or palm kernel oil. Ice cream or ice cream drinks. Candy that contains chocolate, coconut, butter, hydrogenated fat, or unknown ingredients. Buttered syrups. Fats and Oils  Allowed: Vegetable oils: safflower, sunflower, corn, soybean, cottonseed, sesame, canola, olive,  or peanut. Non-hydrogenated margarines. Salad dressing or mayonnaise: homemade or commercial, made with a recommended oil. Low or nonfat salad dressing or mayonnaise.  Limit added fats and oils to 6 to 8 tsp per day (includes fats used in cooking, baking, salads, and spreads on bread). Remember to count the "hidden fats" in foods.  Avoid: Solid fats and shortenings: butter, lard, salt pork, bacon drippings. Gravy containing meat fat, shortening, or suet. Cocoa butter, coconut. Coconut oil, palm oil, palm kernel oil, or hydrogenated oils: these ingredients are often used in bakery products, nondairy creamers, whipped toppings, candy, and commercially fried foods. Read labels carefully. Salad dressings made of unknown oils, sour cream, or cheese, such as blue cheese and Roquefort. Cream, all kinds: half-and-half, light, heavy, or whipping. Sour cream or cream cheese (even if "light" or low-fat). Nondairy cream substitutes: coffee creamers and sour cream substitutes made with palm, palm kernel, hydrogenated oils, or coconut oil. Beverages  Allowed: Coffee (regular or decaffeinated), tea. Diet carbonated beverages, mineral water. Alcohol: Check with your caregiver. Moderation is recommended.  Avoid: Whole milk, regular sodas, and juice drinks with added sugar. Condiments  Allowed: All seasonings and condiments. Cocoa powder. "Cream" sauces made with recommended ingredients.  Avoid: Carob powder made with hydrogenated fats. SAMPLE MENU Breakfast   cup orange juice   cup oatmeal  1 slice toast  1 tsp margarine  1 cup skim milk Lunch  Turkey sandwich with 2 oz turkey, 2 slices bread  Lettuce and tomato slices  Fresh fruit  Carrot sticks  Coffee or tea Snack  Fresh fruit or low-fat crackers Dinner  3 oz lean ground beef  1 baked potato  1 tsp margarine   cup asparagus  Lettuce salad  1 tbs non-creamy dressing   cup peach slices  1 cup skim milk Document Released:  03/13/2008 Document Revised: 12/04/2011 Document Reviewed: 08/04/2013 Exit[employer] Patient Information 2015 [employer]. This information is not intended to replace advice given to you by your health care provider. Make sure you discuss any questions you have with your health care provider.  

## 2014-02-26 NOTE — Progress Notes (Signed)
Subjective:    Patient ID: Gary Gallegos, male    DOB: May 31, 1946, 68 y.o.   MRN: [id number]  HPI 68 year old African American male, nonsmoker with a history of congestive heart failure with a 10% ejection fraction, type 2 diabetes, cirrhosis, hypercholesterolemia, hypertension, hyperlipidemia he is in today for a complete physical exam. Continues to see cardiology on a regular basis. Daughter checks his blood glucose is typically runs 90-100 fasting and 130s to 140s postprandially.   Patient presents for yearly preventative medicine examination. Medicare questionnaire was completed  All immunizations and health maintenance protocols were reviewed with the patient and needed orders were placed.  Appropriate screening laboratory values were ordered for the patient including screening of hyperlipidemia, renal function and hepatic function. If indicated by BPH, a PSA was ordered.  Medication reconciliation,  past medical history, social history, problem list and allergies were reviewed in detail with the patient  Goals were established with regard to weight loss, exercise, and  diet in compliance with medications  End of life planning was discussed.   Declines colonoscopy.   Review of Systems  Constitutional: Negative.   HENT: Negative.   Eyes: Negative.   Respiratory: Negative.   Cardiovascular: Negative.   Gastrointestinal: Negative.   Endocrine: Negative.   Genitourinary: Negative.   Musculoskeletal: Negative.   Skin: Negative.   Allergic/Immunologic: Negative.   Neurological: Negative.   Hematological: Negative.   Psychiatric/Behavioral: Negative.    Past Medical History  Diagnosis Date  . CHF (congestive heart failure)   . Diabetes mellitus without complication   . Hypertension   . Impaired vision in both eyes     History   Social History  . Marital Status: Widowed    Spouse Name: N/A    Number of Children: N/A  . Years of Education: N/A   Occupational  History  . Not on file.   Social History Main Topics  . Smoking status: Never Smoker   . Smokeless tobacco: Not on file  . Alcohol Use: No  . Drug Use: No  . Sexual Activity: Not on file   Other Topics Concern  . Not on file   Social History Narrative  . No narrative on file    Past Surgical History  Procedure Laterality Date  . Cardiac defibrillator placement    . Esophagogastroduodenoscopy N/A 07/09/2013    Procedure: ESOPHAGOGASTRODUODENOSCOPY (EGD);  Surgeon: Hart Carwin, MD;  Location: [facility] ENDOSCOPY;  Service: Endoscopy;  Laterality: N/A;    Family History  Problem Relation Age of Onset  . Heart disease Father     Allergies  Allergen Reactions  . Penicillins Nausea And Vomiting  . Sulfa Antibiotics Nausea And Vomiting    Current Outpatient Prescriptions on File Prior to Visit  Medication Sig Dispense Refill  . aspirin EC 81 MG tablet Take 81 mg by mouth daily.      . carvedilol (COREG) 3.125 MG tablet Take 1 tablet (3.125 mg total) by mouth 2 (two) times daily with a meal.  60 tablet  3  . insulin glargine (LANTUS) 100 UNIT/ML injection Inject 5 Units into the skin at bedtime.       Marland Kitchen lisinopril (PRINIVIL,ZESTRIL) 5 MG tablet Take 1 tablet (5 mg total) by mouth daily.  30 tablet  6  . magnesium oxide (MAG-OX) 400 MG tablet Take 1 tablet (400 mg total) by mouth daily.  30 tablet  3  . milrinone (PRIMACOR) 20 MG/100ML SOLN infusion Inject 7.75 mcg/min into the vein continuous.  100 mL    . mirtazapine (REMERON) 7.5 MG tablet Take 1 tablet (7.5 mg total) by mouth at bedtime.  30 tablet  3  . pantoprazole (PROTONIX) 20 MG tablet Take 20 mg by mouth daily.      . potassium chloride SA (K-DUR,KLOR-CON) 20 MEQ tablet Take 1 tablet (20 mEq total) by mouth 2 (two) times daily.  60 tablet  3  . sertraline (ZOLOFT) 50 MG tablet Take 50 mg by mouth daily.      . simvastatin (ZOCOR) 5 MG tablet Take 5 mg by mouth at bedtime.      . torsemide (DEMADEX) 20 MG tablet Take 60 mg  by mouth daily. Take 40 mg in PM as needed      . ondansetron (ZOFRAN) 4 MG tablet Take 1 tablet (4 mg total) by mouth every 6 (six) hours as needed for nausea.  20 tablet  0   No current facility-administered medications on file prior to visit.    BP 102/62  Pulse 103  Temp(Src) 97.9 F (36.6 C) (Oral)  Ht 5' 9.75" (1.772 m)  Wt 148 lb 3.2 oz (67.223 kg)  BMI 21.41 kg/m2chart    Objective:   Physical Exam  Constitutional: He is oriented to person, place, and time. He appears well-developed and well-nourished.  HENT:  Right Ear: External ear normal.  Left Ear: External ear normal.  Nose: Nose normal.  Mouth/Throat: Oropharynx is clear and moist.  Eyes: Conjunctivae are normal. Pupils are equal, round, and reactive to light.  Neck: Normal range of motion. Neck supple. No thyromegaly present.  Cardiovascular: Normal rate, regular rhythm and normal heart sounds.   Pulmonary/Chest: Effort normal and breath sounds normal.  Abdominal: Soft. Bowel sounds are normal.  Genitourinary: Rectum normal, prostate normal and penis normal. Guaiac negative stool. No penile tenderness.  Musculoskeletal: Normal range of motion.  Neurological: He is alert and oriented to person, place, and time. He has normal reflexes.  Skin: Skin is warm and dry.  Psychiatric: He has a normal mood and affect.          Assessment & Plan:  Gary Gallegos was seen today for no specified reason.  Diagnoses and associated orders for this visit:  Preventative health care - POC Urinalysis Dipstick  Chronic systolic congestive heart failure - Lipid Panel - Basic Metabolic Panel - POC Urinalysis Dipstick - TSH - CBC with Differential  Hyperlipidemia - Lipid Panel - Basic Metabolic Panel - TSH - CBC with Differential  Type II or unspecified type diabetes mellitus without mention of complication, uncontrolled - Hemoglobin A1c - Basic Metabolic Panel - POC Urinalysis Dipstick - Microalbumin/Creatinine Ratio,  Urine - TSH  Alcoholic cirrhosis of liver with ascites - Hepatic Function Panel - TSH - CBC with Differential  Unspecified essential hypertension - Basic Metabolic Panel - TSH  Screening PSA (prostate specific antigen)  Enlarged prostate - PSA  Need for prophylactic vaccination and inoculation against influenza - Flu Vaccine QUAD 36+ mos PF IM (Fluarix Quad PF)  Need for prophylactic vaccination with tetanus toxoid alone - Td vaccine preservative free greater than or equal to 7yo IM  Need for prophylactic vaccination against Streptococcus pneumoniae (pneumococcus) - Pneumococcal conjugate vaccine 13-valent  Other Orders - glucose blood (ACCU-CHEK SMARTVIEW) test strip; Use to check blood sugar once daily    continue current medications. Followup with specialist as scheduled. Patient is not a good candidate for colonoscopy due to significant heart failure. Recheck here in 3-4 months and sooner as  needed.

## 2014-02-26 NOTE — Progress Notes (Signed)
Pre visit review using our clinic review tool, if applicable. No additional management support is needed unless otherwise documented below in the visit note. 

## 2014-02-26 NOTE — Addendum Note (Signed)
Addended by: Bonnye Fava on: 02/26/2014 01:24 PM   Modules accepted: Orders

## 2014-03-01 ENCOUNTER — Other Ambulatory Visit: Payer: Self-pay | Admitting: Family

## 2014-03-01 MED ORDER — NITROFURANTOIN MONOHYD MACRO 100 MG PO CAPS
100.0000 mg | ORAL_CAPSULE | Freq: Two times a day (BID) | ORAL | Status: DC
Start: 2014-03-01 — End: 2015-01-18

## 2014-03-02 LAB — URINE CULTURE

## 2014-03-08 DIAGNOSIS — I509 Heart failure, unspecified: Secondary | ICD-10-CM

## 2014-03-08 DIAGNOSIS — I5022 Chronic systolic (congestive) heart failure: Secondary | ICD-10-CM

## 2014-03-08 DIAGNOSIS — Z452 Encounter for adjustment and management of vascular access device: Secondary | ICD-10-CM

## 2014-03-08 DIAGNOSIS — IMO0001 Reserved for inherently not codable concepts without codable children: Secondary | ICD-10-CM

## 2014-03-08 DIAGNOSIS — E1165 Type 2 diabetes mellitus with hyperglycemia: Secondary | ICD-10-CM

## 2014-03-12 ENCOUNTER — Encounter: Payer: Self-pay | Admitting: Internal Medicine

## 2014-03-24 ENCOUNTER — Encounter: Payer: Self-pay | Admitting: Internal Medicine

## 2014-03-25 ENCOUNTER — Encounter: Payer: Self-pay | Admitting: Anesthesiology

## 2014-04-11 ENCOUNTER — Encounter: Payer: Self-pay | Admitting: Adult Health

## 2014-04-13 ENCOUNTER — Ambulatory Visit (INDEPENDENT_AMBULATORY_CARE_PROVIDER_SITE_OTHER): Payer: Medicare Other

## 2014-04-13 ENCOUNTER — Ambulatory Visit: Payer: Medicare Other

## 2014-04-13 DIAGNOSIS — M79673 Pain in unspecified foot: Secondary | ICD-10-CM

## 2014-04-13 DIAGNOSIS — B351 Tinea unguium: Secondary | ICD-10-CM

## 2014-04-13 DIAGNOSIS — E1140 Type 2 diabetes mellitus with diabetic neuropathy, unspecified: Secondary | ICD-10-CM

## 2014-04-13 NOTE — Progress Notes (Signed)
   Subjective:    Patient ID: Gary Gallegos, male    DOB: 01/07/1946, 68 y.o.   MRN: [id number]  HPI  Pt presents for nail debridement   Review of Systems no new findings or systemic changes noted     Objective:   Physical Exam neurovascular status is intact pedal pulses DP +1 over 4 PT 2 over 4 bilateral capillary refill time 3 seconds epicritic and proprioceptive sensations decreased to the forefoot and digits DTRs not elicited dermatologically skin color pigment normal nails 1 through 5 bilateral painful tender criptotic incurvated and friable mycotic nails debrided 1 through 5 of the      Assessment & Plan:  Assessment diabetes with history of peripheral neuropathy and angiopathy nails debrided 10 return in 3 months for follow-up and continued palliative care in the future as needed next  [employer] DPM

## 2014-04-14 ENCOUNTER — Encounter: Payer: Self-pay | Admitting: Anesthesiology

## 2014-04-14 ENCOUNTER — Encounter: Payer: Self-pay | Admitting: Internal Medicine

## 2014-04-16 ENCOUNTER — Telehealth (HOSPITAL_COMMUNITY): Payer: Self-pay | Admitting: Vascular Surgery

## 2014-04-20 ENCOUNTER — Encounter: Payer: Self-pay | Admitting: Anesthesiology

## 2014-04-20 ENCOUNTER — Encounter (HOSPITAL_COMMUNITY): Payer: Medicare Other

## 2014-04-25 NOTE — Progress Notes (Signed)
Patient ID: Gary Gallegos, male   DOB: [birth year], 68 y.o.   MRN: [id number]  PCP: Dr. Roxy Cedar, FNP Primary Cardiologist: Dr. Ottie Gallegos  HPI: [name] is a 68 yo male referred to the HF clinic by Dr.Nelson. He has a history of DM2 with severe diabetic retinopathy legally blind September , cataracts HTN, HLD, chronic systolic HF due to severe, EF 10% (11/14) s/p ICD Medtronic.   Started having problems with his heart in early 2013. Was followed by cardiologist at [facility]. Had cath in 2013 and arteries were "fine".  Admitted to [facility] in January with N/V/D. EGD  Showed severe gastritis and esophagitis. Started on Milrinone 0.25 mcg via PICC. Discharge weight 133 pounds. Followed by [facility].   Follow up for Heart Failure: Returns for routine follow up. He remains of milrinone 0.25 mcg via picc.  Overall feelling good. od. Denies SOB/PND/Orthopnea. Taking extra 40 mg torsemide about twice a week. Weight at home 146-148 pounds. Able to get dressed independently. Excellent appetite. Following salt diet and drinking less, but drinking more than 2L a day. Followed [facility].  Has weekly lab work every Monday.   ECHO 05/05/13: EF 10%, diff HK, grade II DD, mild MR, LA mod dilated, RV mildly dilated and sys fx mod reduced, mod TR ECHO 11/04/13  EF 10% mod-severe MR RV mod reduced. TAPSE 1.6   Labs 07/15/13 K 5.7 Creatinine 1.95 instructed to cut lisinopril 5 mg daily.  Labs 07/22/13 K 3.6 Creatinine 0.81 Dig 1.9 digoxin stopped Labs  08/17/13 K 3.7 Creatinine 0.8 Glucose 190           08/20/13: K+ 3.7, Creatinine 0.70, Glucose 123, Na 137           10/22/13: K 3.9, creatinine 0.69, glucose 179           02/11/14: K 4.3 cr 1.2  SH: Lives with his daughter, son in law, and 2 granddaughters.     ROS: All systems negative except as listed in HPI, PMH and Problem List.  Past Medical History  Diagnosis Date  . CHF (congestive heart failure)   . Diabetes mellitus without complication   .  Hypertension   . Impaired vision in both eyes     Current Outpatient Prescriptions  Medication Sig Dispense Refill  . aspirin EC 81 MG tablet Take 81 mg by mouth daily.    . Blood Glucose Monitoring Suppl (ACCU-CHEK NANO SMARTVIEW) W/DEVICE KIT by Does not apply route.    . carvedilol (COREG) 3.125 MG tablet Take 1 tablet (3.125 mg total) by mouth 2 (two) times daily with a meal. 60 tablet 3  . glucose blood (ACCU-CHEK SMARTVIEW) test strip Use to check blood sugar once daily 100 each 12  . insulin glargine (LANTUS) 100 UNIT/ML injection Inject 5 Units into the skin at bedtime.     Marland Kitchen lisinopril (PRINIVIL,ZESTRIL) 5 MG tablet Take 1 tablet (5 mg total) by mouth daily. 30 tablet 6  . magnesium oxide (MAG-OX) 400 MG tablet Take 1 tablet (400 mg total) by mouth daily. 30 tablet 3  . milrinone (PRIMACOR) 20 MG/100ML SOLN infusion Inject 7.75 mcg/min into the vein continuous. 100 mL   . mirtazapine (REMERON) 7.5 MG tablet Take 1 tablet (7.5 mg total) by mouth at bedtime. 30 tablet 3  . nitrofurantoin, macrocrystal-monohydrate, (MACROBID) 100 MG capsule Take 1 capsule (100 mg total) by mouth 2 (two) times daily. 14 capsule 0  . ondansetron (ZOFRAN) 4 MG tablet Take 1 tablet (  4 mg total) by mouth every 6 (six) hours as needed for nausea. 20 tablet 0  . pantoprazole (PROTONIX) 20 MG tablet Take 20 mg by mouth daily.    . potassium chloride SA (K-DUR,KLOR-CON) 20 MEQ tablet Take 1 tablet (20 mEq total) by mouth 2 (two) times daily. 60 tablet 3  . sertraline (ZOLOFT) 50 MG tablet Take 50 mg by mouth daily.    . simvastatin (ZOCOR) 5 MG tablet Take 5 mg by mouth at bedtime.    . torsemide (DEMADEX) 20 MG tablet Take 60 mg by mouth daily. Take 40 mg in PM as needed     No current facility-administered medications for this encounter.    Filed Vitals:   04/26/14 0842  BP: 104/60  Pulse: 121  Resp: 18  Weight: 147 lb (66.679 kg)  SpO2: 99%    PHYSICAL EXAM: General: Chronically ill appearing. No  resp difficulty, daughter present. Ambulated into the clinic with a walker.  HEENT: normal; arcus senilus.  Neck: supple. JVP~5-6 . Carotids 2+ bilaterally; no bruits. No lymphadenopathy or thryomegaly appreciated. Cor: PMI laterally displaced.Tachy regular. No rubs. + S3; +6/9 systolic murmur  Lungs: clear Abdomen: soft, nontender, non distended. No hepatosplenomegaly. No bruits or masses. Good bowel sounds. Extremities: no cyanosis, clubbing, rash, edema; RUE picc Neuro: alert & orientedx3, cranial nerves grossly intact. Moves all 4 extremities w/o difficulty. Affect pleasant.    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 EF 10% mod-severe MR RV mod reduced. TAPSE 1.6 (10/2013) - Overall stable and doing well on milrinone  - NYHA II-III symptoms on milrinone 0.25 mcg.  Volume status stable. . Continue  torsemide to 60 mg q am and 40 mg q pm. Instructed to hold afternoon torsemide if home weight less than 139 lbs. Continue potassium to 20 meq BID.  - Continue coreg 3.125 mg BID. - Continue lisinopril 5 mg daily will not titrate due to soft BP.   - Will recheck BMET per  [facility] and weekly on Thursday. Adjust diuretics as needed.  - Reinforced the need and importance of daily weights, a low sodium diet, and fluid restriction (less than 2 L a day). Instructed to call the HF clinic if weight increases more than 3 lbs overnight or 5 lbs in a week. 2)  Diabetes with diabetic retinopathy-  Had surgery on R eye and vision is much improved. Not able to do L eye surgery currently because heart too weak. Discussed the need to continue follow blood sugars closely and to follow up with PCP.  3) Sinus Tachycardia- due to HF Not candidate for ivabradine due to shock/milrinone.  4) HTN- Stable will continue current medications.  5) DM- Per PCP. Improving better controlled   Follow up in 3 months  Takyla Kuchera NP-C   8:56 AM

## 2014-04-26 ENCOUNTER — Ambulatory Visit (HOSPITAL_COMMUNITY)
Admission: RE | Admit: 2014-04-26 | Discharge: 2014-04-26 | Disposition: A | Discharge status: Home / Self Care | Payer: Medicare Other | Source: Ambulatory Visit | Attending: Cardiology | Admitting: Cardiology

## 2014-04-26 VITALS — BP 104/60 | HR 121 | Resp 18 | Wt 147.0 lb

## 2014-04-26 DIAGNOSIS — H548 Legal blindness, as defined in USA: Secondary | ICD-10-CM | POA: Diagnosis not present

## 2014-04-26 DIAGNOSIS — Z7982 Long term (current) use of aspirin: Secondary | ICD-10-CM | POA: Diagnosis not present

## 2014-04-26 DIAGNOSIS — Z95810 Presence of automatic (implantable) cardiac defibrillator: Secondary | ICD-10-CM | POA: Insufficient documentation

## 2014-04-26 DIAGNOSIS — I5022 Chronic systolic (congestive) heart failure: Secondary | ICD-10-CM | POA: Insufficient documentation

## 2014-04-26 DIAGNOSIS — E11319 Type 2 diabetes mellitus with unspecified diabetic retinopathy without macular edema: Secondary | ICD-10-CM | POA: Diagnosis not present

## 2014-04-26 DIAGNOSIS — R000 Tachycardia, unspecified: Secondary | ICD-10-CM | POA: Diagnosis not present

## 2014-04-26 DIAGNOSIS — I471 Supraventricular tachycardia: Secondary | ICD-10-CM

## 2014-04-26 DIAGNOSIS — H269 Unspecified cataract: Secondary | ICD-10-CM | POA: Diagnosis not present

## 2014-04-26 DIAGNOSIS — I10 Essential (primary) hypertension: Secondary | ICD-10-CM | POA: Diagnosis not present

## 2014-04-26 DIAGNOSIS — Z794 Long term (current) use of insulin: Secondary | ICD-10-CM | POA: Insufficient documentation

## 2014-04-26 NOTE — Telephone Encounter (Signed)
OPEN IN ERROR 

## 2014-04-26 NOTE — Patient Instructions (Signed)
Follow up 3 months.  Happy Holidays!  Do the following things EVERYDAY: 1) Weigh yourself in the morning before breakfast. Write it down and keep it in a log. 2) Take your medicines as prescribed 3) Eat low salt foods-Limit salt (sodium) to 2000 mg per day.  4) Stay as active as you can everyday 5) Limit all fluids for the day to less than 2 liters 6)

## 2014-04-27 ENCOUNTER — Telehealth (HOSPITAL_COMMUNITY): Payer: Self-pay | Admitting: Vascular Surgery

## 2014-04-27 NOTE — Telephone Encounter (Signed)
Pt is on Milronone.Gary Gallegos needs recert orders.. Please advise

## 2014-04-27 NOTE — Telephone Encounter (Signed)
Spoke w/Beth gave order for re-cert

## 2014-05-04 ENCOUNTER — Encounter: Payer: Self-pay | Admitting: Internal Medicine

## 2014-05-24 ENCOUNTER — Encounter: Payer: Self-pay | Admitting: Internal Medicine

## 2014-05-27 ENCOUNTER — Encounter: Payer: Self-pay | Admitting: Internal Medicine

## 2014-05-27 ENCOUNTER — Encounter (HOSPITAL_COMMUNITY): Payer: Self-pay | Admitting: Cardiology

## 2014-06-13 ENCOUNTER — Other Ambulatory Visit (HOSPITAL_COMMUNITY): Payer: Self-pay | Admitting: Cardiology

## 2014-06-13 ENCOUNTER — Other Ambulatory Visit: Payer: Self-pay | Admitting: Family

## 2014-06-14 ENCOUNTER — Encounter: Payer: Self-pay | Admitting: Internal Medicine

## 2014-06-21 ENCOUNTER — Telehealth (HOSPITAL_COMMUNITY): Payer: Self-pay | Admitting: Vascular Surgery

## 2014-06-21 NOTE — Telephone Encounter (Signed)
[facility]urse from [facility] called , they drew weekly labs, pt is doing very well, .Gary Gallegos

## 2014-06-28 ENCOUNTER — Telehealth (HOSPITAL_COMMUNITY): Payer: Self-pay | Admitting: Vascular Surgery

## 2014-06-28 NOTE — Telephone Encounter (Signed)
recert orders 

## 2014-06-28 NOTE — Telephone Encounter (Signed)
Spoke w/RN gave orders to recert pt

## 2014-06-29 ENCOUNTER — Encounter: Payer: Self-pay | Admitting: Internal Medicine

## 2014-07-02 IMAGING — US IR FLUORO GUIDE CV LINE*R*
1 series · 1 of 1 positions shown · non-contrast
Comparison: none

CLINICAL DATA: Patient with CHF, need IV access for longterm
milrinone, previous PICC fell out.

[Series 1: ir fluoro guide cv line*right* · 1 of 1 slices shown]
[im 1/1]
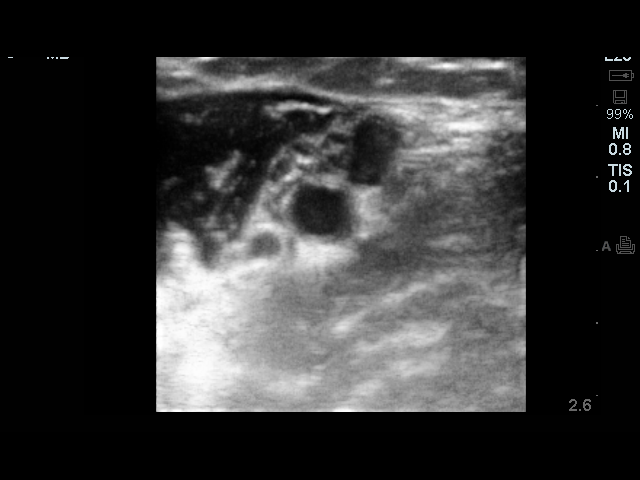

[1 of 1 positions shown; findings below may reference images not displayed]

EXAM:
Dual lumen power injectable PICC LINE PLACEMENT WITH ULTRASOUND AND
FLUOROSCOPIC GUIDANCE

FLUOROSCOPY TIME:  1.5 minutes

PROCEDURE:
The patient was advised of the possible risks and complications and
agreed to undergo the procedure. The patient was then brought to the
angiographic suite for the procedure.

The right arm was prepped with chlorhexidine, draped in the usual
sterile fashion using maximum barrier technique (cap and mask,
sterile gown, sterile gloves, large sterile sheet, hand hygiene and
cutaneous antisepsis) and infiltrated locally with 1% Lidocaine.

Ultrasound demonstrated patency of the right basilic vein, and this
was documented with an image. Under real-time ultrasound guidance,
this vein was accessed with a 21 gauge micropuncture needle and
image documentation was performed. A [DATE] wire was introduced in to
the vein. Over this, a 5 French dual lumen power-injectable PICC was
advanced to the lower SVC/right atrial junction. Fluoroscopy during
the procedure and fluoro spot radiograph confirms appropriate
catheter position. The catheter was flushed and covered with a
sterile dressing.

Complications: None.
IMPRESSION: Successful right arm Power injectable PICC line placement with
ultrasound and fluoroscopic guidance. The catheter is ready for use.

## 2014-07-08 ENCOUNTER — Telehealth (HOSPITAL_COMMUNITY): Payer: Self-pay | Admitting: *Deleted

## 2014-07-08 NOTE — Telephone Encounter (Signed)
Received labs from [facility], K 5.4 bun 19 cr 0.96, per [name], NP have pt decrease KCL to 20 meq daily, have attempted to call pt 1/20 and 1/21 however VM states pt not available and no VM has been set up, will continue to try and reach pt

## 2014-07-12 ENCOUNTER — Telehealth (HOSPITAL_COMMUNITY): Payer: Self-pay | Admitting: Vascular Surgery

## 2014-07-12 ENCOUNTER — Other Ambulatory Visit (HOSPITAL_COMMUNITY): Payer: Self-pay

## 2014-07-12 MED ORDER — POTASSIUM CHLORIDE CRYS ER 20 MEQ PO TBCR: 20.0000 meq | EXTENDED_RELEASE_TABLET | Freq: Every day | ORAL | Status: DC

## 2014-07-12 NOTE — Telephone Encounter (Signed)
RN from [facility] called pt PICC is not giving blood return pt has an appointment with the clinic on 1/28

## 2014-07-12 NOTE — Telephone Encounter (Signed)
Patients daughter made aware of lab results and decrease in potassium to once daily.  Ave Filter

## 2014-07-13 ENCOUNTER — Ambulatory Visit: Payer: Medicare Other

## 2014-07-15 ENCOUNTER — Ambulatory Visit (HOSPITAL_COMMUNITY)
Admission: RE | Admit: 2014-07-15 | Discharge: 2014-07-15 | Disposition: A | Discharge status: Home / Self Care | Payer: Medicare Other | Source: Ambulatory Visit | Attending: Cardiology | Admitting: Cardiology

## 2014-07-15 ENCOUNTER — Encounter (HOSPITAL_COMMUNITY): Payer: Self-pay

## 2014-07-15 VITALS — BP 92/72 | HR 108 | Wt 137.0 lb

## 2014-07-15 DIAGNOSIS — Z79899 Other long term (current) drug therapy: Secondary | ICD-10-CM | POA: Diagnosis not present

## 2014-07-15 DIAGNOSIS — E11319 Type 2 diabetes mellitus with unspecified diabetic retinopathy without macular edema: Secondary | ICD-10-CM | POA: Diagnosis not present

## 2014-07-15 DIAGNOSIS — Z794 Long term (current) use of insulin: Secondary | ICD-10-CM | POA: Insufficient documentation

## 2014-07-15 DIAGNOSIS — I5042 Chronic combined systolic (congestive) and diastolic (congestive) heart failure: Secondary | ICD-10-CM | POA: Diagnosis not present

## 2014-07-15 DIAGNOSIS — R000 Tachycardia, unspecified: Secondary | ICD-10-CM | POA: Diagnosis not present

## 2014-07-15 DIAGNOSIS — Z7982 Long term (current) use of aspirin: Secondary | ICD-10-CM | POA: Diagnosis not present

## 2014-07-15 DIAGNOSIS — E785 Hyperlipidemia, unspecified: Secondary | ICD-10-CM | POA: Insufficient documentation

## 2014-07-15 DIAGNOSIS — I5022 Chronic systolic (congestive) heart failure: Secondary | ICD-10-CM

## 2014-07-15 DIAGNOSIS — H548 Legal blindness, as defined in USA: Secondary | ICD-10-CM | POA: Insufficient documentation

## 2014-07-15 DIAGNOSIS — Z95810 Presence of automatic (implantable) cardiac defibrillator: Secondary | ICD-10-CM | POA: Diagnosis not present

## 2014-07-15 DIAGNOSIS — I10 Essential (primary) hypertension: Secondary | ICD-10-CM | POA: Insufficient documentation

## 2014-07-15 NOTE — Progress Notes (Signed)
Patient ID: Gary Gallegos, male   DOB: 1945/07/10, 69 y.o.   MRN: [id number]  PCP: Dr. Roxy Cedar, FNP Primary Cardiologist: Dr. Ottie Glazier  HPI: Gary Gallegos is a 69 yo male referred to the HF clinic by Dr.Nelson. He has a history of DM2 with severe diabetic retinopathy legally blind September , cataracts HTN, HLD, chronic systolic HF due to severe, EF 10% (11/14) s/p ICD Medtronic.   Started having problems with his heart in early 2013. Was followed by cardiologist at [facility]. Had cath in 2013 and arteries were "fine".  Admitted to [facility] in January with N/V/D. EGD  Showed severe gastritis and esophagitis. Started on Milrinone 0.25 mcg via PICC. Discharge weight 133 pounds. Followed by [facility].   Follow up for Heart Failure: Returns for routine follow up. He remains of milrinone 0.25 mcg via picc.  Overall feels great. Able to walk up 2 flights of stairs.  Denies SOB/PND/Orthopnea. Weight at home 138-140 pounds. Able to get dressed independently. Excellent appetite. Following salt diet and drinking less, but drinking more than 2L a day. Followed [facility].  Has weekly lab work every Monday.   ECHO 05/05/13: EF 10%, diff HK, grade II DD, mild MR, LA mod dilated, RV mildly dilated and sys fx mod reduced, mod TR ECHO 11/04/13  EF 10% mod-severe MR RV mod reduced. TAPSE 1.6   Labs 07/15/13 K 5.7 Creatinine 1.95 instructed to cut lisinopril 5 mg daily.  Labs 07/22/13 K 3.6 Creatinine 0.81 Dig 1.9 digoxin stopped Labs  08/17/13 K 3.7 Creatinine 0.8 Glucose 190           08/20/13: K+ 3.7, Creatinine 0.70, Glucose 123, Na 137           10/22/13: K 3.9, creatinine 0.69, glucose 179           02/11/14: K 4.3 cr 1.2           07/05/2014: K 5.4 Creatinine 0.96 potassium cut back to 20 meq once a day.            07/12/2014: K 4.4 Creatinine 0.95   SH: Lives with his daughter, son in law, and 2 granddaughters.     ROS: All systems negative except as listed in HPI, PMH and Problem List.  Past Medical  History  Diagnosis Date  . CHF (congestive heart failure)   . Diabetes mellitus without complication   . Hypertension   . Impaired vision in both eyes     Current Outpatient Prescriptions  Medication Sig Dispense Refill  . aspirin EC 81 MG tablet Take 81 mg by mouth daily.    . Blood Glucose Monitoring Suppl (ACCU-CHEK NANO SMARTVIEW) W/DEVICE KIT by Does not apply route.    . carvedilol (COREG) 3.125 MG tablet TAKE 1 TABLET BY MOUTH TWO TIMES DAILY WITH A MEAL 60 tablet 1  . glucose blood (ACCU-CHEK SMARTVIEW) test strip Use to check blood sugar once daily 100 each 12  . insulin glargine (LANTUS) 100 UNIT/ML injection Inject 5 Units into the skin at bedtime.     Marland Kitchen lisinopril (PRINIVIL,ZESTRIL) 5 MG tablet Take 1 tablet (5 mg total) by mouth daily. 30 tablet 6  . magnesium oxide (MAG-OX) 400 MG tablet Take 1 tablet (400 mg total) by mouth daily. 30 tablet 3  . milrinone (PRIMACOR) 20 MG/100ML SOLN infusion Inject 7.75 mcg/min into the vein continuous. 100 mL   . mirtazapine (REMERON) 7.5 MG tablet Take 1 tablet (7.5 mg total) by mouth at bedtime.  30 tablet 3  . nitrofurantoin, macrocrystal-monohydrate, (MACROBID) 100 MG capsule Take 1 capsule (100 mg total) by mouth 2 (two) times daily. 14 capsule 0  . ondansetron (ZOFRAN) 4 MG tablet Take 1 tablet (4 mg total) by mouth every 6 (six) hours as needed for nausea. 20 tablet 0  . pantoprazole (PROTONIX) 20 MG tablet take 1 tablet by mouth once daily 90 tablet 1  . potassium chloride SA (K-DUR,KLOR-CON) 20 MEQ tablet Take 1 tablet (20 mEq total) by mouth daily. 60 tablet 3  . sertraline (ZOLOFT) 50 MG tablet Take 50 mg by mouth daily.    . simvastatin (ZOCOR) 5 MG tablet Take 5 mg by mouth at bedtime.    . torsemide (DEMADEX) 20 MG tablet Take 60 mg by mouth daily. Take 40 mg in PM as needed     No current facility-administered medications for this encounter.    Filed Vitals:   07/15/14 1342  BP: 92/72  Pulse: 108  Weight: 137 lb (62.143  kg)  SpO2: 100%    PHYSICAL EXAM: General: Chronically ill appearing. No resp difficulty, daughter present. Ambulated into the clinic with a walker.  HEENT: normal; arcus senilus.  Neck: supple. JVP~5-6 . Carotids 2+ bilaterally; no bruits. No lymphadenopathy or thryomegaly appreciated. Cor: PMI laterally displaced.Tachy regular. No rubs. + S3; +1/6 systolic murmur  Lungs: clear Abdomen: soft, nontender, non distended. No hepatosplenomegaly. No bruits or masses. Good bowel sounds. Extremities: no cyanosis, clubbing, rash, edema; RUE picc Neuro: alert & orientedx3, cranial nerves grossly intact. Moves all 4 extremities w/o difficulty. Affect pleasant.    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 EF 10% mod-severe MR RV mod reduced. TAPSE 1.6 (10/2013) - Overall stable and doing well on milrinone  - NYHA II-III symptoms on milrinone 0.25 mcg.  Volume status stable. . Continue  torsemide to 60 mg q am and 40 mg q pm. Instructed to hold afternoon torsemide if home weight less than 139 lbs. Continue potassium to 20 meq BID.  - Continue coreg 3.125 mg BID. - Continue lisinopril 5 mg daily will not titrate due to soft BP.   - Will recheck BMET per  [facility] weekly.  - Reinforced the need and importance of daily weights, a low sodium diet, and fluid restriction (less than 2 L a day). Instructed to call the HF clinic if weight increases more than 3 lbs overnight or 5 lbs in a week. 2)  Diabetes with diabetic retinopathy-  Had surgery on R eye and vision is much improved. Not able to do L eye surgery currently because heart too weak. Discussed the need to continue follow blood sugars closely and to follow up with PCP.  3) Sinus Tachycardia- due to HF Not candidate for ivabradine due to shock/milrinone.  4) HTN- Stable will continue current medications.  5) DM- Per PCP. Improving better controlled   Follow up in 3 months  CLEGG,AMY  NP-C   1:59 PM  Patient seen and examined with Darrick Grinder, NP. We discussed all aspects of the encounter. I agree with the assessment and plan as stated above.   He is much improved on home milrinone and doing very well. Not candidate for other advanced therapies at this point. Will continue inotropes. Volume status looks good. Reinforced need for daily weights and reviewed use of sliding scale diuretics.  Daniel Bensimhon,MD 9:09 PM

## 2014-07-15 NOTE — Patient Instructions (Signed)
Follow up in 3 months

## 2014-07-16 ENCOUNTER — Telehealth (HOSPITAL_COMMUNITY): Payer: Self-pay | Admitting: Vascular Surgery

## 2014-07-16 NOTE — Telephone Encounter (Signed)
[facility] called pt PICC line is not working for lab draws.. Pt does not like getting stuck pt is suppose to get labs on Monday.. Nurse wants to send him somewhere today to get it fixed.Marland Kitchen Please advise

## 2014-07-20 NOTE — Telephone Encounter (Signed)
Nurse was able to get blood yesterday

## 2014-07-23 ENCOUNTER — Telehealth (HOSPITAL_COMMUNITY): Payer: Self-pay | Admitting: Vascular Surgery

## 2014-07-23 NOTE — Telephone Encounter (Addendum)
Pt daughter called his PICC line is out they live in [place] they are at [facility], the doctor at [facility] wants to do something more permanent pt daughter wants to get the OK from Amy or Dan.Marland Kitchen Ahe would like a call ASAP Please advise pt daughter called back again please advise ASAP

## 2014-07-23 NOTE — Telephone Encounter (Signed)
Patient pulled out PICC line, lives in [place].  [place] to [place] to be treated and new line placed, however they had multiple attempts to place new PICC without success as line kept coiling and optimal position could not be obtained.  MD at [facility] recommended [place], daughter wanted to know if we agreed to this.  Per [name] NP-C, this is appropriate for patient.  Daughter made aware and appreciative.  Ave Filter

## 2014-07-26 ENCOUNTER — Encounter: Payer: Self-pay | Admitting: Internal Medicine

## 2014-07-26 ENCOUNTER — Telehealth (HOSPITAL_COMMUNITY): Payer: Self-pay | Admitting: Vascular Surgery

## 2014-07-26 NOTE — Telephone Encounter (Signed)
Nurse called pt is doing well pt got a 1 luman [place], could not get blood because medicine is running through it.. Just FYI

## 2014-07-27 ENCOUNTER — Telehealth (HOSPITAL_COMMUNITY): Payer: Self-pay

## 2014-07-27 MED ORDER — POTASSIUM CHLORIDE CRYS ER 20 MEQ PO TBCR: 20.0000 meq | EXTENDED_RELEASE_TABLET | Freq: Two times a day (BID) | ORAL | Status: DC

## 2014-07-27 NOTE — Telephone Encounter (Signed)
Attempted to reach patient/daughter x 2 today regarding recent lab work.  07/15/14 serum potassium 3.4, per Amy Clegg NP-C increase potassium to twice daily.  Left detailed message and updated Rx to preferred pharmacy electronically.  Asked to return our call to ensure they received this message.  Ave Filter

## 2014-07-29 ENCOUNTER — Telehealth (HOSPITAL_COMMUNITY): Payer: Self-pay | Admitting: Vascular Surgery

## 2014-07-29 NOTE — Telephone Encounter (Signed)
Daughter [name] states she ran out of his torsemide and was not able to refill it until today.  Missed two days.  Advised to resume as prescribed and if he needs to he can take the extra 40mg  in afternoon as states in orders.  Patient not otherwise compromised, no swelling, SOB, or any other s/s of CHF exacerbation.  Aware and agreeable.  ,Ave Filter

## 2014-07-29 NOTE — Telephone Encounter (Signed)
[facility] daughter has been holding fluid pills, she told nurse  That Jesusita Oka said it was ok to hold it sometimes because pt has been going to the bathroom a lot through the night...  Pt  gained 4.5 lbs

## 2014-08-01 ENCOUNTER — Other Ambulatory Visit: Payer: Self-pay | Admitting: Family

## 2014-08-10 ENCOUNTER — Encounter: Payer: Self-pay | Admitting: Internal Medicine

## 2014-08-26 ENCOUNTER — Other Ambulatory Visit (HOSPITAL_COMMUNITY): Payer: Self-pay | Admitting: *Deleted

## 2014-08-26 MED ORDER — CARVEDILOL 3.125 MG PO TABS: ORAL_TABLET | ORAL | Status: DC

## 2014-08-27 ENCOUNTER — Telehealth (HOSPITAL_COMMUNITY): Payer: Self-pay | Admitting: Vascular Surgery

## 2014-08-27 NOTE — Telephone Encounter (Signed)
Nurse from [facility] called she would like recert orders

## 2014-08-27 NOTE — Telephone Encounter (Signed)
Attempted to call to give VO for recert orders but number is disconnected.  Ave Filter

## 2014-09-06 ENCOUNTER — Encounter: Payer: Self-pay | Admitting: Internal Medicine

## 2014-09-08 ENCOUNTER — Other Ambulatory Visit: Payer: Self-pay | Admitting: Family

## 2014-09-17 ENCOUNTER — Encounter: Payer: Self-pay | Admitting: Internal Medicine

## 2014-09-21 ENCOUNTER — Telehealth (HOSPITAL_COMMUNITY): Payer: Self-pay | Admitting: Cardiology

## 2014-09-21 DIAGNOSIS — I5022 Chronic systolic (congestive) heart failure: Secondary | ICD-10-CM

## 2014-09-21 MED ORDER — MAGNESIUM OXIDE 400 MG PO TABS: 200.0000 mg | ORAL_TABLET | Freq: Every day | ORAL | Status: DC

## 2014-09-21 NOTE — Telephone Encounter (Signed)
LAB RESULTS date reported 09/15/14 Mag- 2.4 Per Dr.McLean decrease MagOX to 200mg  daily   Daughter aware and voiced understanding

## 2014-10-04 ENCOUNTER — Other Ambulatory Visit (HOSPITAL_COMMUNITY): Payer: Self-pay | Admitting: *Deleted

## 2014-10-04 MED ORDER — LISINOPRIL 5 MG PO TABS: 5.0000 mg | ORAL_TABLET | Freq: Every day | ORAL | Status: DC

## 2014-10-12 ENCOUNTER — Encounter: Payer: Self-pay | Admitting: Internal Medicine

## 2014-10-12 ENCOUNTER — Encounter (HOSPITAL_COMMUNITY): Payer: Self-pay

## 2014-10-12 ENCOUNTER — Ambulatory Visit (HOSPITAL_COMMUNITY)
Admission: RE | Admit: 2014-10-12 | Discharge: 2014-10-12 | Disposition: A | Discharge status: Home / Self Care | Payer: Medicare Other | Source: Ambulatory Visit | Attending: Internal Medicine | Admitting: Internal Medicine

## 2014-10-12 VITALS — BP 70/50 | HR 60 | Resp 20 | Wt 151.8 lb

## 2014-10-12 DIAGNOSIS — I5042 Chronic combined systolic (congestive) and diastolic (congestive) heart failure: Secondary | ICD-10-CM | POA: Diagnosis not present

## 2014-10-12 DIAGNOSIS — H548 Legal blindness, as defined in USA: Secondary | ICD-10-CM | POA: Diagnosis not present

## 2014-10-12 DIAGNOSIS — Z7982 Long term (current) use of aspirin: Secondary | ICD-10-CM | POA: Insufficient documentation

## 2014-10-12 DIAGNOSIS — I5022 Chronic systolic (congestive) heart failure: Secondary | ICD-10-CM | POA: Diagnosis not present

## 2014-10-12 DIAGNOSIS — E785 Hyperlipidemia, unspecified: Secondary | ICD-10-CM | POA: Diagnosis not present

## 2014-10-12 DIAGNOSIS — Z79899 Other long term (current) drug therapy: Secondary | ICD-10-CM | POA: Diagnosis not present

## 2014-10-12 DIAGNOSIS — R000 Tachycardia, unspecified: Secondary | ICD-10-CM | POA: Diagnosis not present

## 2014-10-12 DIAGNOSIS — E11319 Type 2 diabetes mellitus with unspecified diabetic retinopathy without macular edema: Secondary | ICD-10-CM | POA: Insufficient documentation

## 2014-10-12 DIAGNOSIS — I10 Essential (primary) hypertension: Secondary | ICD-10-CM | POA: Insufficient documentation

## 2014-10-12 DIAGNOSIS — Z95810 Presence of automatic (implantable) cardiac defibrillator: Secondary | ICD-10-CM | POA: Diagnosis not present

## 2014-10-12 MED ORDER — TORSEMIDE 20 MG PO TABS: 40.0000 mg | ORAL_TABLET | Freq: Every day | ORAL | Status: DC

## 2014-10-12 NOTE — Patient Instructions (Signed)
STOP Lisinopril DECREASE Torsemide to 40 mg in the AM and 20 mg in the PM as needed for weight gain  Your physician recommends that you schedule a follow-up appointment in: 2-3 weeks with [name], NP  Do the following things EVERYDAY: 1) Weigh yourself in the morning before breakfast. Write it down and keep it in a log. 2) Take your medicines as prescribed 3) Eat low salt foods-Limit salt (sodium) to 2000 mg per day.  4) Stay as active as you can everyday 5) Limit all fluids for the day to less than 2 liters 6)

## 2014-10-12 NOTE — Progress Notes (Signed)
Patient ID: Gary Gallegos, male   DOB: 1945-12-30, 69 y.o.   MRN: [id number]  PCP: Dr. Roxy Cedar, FNP Primary Cardiologist: Dr. Ottie Glazier  HPI: Gary Gallegos is a 69 yo male referred to the HF clinic by Dr.Nelson. He has a history of DM2 with severe diabetic retinopathy legally blind September , cataracts HTN, HLD, chronic systolic HF due to severe, EF 10% (11/14) s/p ICD Medtronic.   Started having problems with his heart in early 2013. Was followed by cardiologist at [facility]. Had cath in 2013 and arteries were "fine".  Admitted to [facility] in January with N/V/D. EGD  Showed severe gastritis and esophagitis. Started on Milrinone 0.25 mcg via PICC. Discharge weight 133 pounds. Followed by [facility].   Follow up for Heart Failure: Returns for routine follow up. He remains of milrinone 0.25 mcg via picc.  Overall feels great. Able to walk up 1 flight of stairs then take a break.  Denies SOB/PND/Orthopnea. Complains of leg fatigue. Weight at home 148-150s.  Able to get dressed independently. Excellent appetite. Following salt diet and drinking more than 2L a day. Followed [facility].  Has weekly lab work every Monday.   ECHO 05/05/13: EF 10%, diff HK, grade II DD, mild MR, LA mod dilated, RV mildly dilated and sys fx mod reduced, mod TR ECHO 11/04/13  EF 10% mod-severe MR RV mod reduced. TAPSE 1.6   Labs 07/15/13 K 5.7 Creatinine 1.95 instructed to cut lisinopril 5 mg daily.  Labs 07/22/13 K 3.6 Creatinine 0.81 Dig 1.9 digoxin stopped Labs  08/17/13 K 3.7 Creatinine 0.8 Glucose 190           08/20/13: K+ 3.7, Creatinine 0.70, Glucose 123, Na 137           10/22/13: K 3.9, creatinine 0.69, glucose 179           02/11/14: K 4.3 cr 1.2           07/05/2014: K 5.4 Creatinine 0.96 potassium cut back to 20 meq once a day.            07/12/2014: K 4.4 Creatinine 0.95   SH: Lives with his daughter, son in law, and 2 granddaughters.     ROS: All systems negative except as listed in HPI, PMH and Problem  List.  Past Medical History  Diagnosis Date  . CHF (congestive heart failure)   . Diabetes mellitus without complication   . Hypertension   . Impaired vision in both eyes     Current Outpatient Prescriptions  Medication Sig Dispense Refill  . aspirin EC 81 MG tablet Take 81 mg by mouth daily.    . Blood Glucose Monitoring Suppl (ACCU-CHEK NANO SMARTVIEW) W/DEVICE KIT by Does not apply route.    . carvedilol (COREG) 3.125 MG tablet TAKE 1 TABLET BY MOUTH TWO TIMES DAILY WITH A MEAL 60 tablet 1  . glucose blood (ACCU-CHEK SMARTVIEW) test strip Use to check blood sugar once daily 100 each 12  . insulin glargine (LANTUS) 100 UNIT/ML injection Inject 5 Units into the skin at bedtime.     Marland Kitchen lisinopril (PRINIVIL,ZESTRIL) 5 MG tablet Take 1 tablet (5 mg total) by mouth daily. 30 tablet 6  . milrinone (PRIMACOR) 20 MG/100ML SOLN infusion Inject 7.75 mcg/min into the vein continuous. 100 mL   . mirtazapine (REMERON) 7.5 MG tablet take 1 tablet by mouth at bedtime 90 tablet 0  . nitrofurantoin, macrocrystal-monohydrate, (MACROBID) 100 MG capsule Take 1 capsule (100 mg total) by  mouth 2 (two) times daily. 14 capsule 0  . ondansetron (ZOFRAN) 4 MG tablet Take 1 tablet (4 mg total) by mouth every 6 (six) hours as needed for nausea. 20 tablet 0  . pantoprazole (PROTONIX) 20 MG tablet take 1 tablet by mouth once daily 30 tablet 5  . potassium chloride SA (K-DUR,KLOR-CON) 20 MEQ tablet Take 1 tablet (20 mEq total) by mouth 2 (two) times daily. 60 tablet 3  . sertraline (ZOLOFT) 50 MG tablet Take 50 mg by mouth daily.    . simvastatin (ZOCOR) 5 MG tablet Take 5 mg by mouth at bedtime.    . torsemide (DEMADEX) 20 MG tablet Take 60 mg by mouth daily. Take 40 mg in PM as needed     No current facility-administered medications for this encounter.    Filed Vitals:   10/12/14 1428  BP: 70/50  Pulse: 60  Resp: 20  Weight: 151 lb 12 oz (68.833 kg)  SpO2: 99%    PHYSICAL EXAM: General: Chronically ill  appearing. No resp difficulty, daughter present. Ambulated into the clinic with a walker.  HEENT: normal; arcus senilus.  Neck: supple. JVP~5-6 . Carotids 2+ bilaterally; no bruits. No lymphadenopathy or thryomegaly appreciated. Cor: PMI laterally displaced.Tachy regular. No rubs. + S3; +6/3 systolic murmur   Lungs: clear Abdomen: soft, nontender, non distended. No hepatosplenomegaly. No bruits or masses. Good bowel sounds. Extremities: no cyanosis, clubbing, rash, edema; RUE picc Neuro: alert & orientedx3, cranial nerves grossly intact. Moves all 4 extremities w/o difficulty. Affect pleasant.    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 EF 10% mod-severe MR RV mod reduced. TAPSE 1.6 (10/2013) - Overall stable and doing well on milrinone. - NYHA II-III symptoms on milrinone 0.25 mcg.  Volume status low. Cut back torsemide to 40 mg  Daily with extra 20 mg for weight gain.     Continue potassium to 20 meq BID.  - Continue coreg 3.125 mg BID. -Stop lisinopril - Will recheck BMET per  [facility] weekly.  - Reinforced the need and importance of daily weights, a low sodium diet, and fluid restriction (less than 2 L a day). Instructed to call the HF clinic if weight increases more than 3 lbs overnight or 5 lbs in a week. 2)  Diabetes with diabetic retinopathy-  Had surgery on R eye and vision is much improved. Not able to do L eye surgery currently because heart too weak. Discussed the need to continue follow blood sugars closely and to follow up with PCP.  3) Sinus Tachycardia- due to HF Not candidate for ivabradine due to shock/milrinone.  4) HTN- Stable will continue current medications.  5) DM- Per PCP. Improving better controlled   Follow up in 3 months  CLEGG,[name]   2:42 PM  Patient seen and examined with Darrick Grinder, NP. We discussed all aspects of the encounter. I agree with the assessment and plan as stated above.    Continues to feel well on milrinone but BP low today. ICD interrogated personally in clinic and Optivol way down. No VT/ AF. Continue milrinone. Stop lisinopril. Decrease torsemide to 50m daily - can take extra 20 as needed. See back in 2 weeks.  DBenay Spice6:17 PM

## 2014-11-02 ENCOUNTER — Other Ambulatory Visit (HOSPITAL_COMMUNITY): Payer: Self-pay | Admitting: *Deleted

## 2014-11-02 MED ORDER — CARVEDILOL 3.125 MG PO TABS: ORAL_TABLET | ORAL | Status: DC

## 2014-11-09 ENCOUNTER — Encounter: Payer: Self-pay | Admitting: Internal Medicine

## 2014-11-10 ENCOUNTER — Ambulatory Visit (HOSPITAL_COMMUNITY)
Admission: RE | Admit: 2014-11-10 | Discharge: 2014-11-10 | Disposition: A | Discharge status: Home / Self Care | Payer: Medicare Other | Source: Ambulatory Visit | Attending: Internal Medicine | Admitting: Internal Medicine

## 2014-11-10 VITALS — BP 94/66 | HR 111 | Wt 153.0 lb

## 2014-11-10 DIAGNOSIS — Z794 Long term (current) use of insulin: Secondary | ICD-10-CM | POA: Diagnosis not present

## 2014-11-10 DIAGNOSIS — Z95810 Presence of automatic (implantable) cardiac defibrillator: Secondary | ICD-10-CM | POA: Insufficient documentation

## 2014-11-10 DIAGNOSIS — I429 Cardiomyopathy, unspecified: Secondary | ICD-10-CM | POA: Insufficient documentation

## 2014-11-10 DIAGNOSIS — Z7982 Long term (current) use of aspirin: Secondary | ICD-10-CM | POA: Insufficient documentation

## 2014-11-10 DIAGNOSIS — I5042 Chronic combined systolic (congestive) and diastolic (congestive) heart failure: Secondary | ICD-10-CM | POA: Diagnosis not present

## 2014-11-10 DIAGNOSIS — R000 Tachycardia, unspecified: Secondary | ICD-10-CM | POA: Insufficient documentation

## 2014-11-10 DIAGNOSIS — I10 Essential (primary) hypertension: Secondary | ICD-10-CM | POA: Insufficient documentation

## 2014-11-10 DIAGNOSIS — I5022 Chronic systolic (congestive) heart failure: Secondary | ICD-10-CM | POA: Diagnosis not present

## 2014-11-10 DIAGNOSIS — H548 Legal blindness, as defined in USA: Secondary | ICD-10-CM | POA: Insufficient documentation

## 2014-11-10 DIAGNOSIS — E11319 Type 2 diabetes mellitus with unspecified diabetic retinopathy without macular edema: Secondary | ICD-10-CM | POA: Diagnosis not present

## 2014-11-10 DIAGNOSIS — I159 Secondary hypertension, unspecified: Secondary | ICD-10-CM | POA: Diagnosis not present

## 2014-11-10 DIAGNOSIS — E785 Hyperlipidemia, unspecified: Secondary | ICD-10-CM | POA: Diagnosis not present

## 2014-11-10 DIAGNOSIS — I471 Supraventricular tachycardia: Secondary | ICD-10-CM

## 2014-11-10 DIAGNOSIS — Z79899 Other long term (current) drug therapy: Secondary | ICD-10-CM | POA: Diagnosis not present

## 2014-11-10 NOTE — Progress Notes (Signed)
Patient ID: Gary Gallegos, male   DOB: 1946-02-27, 69 y.o.   MRN: [id number]  PCP: Dr. Roxy Cedar, FNP Primary Cardiologist: Dr. Ottie Glazier  HPI: Mr. Manor is a 69 yo male referred to the HF clinic by Dr.Nelson. He has a history of DM2 with severe diabetic retinopathy legally blind September , cataracts HTN, HLD, chronic systolic HF due to severe, EF 10% (11/14) s/p ICD Medtronic.   Started having problems with his heart in early 2013. Was followed by cardiologist at [facility]. Had cath in 2013 and arteries were "fine".  Admitted to [facility] in January with N/V/D. EGD  Showed severe gastritis and esophagitis. Started on Milrinone 0.25 mcg via PICC. Discharge weight 133 pounds. Followed by [facility].   Follow up for Heart Failure: Last visit lisinopril was stopped.  He remains of milrinone 0.25 mcg via picc.  Overall feels pretty good. Denies SOB/PND/Orthopnea. Can walk up steps. But needs a break. Weight at home 148-150s.  Able to get dressed independently. Excellent appetite. Following salt diet and drinking more than 2L a day. Followed [facility].  Has weekly lab work every Monday.   ECHO 05/05/13: EF 10%, diff HK, grade II DD, mild MR, LA mod dilated, RV mildly dilated and sys fx mod reduced, mod TR ECHO 11/04/13  EF 10% mod-severe MR RV mod reduced. TAPSE 1.6   Labs 07/15/13 K 5.7 Creatinine 1.95 instructed to cut lisinopril 5 mg daily.  Labs 07/22/13 K 3.6 Creatinine 0.81 Dig 1.9 digoxin stopped Labs  08/17/13 K 3.7 Creatinine 0.8 Glucose 190           08/20/13: K+ 3.7, Creatinine 0.70, Glucose 123, Na 137           10/22/13: K 3.9, creatinine 0.69, glucose 179           02/11/14: K 4.3 cr 1.2           07/05/2014: K 5.4 Creatinine 0.96 potassium cut back to 20 meq once a day.            07/12/2014: K 4.4 Creatinine 0.95             11/09/2014: K 3.6 Creatinine 1.04   SH: Lives with his daughter, son in law, and 2 granddaughters.     ROS: All systems negative except as listed in HPI, PMH and  Problem List.  Past Medical History  Diagnosis Date  . CHF (congestive heart failure)   . Diabetes mellitus without complication   . Hypertension   . Impaired vision in both eyes     Current Outpatient Prescriptions  Medication Sig Dispense Refill  . aspirin EC 81 MG tablet Take 81 mg by mouth daily.    . Blood Glucose Monitoring Suppl (ACCU-CHEK NANO SMARTVIEW) W/DEVICE KIT by Does not apply route.    . carvedilol (COREG) 3.125 MG tablet TAKE 1 TABLET BY MOUTH TWO TIMES DAILY WITH A MEAL 60 tablet 1  . glucose blood (ACCU-CHEK SMARTVIEW) test strip Use to check blood sugar once daily 100 each 12  . insulin glargine (LANTUS) 100 UNIT/ML injection Inject 5 Units into the skin at bedtime.     . milrinone (PRIMACOR) 20 MG/100ML SOLN infusion Inject 7.75 mcg/min into the vein continuous. 100 mL   . mirtazapine (REMERON) 7.5 MG tablet take 1 tablet by mouth at bedtime 90 tablet 0  . nitrofurantoin, macrocrystal-monohydrate, (MACROBID) 100 MG capsule Take 1 capsule (100 mg total) by mouth 2 (two) times daily. 14 capsule 0  .  ondansetron (ZOFRAN) 4 MG tablet Take 1 tablet (4 mg total) by mouth every 6 (six) hours as needed for nausea. 20 tablet 0  . pantoprazole (PROTONIX) 20 MG tablet take 1 tablet by mouth once daily 30 tablet 5  . potassium chloride SA (K-DUR,KLOR-CON) 20 MEQ tablet Take 1 tablet (20 mEq total) by mouth 2 (two) times daily. 60 tablet 3  . sertraline (ZOLOFT) 50 MG tablet Take 50 mg by mouth daily.    . simvastatin (ZOCOR) 5 MG tablet Take 5 mg by mouth at bedtime.    . torsemide (DEMADEX) 20 MG tablet Take 2 tablets (40 mg total) by mouth daily. Take 20 mg in PM as needed 90 tablet 2   No current facility-administered medications for this encounter.    Filed Vitals:   11/10/14 1446  BP: 94/66  Pulse: 111  Weight: 153 lb (69.4 kg)  SpO2: 97%    PHYSICAL EXAM: General: Chronically ill appearing. No resp difficulty, daughter present.  HEENT: normal; arcus senilus.   Neck: supple. JVP~5-6 . Carotids 2+ bilaterally; no bruits. No lymphadenopathy or thryomegaly appreciated. Cor: PMI laterally displaced.Tachy regular. No rubs. + S3; +0/3 systolic murmur  Lungs: clear Abdomen: soft, nontender, non distended. No hepatosplenomegaly. No bruits or masses. Good bowel sounds. Extremities: no cyanosis, clubbing, rash, edema; RUE picc Neuro: alert & orientedx3, cranial nerves grossly intact. Moves all 4 extremities w/o difficulty. Affect pleasant.    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 EF 10% mod-severe MR RV mod reduced. TAPSE 1.6 (10/2013) - Overall stable and doing well on milrinone. - NYHA II-III symptoms on milrinone 0.25 mcg.   Volume status low. Cut back torsemide to 40 mg daily  with extra 20 mg for weight gain.     Continue potassium to 20 meq BID.  - Continue coreg 3.125 mg BID. -No ace with soft bp. Stop lisinopril - Will recheck BMET per  [facility] weekly.  - Reinforced the need and importance of daily weights, a low sodium diet, and fluid restriction (less than 2 L a day). Instructed to call the HF clinic if weight increases more than 3 lbs overnight or 5 lbs in a week. 2)  Diabetes with diabetic retinopathy-  Had surgery on R eye and vision is much improved. Not able to do L eye surgery currently because heart too weak. Discussed the need to continue follow blood sugars closely and to follow up with PCP.  3) Sinus Tachycardia- due to HF Not candidate for ivabradine due to shock/milrinone.  4) HTN- Stable will continue current medications.  5) DM- Per PCP. Improving better controlled   Follow up in 3 months  Odette Watanabe NP-C   2:41 PM

## 2014-11-10 NOTE — Patient Instructions (Signed)
Follow-up in 6 weeks

## 2014-11-12 ENCOUNTER — Encounter: Payer: Self-pay | Admitting: Internal Medicine

## 2014-11-22 ENCOUNTER — Encounter: Payer: Self-pay | Admitting: Internal Medicine

## 2014-11-26 ENCOUNTER — Encounter: Payer: Self-pay | Admitting: Internal Medicine

## 2014-12-01 ENCOUNTER — Encounter: Payer: Self-pay | Admitting: Internal Medicine

## 2014-12-21 ENCOUNTER — Other Ambulatory Visit (HOSPITAL_COMMUNITY): Payer: Self-pay | Admitting: *Deleted

## 2014-12-21 ENCOUNTER — Telehealth (HOSPITAL_COMMUNITY): Payer: Self-pay | Admitting: Cardiology

## 2014-12-21 MED ORDER — POTASSIUM CHLORIDE CRYS ER 20 MEQ PO TBCR: 20.0000 meq | EXTENDED_RELEASE_TABLET | Freq: Two times a day (BID) | ORAL | Status: DC

## 2014-12-21 NOTE — Telephone Encounter (Signed)
TRIAGE VOICEMAIL Message left by daughter concerned about increased SOB and cough Pt reports he does not feel bad  Attempted to return call [phone], no answer and unable to leave message as voicemail is full Will try to call again later

## 2014-12-23 NOTE — Telephone Encounter (Addendum)
Spoke w/pt's daughter, she states since pt's Torsemide was decreased from 60 mg daily to 40 mg daily at OV 5/25 pt has developed increased SOB and a dry cough which has all continued to get worse, she state wt is stable pt doesn't seem to have edema.  She would like to increase Torsemide back to 60 mg daily, advised ok to increase dose today, will have Dr Shirlee Latch review and make sure that is ok,  Most recent bun 26 cr 1.37 on 12/21/14

## 2014-12-23 NOTE — Telephone Encounter (Signed)
Daughter aware and will let us know if symptoms do not get better

## 2014-12-23 NOTE — Telephone Encounter (Signed)
OK to go back to 60 torsemide daily.

## 2014-12-28 ENCOUNTER — Telehealth (HOSPITAL_COMMUNITY): Payer: Self-pay | Admitting: *Deleted

## 2014-12-28 NOTE — Telephone Encounter (Signed)
Can take torsemide 80 tomorrow, then back to 60 after that.   Can arrange office visit.

## 2014-12-28 NOTE — Telephone Encounter (Signed)
Spoke w/pt's daughter she states that she has been giving pt Torsemide 60 mg in AM and 40 mg in PM and it is not helping at all, sch appt for tomorrow at 1:40

## 2014-12-28 NOTE — Telephone Encounter (Signed)
HHRN called to say he is still SOB with exertion, increased fatigue, and still has dry cough. Despite increasing Torsemide to 60mg  daily last week. She reports that lungs are clear, slight edema in feet and ankles.  Will send to Dr. Shirlee Latch for review.

## 2014-12-29 ENCOUNTER — Ambulatory Visit (HOSPITAL_COMMUNITY)
Admission: RE | Admit: 2014-12-29 | Discharge: 2014-12-29 | Disposition: A | Discharge status: Home / Self Care | Payer: Medicare Other | Source: Ambulatory Visit | Attending: Cardiology | Admitting: Cardiology

## 2014-12-29 ENCOUNTER — Other Ambulatory Visit (HOSPITAL_COMMUNITY): Payer: Self-pay | Admitting: *Deleted

## 2014-12-29 VITALS — BP 86/54 | HR 104 | Wt 152.4 lb

## 2014-12-29 DIAGNOSIS — R000 Tachycardia, unspecified: Secondary | ICD-10-CM | POA: Insufficient documentation

## 2014-12-29 DIAGNOSIS — E785 Hyperlipidemia, unspecified: Secondary | ICD-10-CM | POA: Diagnosis not present

## 2014-12-29 DIAGNOSIS — I429 Cardiomyopathy, unspecified: Secondary | ICD-10-CM | POA: Insufficient documentation

## 2014-12-29 DIAGNOSIS — H548 Legal blindness, as defined in USA: Secondary | ICD-10-CM | POA: Insufficient documentation

## 2014-12-29 DIAGNOSIS — I5022 Chronic systolic (congestive) heart failure: Secondary | ICD-10-CM | POA: Diagnosis not present

## 2014-12-29 DIAGNOSIS — Z95810 Presence of automatic (implantable) cardiac defibrillator: Secondary | ICD-10-CM | POA: Diagnosis not present

## 2014-12-29 DIAGNOSIS — I10 Essential (primary) hypertension: Secondary | ICD-10-CM | POA: Diagnosis not present

## 2014-12-29 DIAGNOSIS — Z794 Long term (current) use of insulin: Secondary | ICD-10-CM | POA: Insufficient documentation

## 2014-12-29 DIAGNOSIS — Z79899 Other long term (current) drug therapy: Secondary | ICD-10-CM | POA: Diagnosis not present

## 2014-12-29 DIAGNOSIS — Z7982 Long term (current) use of aspirin: Secondary | ICD-10-CM | POA: Diagnosis not present

## 2014-12-29 DIAGNOSIS — E11319 Type 2 diabetes mellitus with unspecified diabetic retinopathy without macular edema: Secondary | ICD-10-CM | POA: Insufficient documentation

## 2014-12-29 LAB — BASIC METABOLIC PANEL
ANION GAP: 9 (ref 5–15)
BUN: 24 mg/dL — ABNORMAL HIGH (ref 6–20)
CALCIUM: 8.8 mg/dL — AB (ref 8.9–10.3)
CO2: 21 mmol/L — AB (ref 22–32)
Chloride: 105 mmol/L (ref 101–111)
Creatinine, Ser: 1.19 mg/dL (ref 0.61–1.24)
GFR calc Af Amer: >60 mL/min (ref >60)
GFR calc non Af Amer: >60 mL/min (ref >60)
GLUCOSE: 268 mg/dL — AB (ref 65–99)
POTASSIUM: 3.9 mmol/L (ref 3.5–5.1)
SODIUM: 135 mmol/L (ref 135–145)

## 2014-12-29 LAB — BRAIN NATRIURETIC PEPTIDE: B Natriuretic Peptide: 1290.9 pg/mL — ABNORMAL HIGH (ref 0.0–100.0)

## 2014-12-29 MED ORDER — FUROSEMIDE 10 MG/ML IJ SOLN: 80.0000 mg | Freq: Once | INTRAMUSCULAR | Status: DC

## 2014-12-29 MED ORDER — FUROSEMIDE 10 MG/ML IJ SOLN
80.0000 mg | Freq: Once | INTRAMUSCULAR | Status: AC
Start: 2014-12-29 — End: 2014-12-29
  Administered 2014-12-29: 80 mg via INTRAVENOUS
  Filled 2014-12-29: qty 8

## 2014-12-29 MED ORDER — TORSEMIDE 20 MG PO TABS: 80.0000 mg | ORAL_TABLET | Freq: Every day | ORAL | Status: DC

## 2014-12-29 NOTE — Progress Notes (Signed)
Patient ID: Gary Gallegos, male   DOB: [birth year], 69 y.o.   MRN: [id number]  PCP: Dr. Dutch Quint, FNP Primary Cardiologist: Dr. Ena Dawley  HPI: Gary Gallegos is a 69 yo male referred to the HF clinic by Dr.Nelson. He has a history of DM2 with severe diabetic retinopathy legally blind September , cataracts HTN, HLD, chronic systolic HF due to severe, EF 10% (11/14) s/p ICD Medtronic.   Started having problems with his heart in early 2013. Was followed by cardiologist at [facility]. Had cath in 2013 and arteries were "fine".  Admitted to [facility] in January with N/V/D. EGD  Showed severe gastritis and esophagitis. Started on Milrinone 0.25 mcg via PICC. Discharge weight 133 pounds. Followed by [facility].   He presents today as an add on for increased SOB and cough x 3-4 weeks. He has called several times to have his Torsemide adjusted for perceived fluid gain. Last visit we decreased Torsemide to 36m daily with an extra 217mprn for weight gain, and he is now up to 60 mg am and 20 mg pm at night. Daughter says he has been doing worse since the lisinopril was stopped several visits ago. Weight 153 at last visit, 152.4 today. Weight has not been going up at home. Daughter says he's doing very little at home.  Cough very dry with no production. He remains of milrinone 0.25 mcg via picc.  Still climbing steps up to his apartment on the third floor but needing more breaks. Weight at home still 148-150s. Able to do ADLs with minimal assistance.  Has a good appetite. Daughter says he has not been watching his salt as much and drinking more than 2L a day, a lot of water. Says he is peeing well, but drinks much more than he is putting out. Followed [facility].  Has weekly lab work every Monday. Denies fever, chills, CP.  Says he does feel more weak, and more tired since previous visit. Has occasional orthopnea, but denies PND.   Optivol reviewed: fluid index > threshold since 5/16 with impedance falling.   ECHO  05/05/13: EF 10%, diff HK, grade II DD, mild MR, LA mod dilated, RV mildly dilated and sys fx mod reduced, mod TR ECHO 11/04/13  EF 10% mod-severe MR RV mod reduced. TAPSE 1.6   Labs 07/15/13 K 5.7 Creatinine 1.95 instructed to cut lisinopril 5 mg daily.  Labs 07/22/13 K 3.6 Creatinine 0.81 Dig 1.9 digoxin stopped Labs  08/17/13 K 3.7 Creatinine 0.8 Glucose 190           08/20/13: K+ 3.7, Creatinine 0.70, Glucose 123, Na 137           10/22/13: K 3.9, creatinine 0.69, glucose 179           02/11/14: K 4.3 cr 1.2           07/05/2014: K 5.4 Creatinine 0.96 potassium cut back to 20 meq once a day.            07/12/2014: K 4.4 Creatinine 0.95             11/09/2014: K 3.6 Creatinine 1.04             6/16: K 3.9, creatinine 1.01, hgb 11.9  ECG: Sinus tachycardia at 102, poor RWP, right axis deviation  SH: Lives with his daughter, son in law, and 2 granddaughters.     ROS: All systems negative except as listed in HPI, PMH and Problem List.  Past Medical  History  Diagnosis Date  . CHF (congestive heart failure)   . Diabetes mellitus without complication   . Hypertension   . Impaired vision in both eyes     Current Outpatient Prescriptions  Medication Sig Dispense Refill  . aspirin EC 81 MG tablet Take 81 mg by mouth daily.    . Blood Glucose Monitoring Suppl (ACCU-CHEK NANO SMARTVIEW) W/DEVICE KIT by Does not apply route.    . carvedilol (COREG) 3.125 MG tablet TAKE 1 TABLET BY MOUTH TWO TIMES DAILY WITH A MEAL 60 tablet 1  . glucose blood (ACCU-CHEK SMARTVIEW) test strip Use to check blood sugar once daily 100 each 12  . insulin glargine (LANTUS) 100 UNIT/ML injection Inject 5 Units into the skin at bedtime.     . milrinone (PRIMACOR) 20 MG/100ML SOLN infusion Inject 7.75 mcg/min into the vein continuous. 100 mL   . mirtazapine (REMERON) 7.5 MG tablet take 1 tablet by mouth at bedtime 90 tablet 0  . nitrofurantoin, macrocrystal-monohydrate, (MACROBID) 100 MG capsule Take 1 capsule (100 mg total)  by mouth 2 (two) times daily. 14 capsule 0  . ondansetron (ZOFRAN) 4 MG tablet Take 1 tablet (4 mg total) by mouth every 6 (six) hours as needed for nausea. 20 tablet 0  . pantoprazole (PROTONIX) 20 MG tablet take 1 tablet by mouth once daily 30 tablet 5  . potassium chloride SA (K-DUR,KLOR-CON) 20 MEQ tablet Take 1 tablet (20 mEq total) by mouth 2 (two) times daily. 60 tablet 3  . sertraline (ZOLOFT) 50 MG tablet Take 50 mg by mouth daily.    . simvastatin (ZOCOR) 5 MG tablet Take 5 mg by mouth at bedtime.    . torsemide (DEMADEX) 20 MG tablet Take 2 tablets (40 mg total) by mouth daily. Take 20 mg in PM as needed 90 tablet 2   No current facility-administered medications for this encounter.    There were no vitals filed for this visit.  PHYSICAL EXAM: General: Chronically ill appearing. No resp difficulty, daughter present and grandchildren present HEENT: normal; arcus senilus.  Neck: supple. JVP 14-16. Carotids 2+ bilaterally; no bruits. No lymphadenopathy or thryomegaly appreciated. Cor: PMI laterally displaced.Tachy regular. No rubs. + S3; +8/6 systolic murmur   Lungs: clear Abdomen:  nontender, +distended. No hepatosplenomegaly. No bruits or masses. Good bowel sounds. Extremities: no cyanosis, clubbing, rash; RUE picc; 2+ edema to knees. Neuro: alert & orientedx3, cranial nerves grossly intact. Moves all 4 extremities w/o difficulty. Affect pleasant.  ASSESSMENT & PLAN:  1) Chronic biventricular HF: inotrope dependent.  NICM s/p ICD. EF 10% mod-severe MR RV mod reduced. He is on chronic home milrinone. NYHA class III symptoms with marked volume overload, though weight has not changed much.  - Continue current milrinone. - Increase torsemide 80 mg am, 40 mg pm.  Today, we will give him Lasix 80 mg IV x 1 in the clinic.  - Increase KCl to 40 bid.   - Continue coreg 3.125 mg BID. - No ACEI with soft bp.  - Will recheck BMET per  [facility] weekly.  - Reinforced the need and importance of  daily weights, a low sodium diet, and fluid restriction (less than 2 L a day). Instructed to call the HF clinic if weight increases more than 3 lbs overnight or 5 lbs in a week. - No room to increase meds with soft BP. 2)  Diabetes with diabetic retinopathy-  Had surgery on R eye and vision is much improved. Not able to do  L eye surgery currently because heart too weak. Discussed the need to continue follow blood sugars closely and to follow up with PCP.  3) Sinus Tachycardia- due to HF Not candidate for ivabradine due to shock/milrinone.  4) HTN- Stable will continue current medications.  5) DM- Per PCP. Improving better controlled   Follow up in 2 wks with BMET  Shirley Friar PA-C   1:56 PM   Patient seen with PA, agree with the above note.  He is markedly volume overloaded with NYHA class IIIb symptoms.  - Lasix 80 mg IV x 1 in the office today.  - Increase torsemide to 80 qam/40 qpm and KCl to 40 bid.   - Followup in 2 wks.   Loralie Champagne 12/30/2014

## 2014-12-29 NOTE — Patient Instructions (Signed)
Increase Torsemide to 80 mg (4 tabs) in AM and 40 mg (2 tabs) in PM for 5 DAYS ONLY,  THEN decrease to 80 mg (4 tabs) DAILY  Increase Potassium to 40 meq (2 tabs) Twice daily   Labs today  Your physician recommends that you schedule a follow-up appointment in: 1 week

## 2014-12-29 NOTE — Progress Notes (Signed)
80 mg IV Lasix given via subclavian tunneled picc, pt tolerated well

## 2015-01-05 ENCOUNTER — Telehealth (HOSPITAL_COMMUNITY): Payer: Self-pay | Admitting: Cardiology

## 2015-01-05 NOTE — Telephone Encounter (Signed)
Yasmin, RN [facility] Aware of med changes, states she will also try to contact patient for instructions and call office and or given pt dose changes

## 2015-01-05 NOTE — Telephone Encounter (Signed)
ABNORMAL LABS Labs drawn 01/04/2015 k 2.4  per VO Amy Clegg, NP/ Otilio Saber, [place] Pt needs potassium today (40 wait an hour, 40 wait an hour, 40) Then increase normal dose to 40 BID Repeat labs on Monday 01/10/15- order sent to [facility]

## 2015-01-06 MED ORDER — POTASSIUM CHLORIDE CRYS ER 20 MEQ PO TBCR: 20.0000 meq | EXTENDED_RELEASE_TABLET | Freq: Two times a day (BID) | ORAL | Status: DC

## 2015-01-06 NOTE — Telephone Encounter (Signed)
Spoke w/pt's daughter, she states pt has been out of KCL for about 2 weeks and the pharmacy told them they have not gotten a prescription from [place], verified pharmacy and advised rx was sent in on 7/5 per chart, will send in rx again now.  Advised daughter to get med ASAP and have pt take 40 meq x 3 doses today then resume dose of 20 meq bid, will recheck bmet on Mon 7/25 at appt, daughter is aware and agreeable

## 2015-01-09 ENCOUNTER — Other Ambulatory Visit: Payer: Self-pay | Admitting: Family

## 2015-01-10 ENCOUNTER — Encounter (HOSPITAL_COMMUNITY): Payer: Self-pay

## 2015-01-10 ENCOUNTER — Encounter: Payer: Self-pay | Admitting: Internal Medicine

## 2015-01-10 ENCOUNTER — Ambulatory Visit (HOSPITAL_COMMUNITY)
Admission: RE | Admit: 2015-01-10 | Discharge: 2015-01-10 | Disposition: A | Discharge status: Home / Self Care | Payer: Medicare Other | Source: Ambulatory Visit | Attending: Cardiology | Admitting: Cardiology

## 2015-01-10 ENCOUNTER — Telehealth (HOSPITAL_COMMUNITY): Payer: Self-pay | Admitting: Cardiology

## 2015-01-10 ENCOUNTER — Encounter: Payer: Self-pay | Admitting: Cardiology

## 2015-01-10 VITALS — BP 84/61 | HR 118 | Resp 18 | Wt 155.0 lb

## 2015-01-10 DIAGNOSIS — I5022 Chronic systolic (congestive) heart failure: Secondary | ICD-10-CM

## 2015-01-10 LAB — CARBOXYHEMOGLOBIN
Carboxyhemoglobin: 1.2 % (ref 0.5–1.5)
Methemoglobin: 0.9 % (ref 0.0–1.5)
O2 SAT: 53 %
TOTAL HEMOGLOBIN: 11.3 g/dL — AB (ref 13.5–18.0)

## 2015-01-10 MED ORDER — DOXYCYCLINE HYCLATE 50 MG PO CAPS: 100.0000 mg | ORAL_CAPSULE | Freq: Every day | ORAL | Status: DC

## 2015-01-10 MED ORDER — FUROSEMIDE 10 MG/ML IJ SOLN
80.0000 mg | Freq: Once | INTRAMUSCULAR | Status: AC
  Administered 2015-01-10: 80 mg via INTRAVENOUS

## 2015-01-10 MED ORDER — POTASSIUM CHLORIDE CRYS ER 20 MEQ PO TBCR
40.0000 meq | EXTENDED_RELEASE_TABLET | Freq: Once | ORAL | Status: AC
  Administered 2015-01-10: 40 meq via ORAL

## 2015-01-10 MED ORDER — TORSEMIDE 20 MG PO TABS: ORAL_TABLET | ORAL | Status: DC

## 2015-01-10 NOTE — Progress Notes (Signed)
Patient seen in CHF clinic for routine f/u.  Weight back up 6 lbs since IV lasix 80mg  administered last week.  Per Dr. [name] 80 mg IV lasix and 40 meq PO potassium given in clinic.  Patient has had a dry hacky cough past few days with fluid overload.  Patient had long coughing spell causing emesis in clinic, approx. 200 cc, shortly after patient was given 40 meq PO potassium.  Patient remains in clinic being monitored after IV lasix.  Urinal and call bell in reach.  Total Urinary Output:  Ave Filter

## 2015-01-10 NOTE — Telephone Encounter (Signed)
TRIAGE Joice Lofts with [facility] called to leave message shortly after pts HH visit Called to report Weight increase x 6 lbs over 6 days No LE edema NO increased SOB-lungs clear 110/74 HR 106 99% RA Also BNP and BMET are pending  Returned call to inform message received, looking out for labs, and updated regarding appointment

## 2015-01-10 NOTE — Progress Notes (Signed)
Patient ID: Gary Gallegos, male   DOB: 1945-09-10, 69 y.o.   MRN: [id number]  PCP: Dr. Dutch Quint, FNP Primary Cardiologist: Dr. Ena Gallegos  HPI: Gary Gallegos is a 69 yo male referred to the HF clinic by Dr.Nelson. He has a history of DM2 with severe diabetic retinopathy legally blind September , cataracts HTN, HLD, chronic systolic HF due to severe, EF 10% (11/14) s/p ICD Medtronic.   Started having problems with his heart in early 2013. Was followed by cardiologist at [facility] Had cath in 2013 and arteries were "fine".  Admitted to [facility] in January with N/V/D. EGD  Showed severe gastritis and esophagitis. Started on Milrinone 0.25 mcg via PICC. Discharge weight 133 pounds. Followed by [facility].   He presents today for HF follow up.  At last visit we gave him Lasix 80 mg IV x 1 in the office and increased torsemide to 80 qam/40 qpm and KCl to 40 bid.  Poor appetite. Increase dyspnea with exertion. Denies PND. + Orthopnea. Increase cough.    Optivol reviewed: fluid index > threshold since 5/16 with impedance falling.   ECHO 05/05/13: EF 10%, diff HK, grade II DD, mild MR, LA mod dilated, RV mildly dilated and sys fx mod reduced, mod TR ECHO 11/04/13  EF 10% mod-severe MR RV mod reduced. TAPSE 1.6   Labs 07/15/13 K 5.7 Creatinine 1.95 instructed to cut lisinopril 5 mg daily.  Labs 07/22/13 K 3.6 Creatinine 0.81 Dig 1.9 digoxin stopped Labs  08/17/13 K 3.7 Creatinine 0.8 Glucose 190           08/20/13: K+ 3.7, Creatinine 0.70, Glucose 123, Na 137           10/22/13: K 3.9, creatinine 0.69, glucose 179           02/11/14: K 4.3 cr 1.2           07/05/2014: K 5.4 Creatinine 0.96 potassium cut back to 20 meq once a day.            07/12/2014: K 4.4 Creatinine 0.95             11/09/2014: K 3.6 Creatinine 1.04             6/16: K 3.9, creatinine 1.01, hgb 11.9  SH: Lives with his daughter, son in law, and 2 granddaughters.     ROS: All Gallegos negative except as listed in HPI, PMH and Problem  List.  Past Medical History  Diagnosis Date  . CHF (congestive heart failure)   . Diabetes mellitus without complication   . Hypertension   . Impaired vision in both eyes     Current Outpatient Prescriptions  Medication Sig Dispense Refill  . aspirin EC 81 MG tablet Take 81 mg by mouth daily.    . Blood Glucose Monitoring Suppl (ACCU-CHEK NANO SMARTVIEW) W/DEVICE KIT by Does not apply route.    . carvedilol (COREG) 3.125 MG tablet TAKE 1 TABLET BY MOUTH TWO TIMES DAILY WITH A MEAL 60 tablet 1  . glucose blood (ACCU-CHEK SMARTVIEW) test strip Use to check blood sugar once daily 100 each 12  . insulin glargine (LANTUS) 100 UNIT/ML injection Inject 5 Units into the skin at bedtime.     . milrinone (PRIMACOR) 20 MG/100ML SOLN infusion Inject 7.75 mcg/min into the vein continuous. 100 mL   . mirtazapine (REMERON) 7.5 MG tablet take 1 tablet by mouth at bedtime 90 tablet 0  . nitrofurantoin, macrocrystal-monohydrate, (MACROBID) 100 MG capsule Take  1 capsule (100 mg total) by mouth 2 (two) times daily. 14 capsule 0  . ondansetron (ZOFRAN) 4 MG tablet Take 1 tablet (4 mg total) by mouth every 6 (six) hours as needed for nausea. 20 tablet 0  . pantoprazole (PROTONIX) 20 MG tablet take 1 tablet by mouth once daily 30 tablet 5  . potassium chloride SA (K-DUR,KLOR-CON) 20 MEQ tablet Take 1 tablet (20 mEq total) by mouth 2 (two) times daily. 62 tablet 3  . sertraline (ZOLOFT) 50 MG tablet Take 50 mg by mouth daily.    . simvastatin (ZOCOR) 5 MG tablet Take 5 mg by mouth at bedtime.    . torsemide (DEMADEX) 20 MG tablet Take 4 tablets (80 mg total) by mouth daily. Take 20 mg in PM as needed (Patient taking differently: Take 60 mg by mouth daily. Take 20 mg in PM as needed) 135 tablet 3   No current facility-administered medications for this encounter.    Filed Vitals:   01/10/15 1413  BP: 84/61  Pulse: 118  Resp: 18  Weight: 155 lb (70.308 kg)  SpO2: 96%    PHYSICAL EXAM: General:  Chronically ill appearing. SOB walking in the clinic. Daughter present and grandchildren present HEENT: normal; arcus senilus.  Neck: supple. JVP to ear . Carotids 2+ bilaterally; no bruits. No lymphadenopathy or thryomegaly appreciated. Cor: PMI laterally displaced.Tachy regular. No rubs. + S3; +5/0 systolic murmur   Lungs: clear Abdomen:  nontender, +distended. No hepatosplenomegaly. No bruits or masses. Good bowel sounds. Extremities: no cyanosis, clubbing, rash; RUE picc; 2+ edema to knees. R thumb  Erythema and pus noted.  Neuro: alert & orientedx3, cranial nerves grossly intact. Moves all 4 extremities w/o difficulty. Affect pleasant.  ASSESSMENT & PLAN:  1) Chronic biventricular HF: inotrope dependent.  NICM s/p ICD. EF 10% mod-severe MR RV mod reduced. He is on chronic home milrinone. NYHA class III -IV symptoms with marked volume overload.  Give 80 mg IV lasix + 40 meq potassium now. Increase home torsemide to 80 mg in am and 40 mg in pm.  - Continue current milrinone. - Continue coreg 3.125 mg BID. - No ACEI with soft bp.  - Will recheck BMET per  [facility] weekly.  - Reinforced the need and importance of daily weights, a low sodium diet, and fluid restriction (less than 2 L a day). Instructed to call the HF clinic if weight increases more than 3 lbs overnight or 5 lbs in a week. 2)  Diabetes with diabetic retinopathy-  Had surgery on R eye and vision is much improved. Not able to do L eye surgery currently because heart too weak. Discussed the need to continue follow blood sugars closely and to follow up with PCP.  3) Sinus Tachycardia- due to HF Not candidate for ivabradine due to shock/milrinone.  4) HTN- Stable will continue current medications.  5) DM- Per PCP. Improving better controlled  6) R thumb Abscess- Excisional sharp debridement performed with scissor and forcep. Moderate purulent exudate. Wound Culture obtained. Start doxycycline 100 mg twice a day for 7 days.   Follow up  next week to reassess volume status.   CLEGG,[name]  2:48 PM   Patient seen and examined with Gary Grinder, NP. We discussed all aspects of the encounter. I agree with the assessment and plan as stated above.   He remains on milrinone and now with marked volume overload. Agree with increasing diuretics as above. Will see back next week. If no improvement may  need admission for IV diuresis and RHC. Debridement of R thumb abscess performed in clinic.   Total time spent 45 minutes. Over half that time spent discussing above.   Bensimhon, Daniel,MD 11:24 PM

## 2015-01-10 NOTE — Patient Instructions (Addendum)
INCREASE Torsemide to 80 mg (4 tabs) in am and 40 mg (2 tabs) in pm.  Take Doxycycline antibiotic 2 capsules daily for 7 days.  Call clinic in am to give update.  If you are not doing any better we will discuss admitting you directly from home. Otherwise, follow up Friday.  Do the following things EVERYDAY: 1) Weigh yourself in the morning before breakfast. Write it down and keep it in a log. 2) Take your medicines as prescribed 3) Eat low salt foods-Limit salt (sodium) to 2000 mg per day.  4) Stay as active as you can everyday 5) Limit all fluids for the day to less than 2 liters

## 2015-01-13 LAB — WOUND CULTURE

## 2015-01-17 ENCOUNTER — Other Ambulatory Visit (HOSPITAL_COMMUNITY): Payer: Self-pay | Admitting: *Deleted

## 2015-01-17 MED ORDER — CARVEDILOL 3.125 MG PO TABS: ORAL_TABLET | ORAL | Status: DC

## 2015-01-18 ENCOUNTER — Encounter (HOSPITAL_COMMUNITY): Payer: Self-pay | Admitting: General Practice

## 2015-01-18 ENCOUNTER — Ambulatory Visit (HOSPITAL_BASED_OUTPATIENT_CLINIC_OR_DEPARTMENT_OTHER)
Admission: RE | Admit: 2015-01-18 | Discharge: 2015-01-18 | Disposition: A | Discharge status: Home / Self Care | Payer: Medicare Other | Source: Ambulatory Visit | Attending: Internal Medicine | Admitting: Internal Medicine

## 2015-01-18 ENCOUNTER — Encounter (HOSPITAL_COMMUNITY): Payer: Self-pay

## 2015-01-18 ENCOUNTER — Inpatient Hospital Stay (HOSPITAL_COMMUNITY)
Admission: AD | Admit: 2015-01-18 | Discharge: 2015-01-22 | DRG: 291 | Disposition: A | Discharge status: Home / Self Care | Payer: Medicare Other | Source: Ambulatory Visit | Attending: Internal Medicine | Admitting: Internal Medicine

## 2015-01-18 VITALS — BP 106/68 | HR 100 | Wt 159.4 lb

## 2015-01-18 DIAGNOSIS — R570 Cardiogenic shock: Secondary | ICD-10-CM | POA: Diagnosis present

## 2015-01-18 DIAGNOSIS — IMO0002 Reserved for concepts with insufficient information to code with codable children: Secondary | ICD-10-CM

## 2015-01-18 DIAGNOSIS — E785 Hyperlipidemia, unspecified: Secondary | ICD-10-CM | POA: Diagnosis present

## 2015-01-18 DIAGNOSIS — I5023 Acute on chronic systolic (congestive) heart failure: Secondary | ICD-10-CM

## 2015-01-18 DIAGNOSIS — I509 Heart failure, unspecified: Secondary | ICD-10-CM | POA: Diagnosis not present

## 2015-01-18 DIAGNOSIS — H548 Legal blindness, as defined in USA: Secondary | ICD-10-CM | POA: Diagnosis present

## 2015-01-18 DIAGNOSIS — Z794 Long term (current) use of insulin: Secondary | ICD-10-CM | POA: Diagnosis not present

## 2015-01-18 DIAGNOSIS — F329 Major depressive disorder, single episode, unspecified: Secondary | ICD-10-CM | POA: Diagnosis present

## 2015-01-18 DIAGNOSIS — R000 Tachycardia, unspecified: Secondary | ICD-10-CM | POA: Diagnosis present

## 2015-01-18 DIAGNOSIS — Z7982 Long term (current) use of aspirin: Secondary | ICD-10-CM

## 2015-01-18 DIAGNOSIS — R0600 Dyspnea, unspecified: Secondary | ICD-10-CM

## 2015-01-18 DIAGNOSIS — K219 Gastro-esophageal reflux disease without esophagitis: Secondary | ICD-10-CM | POA: Diagnosis present

## 2015-01-18 DIAGNOSIS — Z882 Allergy status to sulfonamides status: Secondary | ICD-10-CM

## 2015-01-18 DIAGNOSIS — E1165 Type 2 diabetes mellitus with hyperglycemia: Secondary | ICD-10-CM

## 2015-01-18 DIAGNOSIS — I159 Secondary hypertension, unspecified: Secondary | ICD-10-CM

## 2015-01-18 DIAGNOSIS — E876 Hypokalemia: Secondary | ICD-10-CM | POA: Diagnosis not present

## 2015-01-18 DIAGNOSIS — I471 Supraventricular tachycardia: Secondary | ICD-10-CM

## 2015-01-18 DIAGNOSIS — N183 Chronic kidney disease, stage 3 (moderate): Secondary | ICD-10-CM | POA: Diagnosis not present

## 2015-01-18 DIAGNOSIS — E11319 Type 2 diabetes mellitus with unspecified diabetic retinopathy without macular edema: Secondary | ICD-10-CM | POA: Diagnosis present

## 2015-01-18 DIAGNOSIS — Z880 Allergy status to penicillin: Secondary | ICD-10-CM

## 2015-01-18 DIAGNOSIS — E1139 Type 2 diabetes mellitus with other diabetic ophthalmic complication: Secondary | ICD-10-CM

## 2015-01-18 DIAGNOSIS — Z66 Do not resuscitate: Secondary | ICD-10-CM

## 2015-01-18 DIAGNOSIS — I10 Essential (primary) hypertension: Secondary | ICD-10-CM | POA: Diagnosis present

## 2015-01-18 DIAGNOSIS — F72 Severe intellectual disabilities: Secondary | ICD-10-CM | POA: Diagnosis present

## 2015-01-18 HISTORY — DX: Gastro-esophageal reflux disease without esophagitis: K21.9

## 2015-01-18 HISTORY — DX: Hyperlipidemia, unspecified: E78.5

## 2015-01-18 HISTORY — DX: Biventricular heart failure: I50.82

## 2015-01-18 HISTORY — DX: Type 2 diabetes mellitus without complications: E11.9

## 2015-01-18 HISTORY — DX: Other cardiomyopathies: I42.8

## 2015-01-18 HISTORY — DX: Presence of automatic (implantable) cardiac defibrillator: Z95.810

## 2015-01-18 HISTORY — DX: Blindness, one eye, unspecified eye: H54.40

## 2015-01-18 HISTORY — DX: Major depressive disorder, single episode, unspecified: F32.9

## 2015-01-18 HISTORY — DX: Depression, unspecified: F32.A

## 2015-01-18 LAB — COMPREHENSIVE METABOLIC PANEL
ALT: 11 U/L — ABNORMAL LOW (ref 17–63)
AST: 25 U/L (ref 15–41)
Albumin: 3.3 g/dL — ABNORMAL LOW (ref 3.5–5.0)
Alkaline Phosphatase: 130 U/L — ABNORMAL HIGH (ref 38–126)
Anion gap: 9 (ref 5–15)
BUN: 32 mg/dL — ABNORMAL HIGH (ref 6–20)
CO2: 18 mmol/L — AB (ref 22–32)
CREATININE: 1.29 mg/dL — AB (ref 0.61–1.24)
Calcium: 8.6 mg/dL — ABNORMAL LOW (ref 8.9–10.3)
Chloride: 108 mmol/L (ref 101–111)
GFR calc Af Amer: >60 mL/min (ref >60)
GFR calc non Af Amer: 56 mL/min — ABNORMAL LOW (ref >60)
Glucose, Bld: 320 mg/dL — ABNORMAL HIGH (ref 65–99)
POTASSIUM: 4.2 mmol/L (ref 3.5–5.1)
Sodium: 135 mmol/L (ref 135–145)
Total Bilirubin: 2.4 mg/dL — ABNORMAL HIGH (ref 0.3–1.2)
Total Protein: 6.4 g/dL — ABNORMAL LOW (ref 6.5–8.1)

## 2015-01-18 LAB — TROPONIN I

## 2015-01-18 LAB — BRAIN NATRIURETIC PEPTIDE: B NATRIURETIC PEPTIDE 5: 1896.9 pg/mL — AB (ref 0.0–100.0)

## 2015-01-18 LAB — CBC WITH DIFFERENTIAL/PLATELET
BASOS ABS: 0 10*3/uL (ref 0.0–0.1)
BASOS PCT: 0 % (ref 0–1)
Eosinophils Absolute: 0 10*3/uL (ref 0.0–0.7)
Eosinophils Relative: 0 % (ref 0–5)
HCT: 36.4 % — ABNORMAL LOW (ref 39.0–52.0)
Hemoglobin: 11.7 g/dL — ABNORMAL LOW (ref 13.0–17.0)
Lymphocytes Relative: 14 % (ref 12–46)
Lymphs Abs: 0.7 10*3/uL (ref 0.7–4.0)
MCH: 24.7 pg — AB (ref 26.0–34.0)
MCHC: 32.1 g/dL (ref 30.0–36.0)
MCV: 77 fL — AB (ref 78.0–100.0)
MONOS PCT: 12 % (ref 3–12)
Monocytes Absolute: 0.7 10*3/uL (ref 0.1–1.0)
NEUTROS PCT: 74 % (ref 43–77)
Neutro Abs: 4.1 10*3/uL (ref 1.7–7.7)
PLATELETS: 246 10*3/uL (ref 150–400)
RBC: 4.73 MIL/uL (ref 4.22–5.81)
RDW: 17.2 % — AB (ref 11.5–15.5)
WBC: 5.5 10*3/uL (ref 4.0–10.5)

## 2015-01-18 LAB — GLUCOSE, CAPILLARY: Glucose-Capillary: 269 mg/dL — ABNORMAL HIGH (ref 65–99)

## 2015-01-18 LAB — MRSA PCR SCREENING: MRSA by PCR: NEGATIVE

## 2015-01-18 LAB — CARBOXYHEMOGLOBIN
Carboxyhemoglobin: 1 % (ref 0.5–1.5)
Methemoglobin: 1.4 % (ref 0.0–1.5)
O2 Saturation: 31.5 %
TOTAL HEMOGLOBIN: 12.2 g/dL — AB (ref 13.5–18.0)

## 2015-01-18 LAB — TSH: TSH: 3.501 u[IU]/mL (ref 0.350–4.500)

## 2015-01-18 LAB — MAGNESIUM: Magnesium: 2 mg/dL (ref 1.7–2.4)

## 2015-01-18 IMAGING — CR DG CHEST 2V
2 series · 2 of 2 positions shown · non-contrast
Comparison: 07/23/2013.

CLINICAL DATA: PICC placement.

EXAM:
CHEST  2 VIEW

[w chest pa]
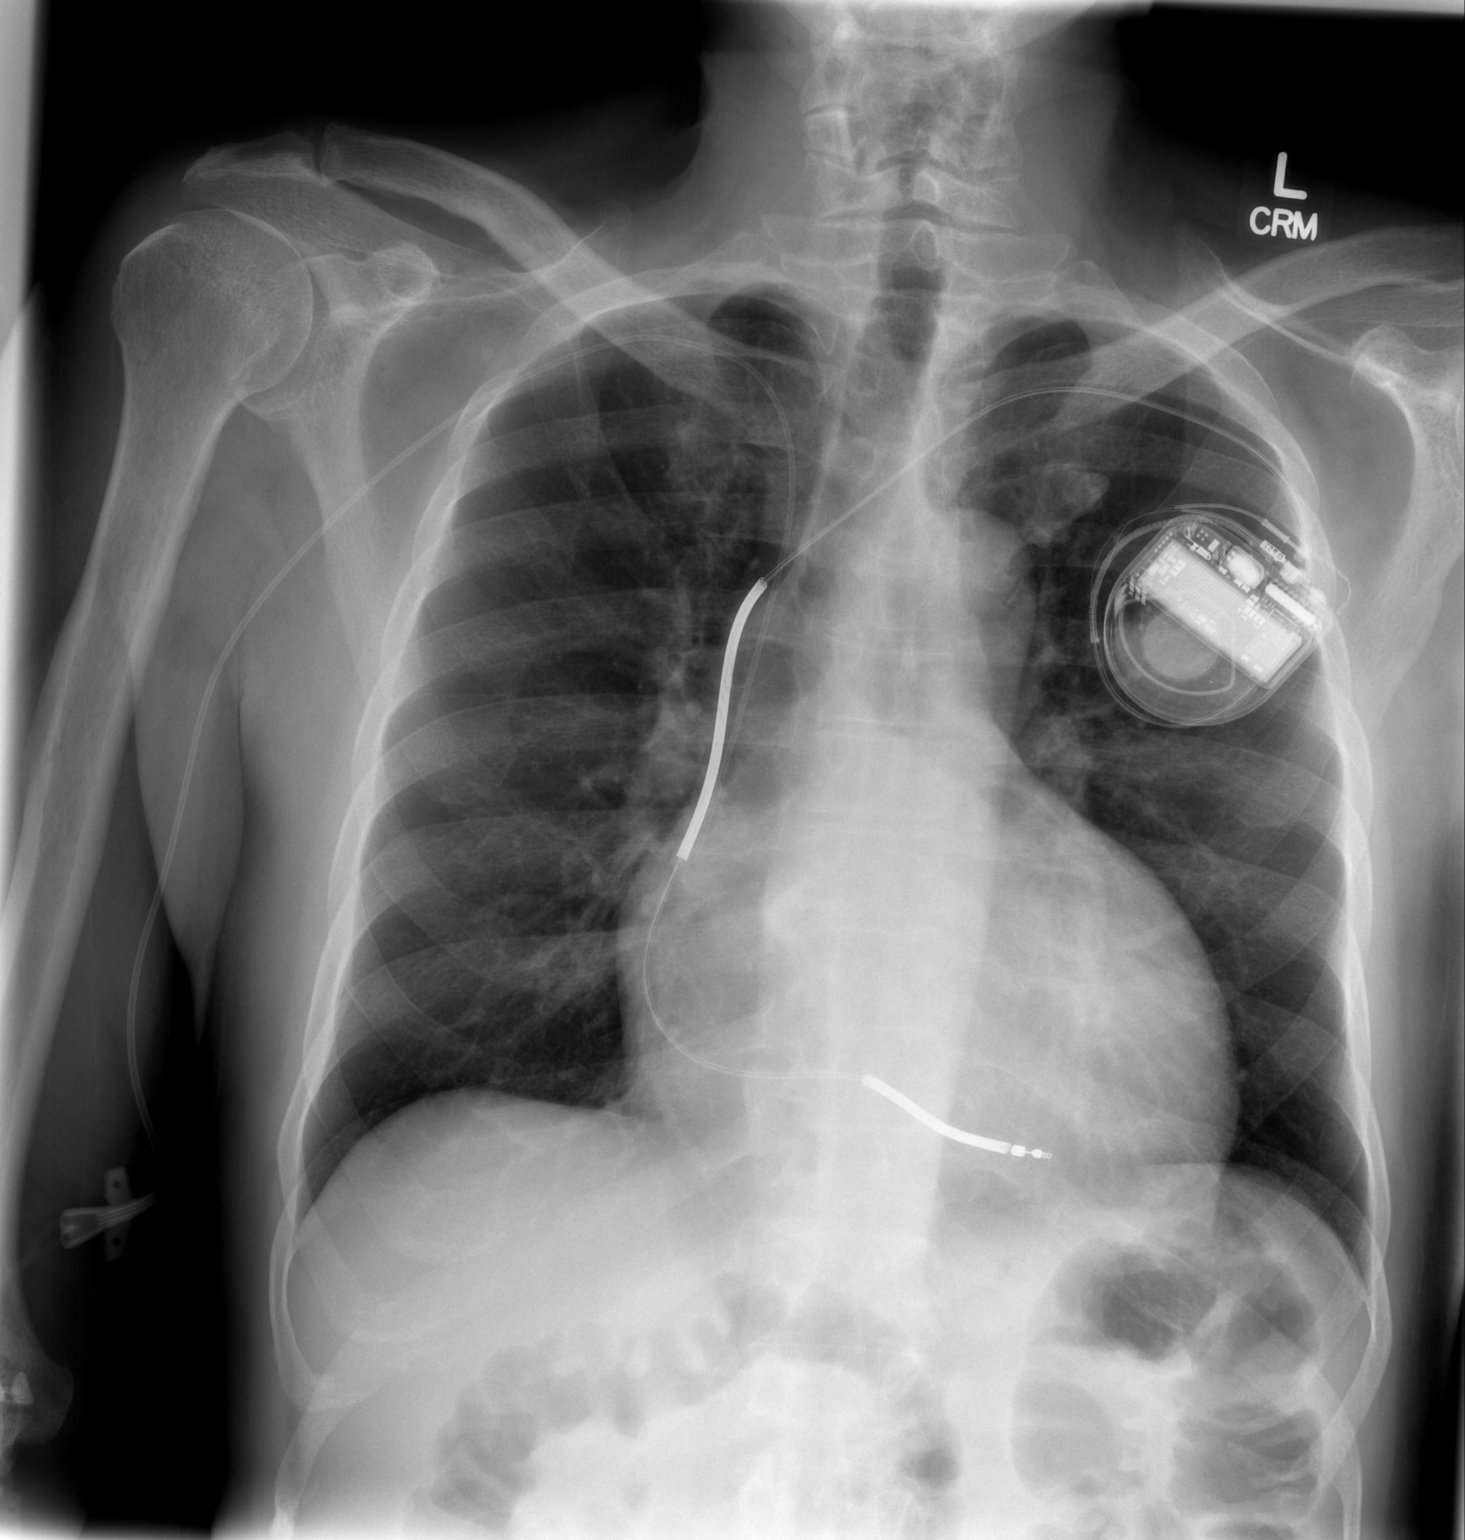

[w chest lat]
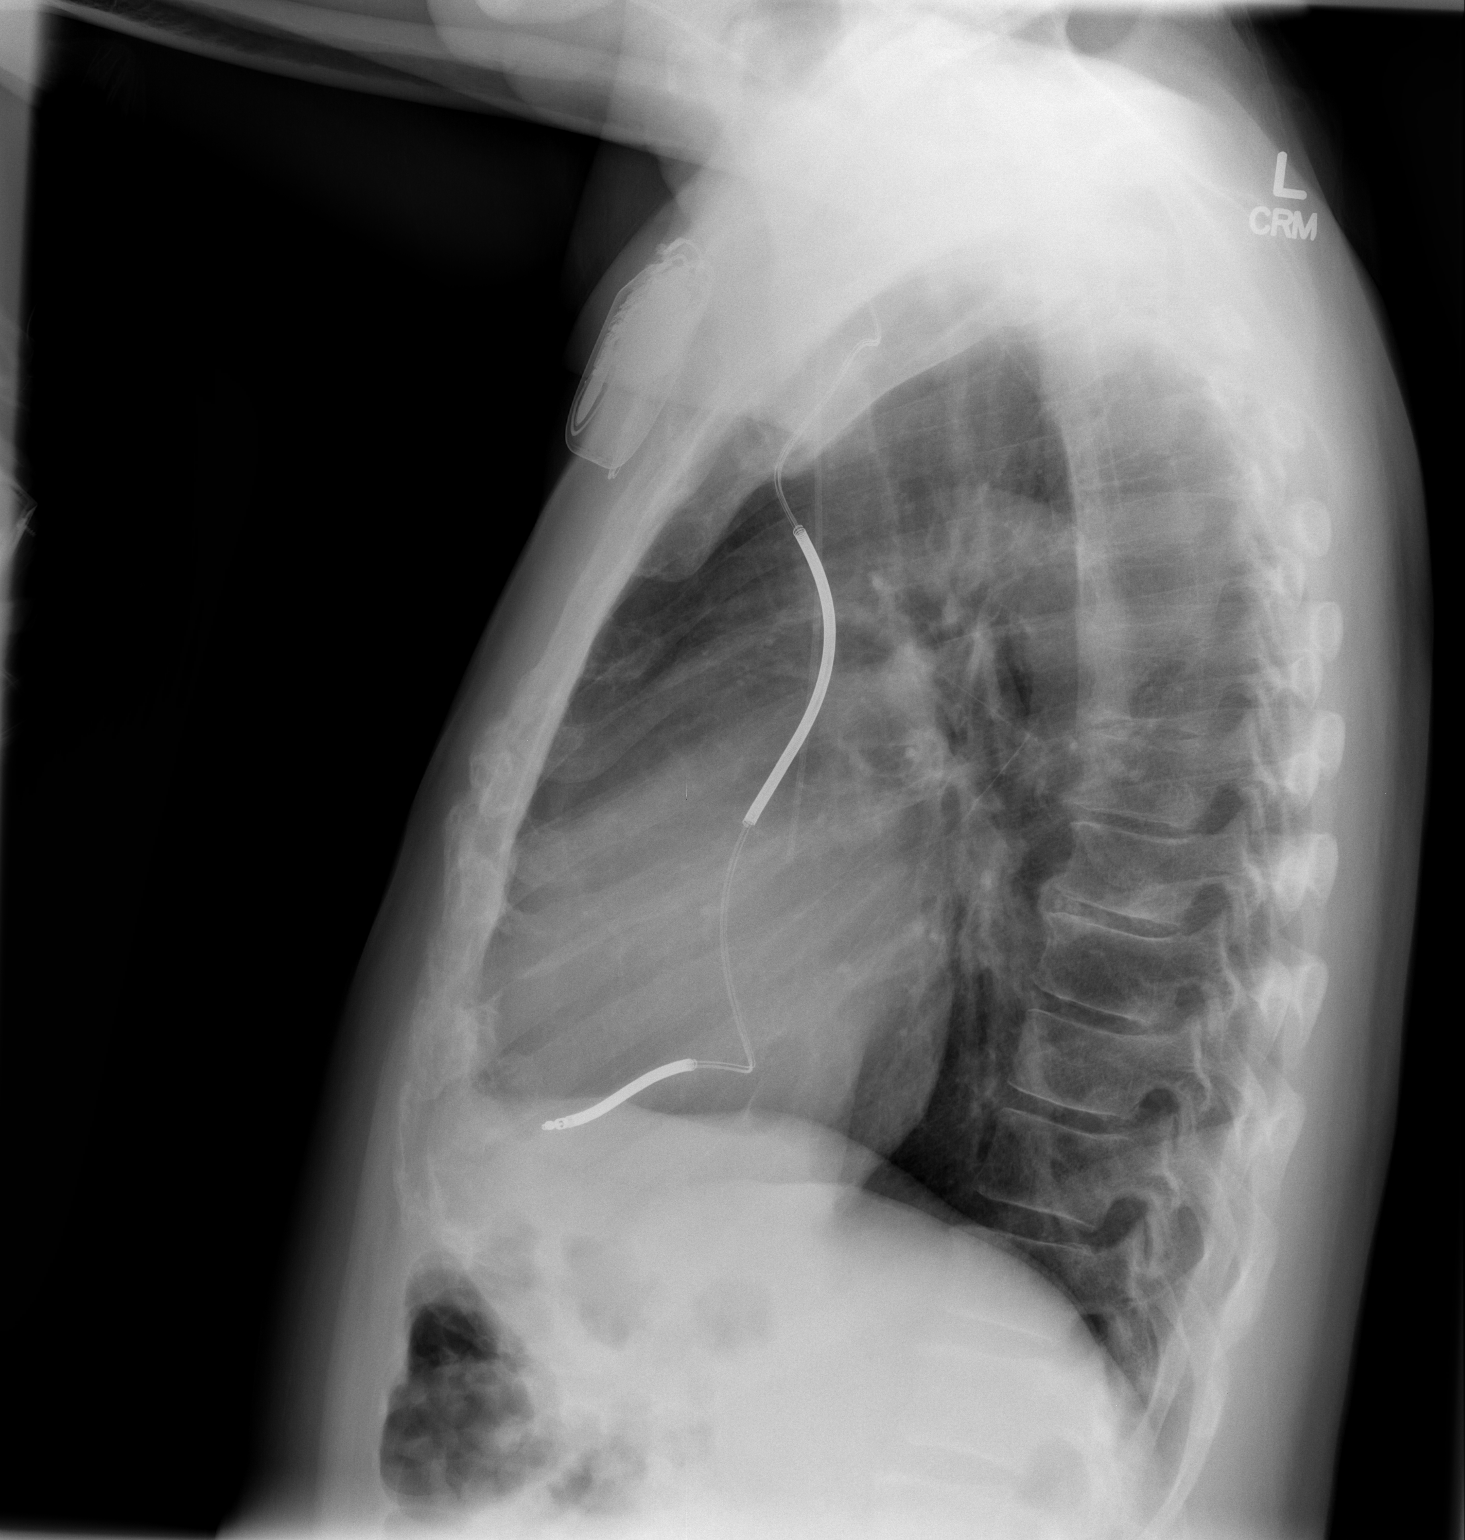

[2 of 2 positions shown; findings below may reference images not displayed]

FINDINGS: Right PICC tip at the junction of the superior vena cava and right
atrium. Stable left subclavian AICD lead. The cardiac silhouette
remains enlarged. The lungs are clear with normal vascularity.
Thoracic spine degenerative changes.
IMPRESSION: 1. Right PICC tip at the junction of the superior vena cava and
right atrium.
2. Stable cardiomegaly.

## 2015-01-18 MED ORDER — MIRTAZAPINE 7.5 MG PO TABS
7.5000 mg | ORAL_TABLET | Freq: Every day | ORAL | Status: DC
  Administered 2015-01-18 – 2015-01-21 (×4): 7.5 mg via ORAL
  Filled 2015-01-18 (×4): qty 1

## 2015-01-18 MED ORDER — ACETAMINOPHEN 325 MG PO TABS
650.0000 mg | ORAL_TABLET | ORAL | Status: DC | PRN
  Administered 2015-01-21: 650 mg via ORAL
  Filled 2015-01-18: qty 2

## 2015-01-18 MED ORDER — MILRINONE IN DEXTROSE 20 MG/100ML IV SOLN
0.2500 ug/kg/min | INTRAVENOUS | Status: DC
  Administered 2015-01-18: 0.125 ug/kg/min via INTRAVENOUS
  Administered 2015-01-19: 0.25 ug/kg/min via INTRAVENOUS
  Filled 2015-01-18 (×2): qty 100

## 2015-01-18 MED ORDER — ONDANSETRON HCL 4 MG PO TABS: 4.0000 mg | ORAL_TABLET | Freq: Four times a day (QID) | ORAL | Status: DC | PRN

## 2015-01-18 MED ORDER — INSULIN GLARGINE 100 UNIT/ML ~~LOC~~ SOLN
5.0000 [IU] | Freq: Every day | SUBCUTANEOUS | Status: DC
  Administered 2015-01-18 – 2015-01-21 (×4): 5 [IU] via SUBCUTANEOUS
  Filled 2015-01-18 (×5): qty 0.05

## 2015-01-18 MED ORDER — POTASSIUM CHLORIDE CRYS ER 20 MEQ PO TBCR
20.0000 meq | EXTENDED_RELEASE_TABLET | Freq: Two times a day (BID) | ORAL | Status: DC
  Administered 2015-01-18 – 2015-01-20 (×4): 20 meq via ORAL
  Filled 2015-01-18 (×4): qty 1

## 2015-01-18 MED ORDER — SODIUM CHLORIDE 0.9 % IV SOLN
250.0000 mL | INTRAVENOUS | Status: DC | PRN
  Administered 2015-01-19 – 2015-01-20 (×2): 250 mL via INTRAVENOUS

## 2015-01-18 MED ORDER — ENOXAPARIN SODIUM 40 MG/0.4ML ~~LOC~~ SOLN
40.0000 mg | SUBCUTANEOUS | Status: DC
  Administered 2015-01-18 – 2015-01-21 (×4): 40 mg via SUBCUTANEOUS
  Filled 2015-01-18 (×4): qty 0.4

## 2015-01-18 MED ORDER — FUROSEMIDE 10 MG/ML IJ SOLN
80.0000 mg | Freq: Two times a day (BID) | INTRAMUSCULAR | Status: DC
  Administered 2015-01-18 – 2015-01-19 (×2): 80 mg via INTRAVENOUS
  Filled 2015-01-18 (×2): qty 8

## 2015-01-18 MED ORDER — PANTOPRAZOLE SODIUM 20 MG PO TBEC
20.0000 mg | DELAYED_RELEASE_TABLET | Freq: Every day | ORAL | Status: DC
  Administered 2015-01-18 – 2015-01-22 (×5): 20 mg via ORAL
  Filled 2015-01-18 (×5): qty 1

## 2015-01-18 MED ORDER — SIMVASTATIN 10 MG PO TABS
5.0000 mg | ORAL_TABLET | Freq: Every day | ORAL | Status: DC
  Administered 2015-01-18 – 2015-01-21 (×4): 5 mg via ORAL
  Filled 2015-01-18 (×4): qty 1

## 2015-01-18 MED ORDER — SODIUM CHLORIDE 0.9 % IJ SOLN: 3.0000 mL | INTRAMUSCULAR | Status: DC | PRN

## 2015-01-18 MED ORDER — ASPIRIN EC 81 MG PO TBEC
81.0000 mg | DELAYED_RELEASE_TABLET | Freq: Every day | ORAL | Status: DC
  Administered 2015-01-19 – 2015-01-22 (×4): 81 mg via ORAL
  Filled 2015-01-18 (×4): qty 1

## 2015-01-18 MED ORDER — INSULIN ASPART 100 UNIT/ML ~~LOC~~ SOLN
0.0000 [IU] | Freq: Three times a day (TID) | SUBCUTANEOUS | Status: DC
  Administered 2015-01-18: 8 [IU] via SUBCUTANEOUS
  Administered 2015-01-19 – 2015-01-20 (×4): 3 [IU] via SUBCUTANEOUS
  Administered 2015-01-21: 8 [IU] via SUBCUTANEOUS
  Administered 2015-01-21 – 2015-01-22 (×3): 3 [IU] via SUBCUTANEOUS
  Administered 2015-01-22: 8 [IU] via SUBCUTANEOUS

## 2015-01-18 MED ORDER — ONDANSETRON HCL 4 MG/2ML IJ SOLN: 4.0000 mg | Freq: Four times a day (QID) | INTRAMUSCULAR | Status: DC | PRN

## 2015-01-18 MED ORDER — FUROSEMIDE 10 MG/ML IJ SOLN: 80.0000 mg | Freq: Two times a day (BID) | INTRAMUSCULAR | Status: DC

## 2015-01-18 MED ORDER — SODIUM CHLORIDE 0.9 % IJ SOLN
3.0000 mL | Freq: Two times a day (BID) | INTRAMUSCULAR | Status: DC
  Administered 2015-01-18: 14:00:00 via INTRAVENOUS
  Administered 2015-01-18: 3 mL via INTRAVENOUS
  Administered 2015-01-20 – 2015-01-21 (×2): 10 mL via INTRAVENOUS
  Administered 2015-01-21 – 2015-01-22 (×2): 3 mL via INTRAVENOUS

## 2015-01-18 MED ORDER — SERTRALINE HCL 50 MG PO TABS
50.0000 mg | ORAL_TABLET | Freq: Every day | ORAL | Status: DC
  Administered 2015-01-19 – 2015-01-22 (×4): 50 mg via ORAL
  Filled 2015-01-18 (×4): qty 1

## 2015-01-18 NOTE — H&P (Signed)
Advanced Heart Failure Team History and Physical Note   Primary Physician: Primary Cardiologist:    Reason for Admission:   Baseline proBNP: Weight Range:  HPI:   [name] is a 69 yo male referred to the HF clinic by Dr.Nelson. He has a history of DM2 with severe diabetic retinopathy legally blind September , cataracts HTN, HLD, chronic systolic HF due to severe, EF 10% (11/14) s/p ICD Medtronic. Has been on milrinone since January 2014 at 0.125 mcg.   Today he presented to HF clinic for follow up. Over the last few weeks he was given IV lasix in the clinic 7/13 and 7/25 with poor response. Started on doxycycline started for R thumb abscess 7/25. Today he is complaining of increased cough. Complaining of fatigue. + Orthopnea. Limited ambulation due to fatigue. Taking all medications. Has [facility] following for home milrinone. His daughter provides all medications.    Review of Systems: [y] = yes, [ ]  = no   General: Weight gain [Y ]; Weight loss [ ] ; Anorexia [ ] ; Fatigue [Y ]; Fever [ ] ; Chills [ ] ; Weakness [ Y]  Cardiac: Chest pain/pressure [ ] ; Resting SOB Blue.Reese ]; Exertional SOB Blue.Reese ]; Orthopnea [ ] ; Pedal Edema [ y]; Palpitations [ ] ; Syncope [ ] ; Presyncope [ ] ; Paroxysmal nocturnal dyspnea[ ]   Pulmonary: Cough Jazmín.Cullens ]; Wheezing[ ] ; Hemoptysis[ ] ; Sputum [ ] ; Snoring [ ]   GI: Vomiting[ ] ; Dysphagia[ ] ; Melena[ ] ; Hematochezia [ ] ; Heartburn[ ] ; Abdominal pain [ ] ; Constipation [ ] ; Diarrhea [ ] ; BRBPR [ ]   GU: Hematuria[ ] ; Dysuria [ ] ; Nocturia[ ]   Vascular: Pain in legs with walking [ ] ; Pain in feet with lying flat [ ] ; Non-healing sores [ ] ; Stroke [ ] ; TIA [ ] ; Slurred speech [ ] ;  Neuro: Headaches[ ] ; Vertigo[ ] ; Seizures[ ] ; Paresthesias[ ] ;Blurred vision [ ] ; Diplopia [ ] ; Vision changes [ ]   Ortho/Skin: Arthritis [ ] ; Joint pain [ ] ; Muscle pain [ ] ; Joint swelling [ ] ; Back Pain [ ] ; Rash [ ]   Psych: Depression[ ] ; Anxiety[ ]   Heme: Bleeding problems [ ] ; Clotting disorders [ ] ;  Anemia [ ]   Endocrine: Diabetes [Y ]; Thyroid dysfunction[ ]   Home Medications Prior to Admission medications   Medication Sig Start Date End Date Taking? Authorizing Provider  aspirin EC 81 MG tablet Take 81 mg by mouth daily.    Historical Provider, MD  Blood Glucose Monitoring Suppl (ACCU-CHEK NANO SMARTVIEW) W/DEVICE KIT by Does not apply route.    Historical Provider, MD  carvedilol (COREG) 3.125 MG tablet TAKE 1 TABLET BY MOUTH TWO TIMES DAILY WITH A MEAL 01/17/15   Jolaine Artist, MD  glucose blood (ACCU-CHEK SMARTVIEW) test strip Use to check blood sugar once daily 02/26/14   Kennyth Arnold, FNP  insulin glargine (LANTUS) 100 UNIT/ML injection Inject 5 Units into the skin at bedtime.  12/23/13   Kennyth Arnold, FNP  milrinone (PRIMACOR) 20 MG/100ML SOLN infusion Inject 7.75 mcg/min into the vein continuous. 10/08/13   Rande Brunt, NP  mirtazapine (REMERON) 7.5 MG tablet take 1 tablet by mouth at bedtime 09/08/14   Kennyth Arnold, FNP  ondansetron (ZOFRAN) 4 MG tablet Take 1 tablet (4 mg total) by mouth every 6 (six) hours as needed for nausea. Patient not taking: Reported on 01/18/2015 02/14/13   Jonetta Osgood, MD  pantoprazole (PROTONIX) 20 MG tablet take 1 tablet by mouth once daily 08/02/14   Kennyth Arnold, FNP  potassium chloride SA (K-DUR,KLOR-CON) 20 MEQ tablet Take 1 tablet (20 mEq total) by mouth 2 (two) times daily. 01/06/15   Jolaine Artist, MD  sertraline (ZOLOFT) 50 MG tablet Take 50 mg by mouth daily.    Historical Provider, MD  simvastatin (ZOCOR) 5 MG tablet Take 5 mg by mouth at bedtime.    Historical Provider, MD  torsemide (DEMADEX) 20 MG tablet Take 80 mg (4 tabs) in am 40 mg (2 tabs) in pm 01/10/15   Jolaine Artist, MD    Past Medical History: Past Medical History  Diagnosis Date  . CHF (congestive heart failure)   . Diabetes mellitus without complication   . Hypertension   . Impaired vision in both eyes     Past Surgical History: Past Surgical  History  Procedure Laterality Date  . Cardiac defibrillator placement    . Esophagogastroduodenoscopy N/A 07/09/2013    Procedure: ESOPHAGOGASTRODUODENOSCOPY (EGD);  Surgeon: Lafayette Dragon, MD;  Location: [facility] ENDOSCOPY;  Service: Endoscopy;  Laterality: N/A;  . Right heart catheterization N/A 07/08/2013    Procedure: RIGHT HEART CATH;  Surgeon: Larey Dresser, MD;  Location: [facility] CATH LAB;  Service: Cardiovascular;  Laterality: N/A;    Family History: Family History  Problem Relation Age of Onset  . Heart disease Father     Social History: History   Social History  . Marital Status: Widowed    Spouse Name: N/A  . Number of Children: N/A  . Years of Education: N/A   Social History Main Topics  . Smoking status: Never Smoker   . Smokeless tobacco: Not on file  . Alcohol Use: No  . Drug Use: No  . Sexual Activity: Not on file   Other Topics Concern  . Not on file   Social History Narrative    Allergies:  Allergies  Allergen Reactions  . Penicillins Nausea And Vomiting  . Sulfa Antibiotics Nausea And Vomiting    Objective:    Vital Signs:   Pulse Rate:  [116] 116 (08/02 1227) Resp:  [18] 18 (08/02 1227) BP: (96)/(72) 96/72 mmHg (08/02 1227) SpO2:  [92 %] 92 % (08/02 1227) Weight:  [152 lb 4.8 oz (69.083 kg)] 152 lb 4.8 oz (69.083 kg) (08/02 1227)   Filed Weights   01/18/15 1227  Weight: 152 lb 4.8 oz (69.083 kg)    Physical Exam: General:  Chronically ill appearing. No resp difficulty. Daughter present  HEENT: normal Neck: supple. JVP to ear . Carotids 2+ bilat; no bruits. No lymphadenopathy or thryomegaly appreciated. Cor: PMI nondisplaced. Tachy Regular rate & rhythm. No rubs or murmurs. + S3  Lungs: clear Abdomen: soft, nontender, nondistended. No hepatosplenomegaly. No bruits or masses. Good bowel sounds. Extremities: no cyanosis, clubbing, rash, R and LLE 3+ edema.  Neuro: alert & orientedx3, cranial nerves grossly intact. moves all 4 extremities w/o  difficulty. Affect pleasant  Telemetry: ST  Labs: Basic Metabolic Panel: No results for input(s): NA, K, CL, CO2, GLUCOSE, BUN, CREATININE, CALCIUM, MG, PHOS in the last 168 hours.  Liver Function Tests: No results for input(s): AST, ALT, ALKPHOS, BILITOT, PROT, ALBUMIN in the last 168 hours. No results for input(s): LIPASE, AMYLASE in the last 168 hours. No results for input(s): AMMONIA in the last 168 hours.  CBC: No results for input(s): WBC, NEUTROABS, HGB, HCT, MCV, PLT in the last 168 hours.  Cardiac Enzymes: No results for input(s): CKTOTAL, CKMB, CKMBINDEX, TROPONINI in the last 168 hours.  BNP: BNP (last 3 results)  Recent Labs  12/29/14 1540  BNP 1290.9*    ProBNP (last 3 results) No results for input(s): PROBNP in the last 8760 hours.   CBG: No results for input(s): GLUCAP in the last 168 hours.  Coagulation Studies: No results for input(s): LABPROT, INR in the last 72 hours.  Other results: EKG: pending  Imaging:  No results found.      Assessment:  1. A/C Biventricular HF on chronic home milrinone- Has medtronic ICD 2. Diabetes with diabetic retinopathy 3. SInus Tach  4. DNR    Plan/Discussion:    Mr Corsetti is admitted from HF clinic with marked volume overload despite attempts to diurese with IV lasix in the community. Start lasix 80 mg IV twice daily. Continue milrinone 0.125 mcg but may need to increase if CO-OX low. No bb with low output. No Ace with soft BP.   Check labs, CXR, ECHO   He is a DNR  Length of Stay: 0   CLEGG,[name] 01/18/2015, 1:50 PM  Advanced Heart Failure Team Pager [phone] (M-F; 7a - 4p)  Please contact [facility] for night-coverage after hours (4p -7a ) and weekends on [email or web address]  Patient seen and examined with Darrick Grinder, NP. We discussed all aspects of the encounter. I agree with the assessment and plan as stated above.   He has worsening HF with significant volume overload despite low-dose home  milrinone. Will admit for IV diuresis. If not responding will move to SDU to measure co-ox and CVP. May need to increase milrinone.   Bensimhon, Daniel,MD 5:17 PM

## 2015-01-18 NOTE — Progress Notes (Signed)
Patient ID: Gary Gallegos, male   DOB: 08/23/1945, 69 y.o.   MRN: [id number]  PCP: Dr. Dutch Quint, FNP Primary Cardiologist: Dr. Ena Dawley  HPI: [name] is a 69 yo male referred to the HF clinic by Dr.Nelson. He has a history of DM2 with severe diabetic retinopathy legally blind September , cataracts HTN, HLD, chronic systolic HF due to severe, EF 10% (11/14) s/p ICD Medtronic.   Started having problems with his heart in early 2013. Was followed by cardiologist at [facility]. Had cath in 2013 and arteries were "fine".  Admitted to [facility] in January with N/V/D. EGD  Showed severe gastritis and esophagitis. Started on Milrinone 0.25 mcg via PICC. Discharge weight 133 pounds. Followed by [facility].   He presents today for HF follow up. He has received IV lasix 7/13 and 7/25 with poor response. Started on doxycycline started for R thumb abscess. Coughing a lot. SOB with exertion. Complaining of fatigue. + Orthopnea. Taking all medications. Has [facility] following for home milrinone.   ECHO 05/05/13: EF 10%, diff HK, grade II DD, mild MR, LA mod dilated, RV mildly dilated and sys fx mod reduced, mod TR ECHO 11/04/13  EF 10% mod-severe MR RV mod reduced. TAPSE 1.6   Labs 07/15/13 K 5.7 Creatinine 1.95 instructed to cut lisinopril 5 mg daily.  Labs 07/22/13 K 3.6 Creatinine 0.81 Dig 1.9 digoxin stopped Labs  08/17/13 K 3.7 Creatinine 0.8 Glucose 190           08/20/13: K+ 3.7, Creatinine 0.70, Glucose 123, Na 137           10/22/13: K 3.9, creatinine 0.69, glucose 179           02/11/14: K 4.3 cr 1.2           07/05/2014: K 5.4 Creatinine 0.96 potassium cut back to 20 meq once a day.            07/12/2014: K 4.4 Creatinine 0.95             11/09/2014: K 3.6 Creatinine 1.04             6/16: K 3.9, creatinine 1.01, hgb 11.9             12/29/14: K 3.9 Creatinine 1.19   SH: Lives with his daughter, son in law, and 2 granddaughters.     ROS: All systems negative except as listed in HPI, PMH and Problem  List.  Past Medical History  Diagnosis Date  . CHF (congestive heart failure)   . Diabetes mellitus without complication   . Hypertension   . Impaired vision in both eyes     Current Outpatient Prescriptions  Medication Sig Dispense Refill  . aspirin EC 81 MG tablet Take 81 mg by mouth daily.    . Blood Glucose Monitoring Suppl (ACCU-CHEK NANO SMARTVIEW) W/DEVICE KIT by Does not apply route.    . carvedilol (COREG) 3.125 MG tablet TAKE 1 TABLET BY MOUTH TWO TIMES DAILY WITH A MEAL 60 tablet 1  . glucose blood (ACCU-CHEK SMARTVIEW) test strip Use to check blood sugar once daily 100 each 12  . insulin glargine (LANTUS) 100 UNIT/ML injection Inject 5 Units into the skin at bedtime.     . milrinone (PRIMACOR) 20 MG/100ML SOLN infusion Inject 7.75 mcg/min into the vein continuous. 100 mL   . mirtazapine (REMERON) 7.5 MG tablet take 1 tablet by mouth at bedtime 90 tablet 0  . pantoprazole (PROTONIX) 20 MG tablet  take 1 tablet by mouth once daily 30 tablet 5  . potassium chloride SA (K-DUR,KLOR-CON) 20 MEQ tablet Take 1 tablet (20 mEq total) by mouth 2 (two) times daily. 62 tablet 3  . sertraline (ZOLOFT) 50 MG tablet Take 50 mg by mouth daily.    . simvastatin (ZOCOR) 5 MG tablet Take 5 mg by mouth at bedtime.    . torsemide (DEMADEX) 20 MG tablet Take 80 mg (4 tabs) in am 40 mg (2 tabs) in pm 180 tablet 6  . ondansetron (ZOFRAN) 4 MG tablet Take 1 tablet (4 mg total) by mouth every 6 (six) hours as needed for nausea. (Patient not taking: Reported on 01/18/2015) 20 tablet 0   No current facility-administered medications for this encounter.    Filed Vitals:   01/18/15 1051  BP: 106/68  Pulse: 100  Weight: 159 lb 6 oz (72.292 kg)  SpO2: 99%    PHYSICAL EXAM: General: Chronically ill appearing. SOB walking in the clinic. Daughter present  HEENT: normal; arcus senilus.  Neck: supple. JVP to ear . Carotids 2+ bilaterally; no bruits. No lymphadenopathy or thryomegaly appreciated. Cor: PMI  laterally displaced.Tachy regular. No rubs. + S3; +7/6 systolic murmur   Lungs: clear Abdomen:  nontender, ++distended. No hepatosplenomegaly. No bruits or masses. Good bowel sounds. Extremities: no cyanosis, clubbing, rash; RUE picc; 2+ edema to knees. Neuro: alert & orientedx3, cranial nerves grossly intact. Moves all 4 extremities w/o difficulty. Affect pleasant.  ASSESSMENT & PLAN:  1) Acute/Chronic biventricular HF: inotrope dependent.  NICM s/p ICD. EF 10% mod-severe MR RV mod reduced. He is on chronic home milrinone. NYHA class III -IV symptoms with marked volume overload.   Give IV lasix 80 mg twice a day and start CVPs.   - Continue current milrinone. - Continue coreg 3.125 mg BID. - No ACEI with soft bp.  - Reinforced the need and importance of daily weights, a low sodium diet, and fluid restriction (less than 2 L a day). Instructed to call the HF clinic if weight increases more than 3 lbs overnight or 5 lbs in a week. 2)  Diabetes with diabetic retinopathy-   Start sliding scale.  k3) Sinus Tachycardia- due to HF Not candidate for ivabradine due to shock/milrinone.  3) HTN- Stable will continue current medications.  4) R thumb Abscess- resolved. Completed 7 day course doxycycline. Excisional sharp debridement performed with scissor and forcep.   5) DNR  Admit to step down to diurese with IV lasix. Check CVP CO-OX .   Sritha Chauncey NP-C  10:55 AM

## 2015-01-18 NOTE — Progress Notes (Signed)
Utilization review completed.  

## 2015-01-18 NOTE — Progress Notes (Signed)
Patient had episode of vomiting after eating dinner.

## 2015-01-18 NOTE — Care Management Note (Signed)
Case Management Note Donn Pierini RN, BSN Unit 2W-Case Manager [phone] Covering 3W  Patient Details  Name: Gary Gallegos MRN: [id number] Date of Birth: 11/27/1945  Subjective/Objective:  Pt admitted with acute on chronic CHF                  Action/Plan: PTA pt lived at home - is active with [facility] for home IV milrinone- will need resumption of HH services at time of discharge- NCM to follow  Expected Discharge Date:                  Expected Discharge Plan:  Home w Home Health Services  In-House Referral:     Discharge planning Services  CM Consult  Post Acute Care Choice:  Resumption of Svcs/PTA Provider Choice offered to:     DME Arranged:    DME Agency:     HH Arranged:    HH Agency:  [facility]  Status of Service:  In process, will continue to follow  Medicare Important Message Given:    Date Medicare IM Given:    Medicare IM give by:    Date Additional Medicare IM Given:    Additional Medicare Important Message give by:     If discussed at Long Length of Stay Meetings, dates discussed:    Additional Comments:  Darrold Span, RN 01/18/2015, 3:59 PM

## 2015-01-19 ENCOUNTER — Inpatient Hospital Stay (HOSPITAL_COMMUNITY): Payer: Medicare Other

## 2015-01-19 ENCOUNTER — Encounter (HOSPITAL_COMMUNITY): Payer: Self-pay | Admitting: Cardiology

## 2015-01-19 ENCOUNTER — Ambulatory Visit (HOSPITAL_COMMUNITY): Payer: Medicare Other

## 2015-01-19 DIAGNOSIS — R570 Cardiogenic shock: Secondary | ICD-10-CM

## 2015-01-19 DIAGNOSIS — I509 Heart failure, unspecified: Secondary | ICD-10-CM

## 2015-01-19 LAB — CARBOXYHEMOGLOBIN
CARBOXYHEMOGLOBIN: 0.8 % (ref 0.5–1.5)
CARBOXYHEMOGLOBIN: 1.1 % (ref 0.5–1.5)
Methemoglobin: 1 % (ref 0.0–1.5)
Methemoglobin: 1.3 % (ref 0.0–1.5)
O2 SAT: 30.9 %
O2 SAT: 39.2 %
TOTAL HEMOGLOBIN: 12.2 g/dL — AB (ref 13.5–18.0)
Total hemoglobin: 11.9 g/dL — ABNORMAL LOW (ref 13.5–18.0)

## 2015-01-19 LAB — GLUCOSE, CAPILLARY
GLUCOSE-CAPILLARY: 176 mg/dL — AB (ref 65–99)
Glucose-Capillary: 78 mg/dL (ref 65–99)
Glucose-Capillary: 198 mg/dL — ABNORMAL HIGH (ref 65–99)
Glucose-Capillary: 139 mg/dL — ABNORMAL HIGH (ref 65–99)

## 2015-01-19 LAB — BASIC METABOLIC PANEL
ANION GAP: 11 (ref 5–15)
BUN: 32 mg/dL — AB (ref 6–20)
CO2: 18 mmol/L — ABNORMAL LOW (ref 22–32)
Calcium: 8.9 mg/dL (ref 8.9–10.3)
Chloride: 108 mmol/L (ref 101–111)
Creatinine, Ser: 1.32 mg/dL — ABNORMAL HIGH (ref 0.61–1.24)
GFR calc non Af Amer: 54 mL/min — ABNORMAL LOW (ref >60)
GLUCOSE: 75 mg/dL (ref 65–99)
Potassium: 4 mmol/L (ref 3.5–5.1)
SODIUM: 137 mmol/L (ref 135–145)

## 2015-01-19 LAB — TROPONIN I: Troponin I: <0.03 ng/mL (ref <0.031)

## 2015-01-19 LAB — HEMOGLOBIN A1C
Hgb A1c MFr Bld: 11.9 % — ABNORMAL HIGH (ref 4.8–5.6)
Mean Plasma Glucose: 295 mg/dL

## 2015-01-19 MED ORDER — MILRINONE IN DEXTROSE 20 MG/100ML IV SOLN
0.3750 ug/kg/min | INTRAVENOUS | Status: DC
  Administered 2015-01-20 – 2015-01-22 (×4): 0.375 ug/kg/min via INTRAVENOUS
  Filled 2015-01-19 (×5): qty 100

## 2015-01-19 MED ORDER — FUROSEMIDE 10 MG/ML IJ SOLN
15.0000 mg/h | INTRAVENOUS | Status: DC
  Administered 2015-01-19 – 2015-01-20 (×2): 15 mg/h via INTRAVENOUS
  Filled 2015-01-19 (×6): qty 25

## 2015-01-19 MED ORDER — DEXTROMETHORPHAN POLISTIREX ER 30 MG/5ML PO SUER
30.0000 mg | Freq: Four times a day (QID) | ORAL | Status: DC | PRN
  Administered 2015-01-19 – 2015-01-21 (×6): 30 mg via ORAL
  Filled 2015-01-19 (×9): qty 5

## 2015-01-19 NOTE — Progress Notes (Signed)
Paged cards Fellow for some form of cough suppressor. Pt having a dry, non-productive cough while sleeping

## 2015-01-19 NOTE — Progress Notes (Signed)
*  PRELIMINARY RESULTS* Echocardiogram 2D Echocardiogram has been performed.  Jeryl Columbia 01/19/2015, 11:37 AM

## 2015-01-19 NOTE — Progress Notes (Signed)
Pt admitted directly from CHF clinic on 01/18/15 Cpt code 48185 With patients current insurance- [employer]/Med[employer]are  NO PRE CERT REQUIRED

## 2015-01-19 NOTE — Progress Notes (Signed)
Placed condom cath on pt 3 times last night. Total urine collected in bag was 34ml measured

## 2015-01-19 NOTE — Progress Notes (Signed)
Advanced Heart Failure Rounding Note   Subjective:     Admitted from HF clinic with volume overload. CO-OX on admit was 32%. Started on IV lasix and continued on milrinone 0.125 mcg. Limited diuresis noted.   SOB with exertion. Complaining of cough.    Objective:   Weight Range:  Vital Signs:   Temp:  [97.5 F (36.4 C)-98.2 F (36.8 C)] 97.9 F (36.6 C) (08/03 0726) Pulse Rate:  [100-116] 108 (08/03 0435) Resp:  [11-41] 22 (08/03 0530) BP: (96-113)/(61-87) 103/61 mmHg (08/03 0435) SpO2:  [92 %-100 %] 100 % (08/03 0435) Weight:  [152 lb 1.6 oz (68.992 kg)-159 lb 6 oz (72.292 kg)] 152 lb 1.6 oz (68.992 kg) (08/03 0500) Last BM Date: 01/18/15  Weight change: Filed Weights   01/18/15 1227 01/19/15 0500  Weight: 152 lb 4.8 oz (69.083 kg) 152 lb 1.6 oz (68.992 kg)    Intake/Output:   Intake/Output Summary (Last 24 hours) at 01/19/15 0908 Last data filed at 01/19/15 0843  Gross per 24 hour  Intake    290 ml  Output    425 ml  Net   -135 ml     Physical Exam: CVP 21  General:  Fatigued appearing. No resp difficulty HEENT: normal Neck: supple. JVP to ear  . Carotids 2+ bilat; no bruits. No lymphadenopathy or thryomegaly appreciated. Cor: PMI nondisplaced. Tachy, Regular rate & rhythm. No rubs, or murmurs. + S3  Lungs: clear Abdomen: soft, nontender, nondistended. No hepatosplenomegaly. No bruits or masses. Good bowel sounds. Extremities: no cyanosis, clubbing, rash, R and LLE 2+ edema Neuro: alert & orientedx3, cranial nerves grossly intact. moves all 4 extremities w/o difficulty. Affect pleasant  Telemetry: Sinus Tach 100s   Labs: Basic Metabolic Panel:  Recent Labs Lab 01/18/15 1418 01/19/15 0022  NA 135 137  K 4.2 4.0  CL 108 108  CO2 18* 18*  GLUCOSE 320* 75  BUN 32* 32*  CREATININE 1.29* 1.32*  CALCIUM 8.6* 8.9  MG 2.0  --     Liver Function Tests:  Recent Labs Lab 01/18/15 1418  AST 25  ALT 11*  ALKPHOS 130*  BILITOT 2.4*  PROT 6.4*   ALBUMIN 3.3*   No results for input(s): LIPASE, AMYLASE in the last 168 hours. No results for input(s): AMMONIA in the last 168 hours.  CBC:  Recent Labs Lab 01/18/15 1418  WBC 5.5  NEUTROABS 4.1  HGB 11.7*  HCT 36.4*  MCV 77.0*  PLT 246    Cardiac Enzymes:  Recent Labs Lab 01/18/15 1418 01/18/15 1559 01/19/15 0022  TROPONINI <0.03 <0.03 <0.03    BNP: BNP (last 3 results)  Recent Labs  12/29/14 1540 01/18/15 1418  BNP 1290.9* 1896.9*    ProBNP (last 3 results) No results for input(s): PROBNP in the last 8760 hours.    Other results:  Imaging: Dg Chest Port 1 View  01/19/2015   CLINICAL DATA:  Dyspnea; acute and chronic CHF, diabetes, cirrhosis.  EXAM: PORTABLE CHEST - 1 VIEW  COMPARISON:  PA and lateral chest of February 08, 2014  FINDINGS: The lungs are adequately inflated. The cardiopericardial silhouette is markedly increased in size. The pulmonary vascularity is only minimally prominent. There is no significant interstitial edema. There is no pleural effusion or pneumothorax. A right internal jugular venous catheter tip projects at the cavoatrial junction. The permanent AICD is in stable position. The bony structures exhibit no acute abnormality.  IMPRESSION: Marked enlargement of the cardiac silhouette since the previous study. Minimal central  pulmonary vascular prominence. There is no interstitial or alveolar edema.  An echo cardiogram it would be useful to exclude a pericardial effusion.   Electronically Signed   By: David  [place] M.D.   On: 01/19/2015 07:47      Medications:     Scheduled Medications: . aspirin EC  81 mg Oral Daily  . enoxaparin (LOVENOX) injection  40 mg Subcutaneous Q24H  . furosemide  80 mg Intravenous BID  . insulin aspart  0-15 Units Subcutaneous TID WC  . insulin glargine  5 Units Subcutaneous QHS  . mirtazapine  7.5 mg Oral QHS  . pantoprazole  20 mg Oral Daily  . potassium chloride SA  20 mEq Oral BID  . sertraline  50 mg  Oral Daily  . simvastatin  5 mg Oral QHS  . sodium chloride  3 mL Intravenous Q12H     Infusions: . milrinone 0.125 mcg/kg/min (01/18/15 1344)     PRN Medications:  sodium chloride, acetaminophen, dextromethorphan, ondansetron (ZOFRAN) IV, ondansetron, sodium chloride   Assessment:   1. 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 Diabetes with diabetic retinopathy 3. SInus Tach  4. DNR   Plan/Discussion:    CO-OX on admit 32%. Repeat 30%. Increase milrinone to 0.25 mcg per hour. Repeat CO-OX at 1400. Limited diuresis with intermittent lasix. Change to laix drip 15 mg per hour. Place foley. No bb/ace with low output and soft BP.   Renal function stable. ECHO today   IF unable to diurese will need palliative care.   Length of Stay: 1   CLEGG,[name]  01/19/2015, 9:08 AM  Advanced Heart Failure Team Pager [phone] (M-F; 7a - 4p)  Please contact [facility] for night-coverage after hours (4p -7a ) and weekends on [email or web address]  Patient seen and examined with [name], NP. We discussed all aspects of the encounter. I agree with the assessment and plan as stated above.   He has cardiogenic shock physiology despite milrinone support. Will increase to 0.375. Change to lasix gtt. If co-ox not improving can consider switch to dobutamine. Not candidate for VAD or Tx.   Bensimhon, Daniel,MD 1:37 PM

## 2015-01-19 NOTE — Progress Notes (Signed)
[facility]  Patient Status:   Active pt with [facility] prior to this admission  [facility] is providing the following services: HHRN and [facility] for home inotropes.  [facility] hospital team will follow Gary Gallegos while inpatient and support DC to home when ordered by HF Team.   If patient discharges after hours, please call [phone].   Sedalia Muta 01/19/2015, 12:17 AM

## 2015-01-20 LAB — CARBOXYHEMOGLOBIN
Carboxyhemoglobin: 1.1 % (ref 0.5–1.5)
Methemoglobin: 0.9 % (ref 0.0–1.5)
O2 Saturation: 49.7 %
Total hemoglobin: 11.4 g/dL — ABNORMAL LOW (ref 13.5–18.0)

## 2015-01-20 LAB — BASIC METABOLIC PANEL
Anion gap: 6 (ref 5–15)
BUN: 27 mg/dL — AB (ref 6–20)
CO2: 22 mmol/L (ref 22–32)
CREATININE: 1.17 mg/dL (ref 0.61–1.24)
Calcium: 8.3 mg/dL — ABNORMAL LOW (ref 8.9–10.3)
Chloride: 108 mmol/L (ref 101–111)
GFR calc Af Amer: >60 mL/min (ref >60)
GFR calc non Af Amer: >60 mL/min (ref >60)
GLUCOSE: 139 mg/dL — AB (ref 65–99)
POTASSIUM: 3.6 mmol/L (ref 3.5–5.1)
SODIUM: 136 mmol/L (ref 135–145)

## 2015-01-20 LAB — GLUCOSE, CAPILLARY
Glucose-Capillary: 119 mg/dL — ABNORMAL HIGH (ref 65–99)
Glucose-Capillary: 185 mg/dL — ABNORMAL HIGH (ref 65–99)
Glucose-Capillary: 194 mg/dL — ABNORMAL HIGH (ref 65–99)
Glucose-Capillary: 194 mg/dL — ABNORMAL HIGH (ref 65–99)

## 2015-01-20 LAB — MAGNESIUM: Magnesium: 1.9 mg/dL (ref 1.7–2.4)

## 2015-01-20 MED ORDER — POTASSIUM CHLORIDE CRYS ER 20 MEQ PO TBCR
40.0000 meq | EXTENDED_RELEASE_TABLET | Freq: Two times a day (BID) | ORAL | Status: DC
  Administered 2015-01-20 – 2015-01-22 (×4): 40 meq via ORAL
  Filled 2015-01-20 (×4): qty 2

## 2015-01-20 MED ORDER — METOLAZONE 5 MG PO TABS
2.5000 mg | ORAL_TABLET | Freq: Once | ORAL | Status: AC
  Administered 2015-01-20: 2.5 mg via ORAL
  Filled 2015-01-20: qty 1

## 2015-01-20 NOTE — Progress Notes (Signed)
Advanced Heart Failure Rounding Note   Subjective:     Admitted from HF clinic with volume overload. CO-OX on admit was 32%.   Yesterday milrinone increased to 0.375 mcg and switched to lasix drip. CO-OX up to 50%. CVP remains high 19.   SOB with exertion. Complaining of cough.    ECHO 8/3 EF 20-25% IVC dilated. RV moderately dilated.   Objective:   Weight Range:  Vital Signs:   Temp:  [97.7 F (36.5 C)-98.9 F (37.2 C)] 98.7 F (37.1 C) (08/04 0729) Pulse Rate:  [108-119] 118 (08/04 0400) Resp:  [20-21] 21 (08/03 2018) BP: (104-118)/(60-82) 118/60 mmHg (08/04 0400) SpO2:  [93 %-100 %] 98 % (08/04 0400) Weight:  [152 lb 11.2 oz (69.264 kg)] 152 lb 11.2 oz (69.264 kg) (08/04 0400) Last BM Date: 01/19/15  Weight change: Filed Weights   01/18/15 1227 01/19/15 0500 01/20/15 0400  Weight: 152 lb 4.8 oz (69.083 kg) 152 lb 1.6 oz (68.992 kg) 152 lb 11.2 oz (69.264 kg)    Intake/Output:   Intake/Output Summary (Last 24 hours) at 01/20/15 0747 Last data filed at 01/20/15 0615  Gross per 24 hour  Intake    480 ml  Output   2175 ml  Net  -1695 ml     Physical Exam: CVP 19   General:  Fatigued appearing. No resp difficulty. In bed  HEENT: normal Neck: supple. JVP to ear  . Carotids 2+ bilat; no bruits. No lymphadenopathy or thryomegaly appreciated. Cor: PMI nondisplaced. Tachy, Regular rate & rhythm. No rubs, or murmurs. + S3  Lungs: clear Abdomen: soft, nontender, nondistended. No hepatosplenomegaly. No bruits or masses. Good bowel sounds. Extremities: no cyanosis, clubbing, rash, R and LLE 2+ edema Neuro: alert & orientedx3, cranial nerves grossly intact. moves all 4 extremities w/o difficulty. Affect pleasant GU: Foley   Telemetry: Sinus Tach 100s   Labs: Basic Metabolic Panel:  Recent Labs Lab 01/18/15 1418 01/19/15 0022 01/20/15 0430  NA 135 137 136  K 4.2 4.0 3.6  CL 108 108 108  CO2 18* 18* 22  GLUCOSE 320* 75 139*  BUN 32* 32* 27*  CREATININE  1.29* 1.32* 1.17  CALCIUM 8.6* 8.9 8.3*  MG 2.0  --   --     Liver Function Tests:  Recent Labs Lab 01/18/15 1418  AST 25  ALT 11*  ALKPHOS 130*  BILITOT 2.4*  PROT 6.4*  ALBUMIN 3.3*   No results for input(s): LIPASE, AMYLASE in the last 168 hours. No results for input(s): AMMONIA in the last 168 hours.  CBC:  Recent Labs Lab 01/18/15 1418  WBC 5.5  NEUTROABS 4.1  HGB 11.7*  HCT 36.4*  MCV 77.0*  PLT 246    Cardiac Enzymes:  Recent Labs Lab 01/18/15 1418 01/18/15 1559 01/19/15 0022  TROPONINI <0.03 <0.03 <0.03    BNP: BNP (last 3 results)  Recent Labs  12/29/14 1540 01/18/15 1418  BNP 1290.9* 1896.9*    ProBNP (last 3 results) No results for input(s): PROBNP in the last 8760 hours.    Other results:  Imaging: Dg Chest Port 1 View  01/19/2015   CLINICAL DATA:  Dyspnea; acute and chronic CHF, diabetes, cirrhosis.  EXAM: PORTABLE CHEST - 1 VIEW  COMPARISON:  PA and lateral chest of February 08, 2014  FINDINGS: The lungs are adequately inflated. The cardiopericardial silhouette is markedly increased in size. The pulmonary vascularity is only minimally prominent. There is no significant interstitial edema. There is no pleural effusion or pneumothorax.  A right internal jugular venous catheter tip projects at the cavoatrial junction. The permanent AICD is in stable position. The bony structures exhibit no acute abnormality.  IMPRESSION: Marked enlargement of the cardiac silhouette since the previous study. Minimal central pulmonary vascular prominence. There is no interstitial or alveolar edema.  An echo cardiogram it would be useful to exclude a pericardial effusion.   Electronically Signed   By: David  [place] M.D.   On: 01/19/2015 07:47     Medications:     Scheduled Medications: . aspirin EC  81 mg Oral Daily  . enoxaparin (LOVENOX) injection  40 mg Subcutaneous Q24H  . insulin aspart  0-15 Units Subcutaneous TID WC  . insulin glargine  5 Units  Subcutaneous QHS  . mirtazapine  7.5 mg Oral QHS  . pantoprazole  20 mg Oral Daily  . potassium chloride SA  20 mEq Oral BID  . sertraline  50 mg Oral Daily  . simvastatin  5 mg Oral QHS  . sodium chloride  3 mL Intravenous Q12H    Infusions: . furosemide (LASIX) infusion 15 mg/hr (01/19/15 1800)  . milrinone 0.375 mcg/kg/min (01/20/15 0351)    PRN Medications: sodium chloride, acetaminophen, dextromethorphan, ondansetron (ZOFRAN) IV, ondansetron, sodium chloride   Assessment:   1. 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 Diabetes with diabetic retinopathy 3. SInus Tach  4. DNR   Plan/Discussion:    CO-OX today up to 50%. On Milrinone 0.375 mcg. Weight unchanged despite lasix drip? CVP 19 . Continue lasix drip and add metolazone. No bb/ace with low output and soft BP. Renal function stable.   ECHO EF 20-25% today RV moderately dilated.   O2 sat sleeping-> 84%. Add 2 liters continuous oxygen.   Consult PT  IF unable to diurese will need palliative care.   Length of Stay: 2   CLEGG,[name]  01/20/2015, 7:47 AM  Advanced Heart Failure Team Pager [phone] (M-F; 7a - 4p)  Please contact [facility] for night-coverage after hours (4p -7a ) and weekends on [email or web address]   Patient seen and examined with [name], NP. We discussed all aspects of the encounter. I agree with the assessment and plan as stated above.   Co-ox improved on higher dose milrinone but still very tenuous. Starting to diuresis. Would continue on same regimen for now. If co-ox remains < 55% tomorrow consider switching milrinone to dobutamine.   Don Tiu,MD 1:53 PM

## 2015-01-21 DIAGNOSIS — N183 Chronic kidney disease, stage 3 (moderate): Secondary | ICD-10-CM

## 2015-01-21 LAB — BASIC METABOLIC PANEL
Anion gap: 9 (ref 5–15)
BUN: 22 mg/dL — ABNORMAL HIGH (ref 6–20)
CHLORIDE: 96 mmol/L — AB (ref 101–111)
CO2: 31 mmol/L (ref 22–32)
CREATININE: 1.14 mg/dL (ref 0.61–1.24)
Calcium: 8.7 mg/dL — ABNORMAL LOW (ref 8.9–10.3)
GFR calc Af Amer: >60 mL/min (ref >60)
GFR calc non Af Amer: >60 mL/min (ref >60)
GLUCOSE: 201 mg/dL — AB (ref 65–99)
Potassium: 3.4 mmol/L — ABNORMAL LOW (ref 3.5–5.1)
Sodium: 136 mmol/L (ref 135–145)

## 2015-01-21 LAB — GLUCOSE, CAPILLARY
GLUCOSE-CAPILLARY: 288 mg/dL — AB (ref 65–99)
Glucose-Capillary: 187 mg/dL — ABNORMAL HIGH (ref 65–99)
Glucose-Capillary: 181 mg/dL — ABNORMAL HIGH (ref 65–99)
Glucose-Capillary: 176 mg/dL — ABNORMAL HIGH (ref 65–99)

## 2015-01-21 LAB — CARBOXYHEMOGLOBIN
CARBOXYHEMOGLOBIN: 1.3 % (ref 0.5–1.5)
METHEMOGLOBIN: 0.9 % (ref 0.0–1.5)
O2 Saturation: 51.8 %
Total hemoglobin: 11.8 g/dL — ABNORMAL LOW (ref 13.5–18.0)

## 2015-01-21 LAB — MAGNESIUM: Magnesium: 1.9 mg/dL (ref 1.7–2.4)

## 2015-01-21 MED ORDER — TORSEMIDE 20 MG PO TABS: 40.0000 mg | ORAL_TABLET | Freq: Two times a day (BID) | ORAL | Status: DC

## 2015-01-21 MED ORDER — POTASSIUM CHLORIDE CRYS ER 20 MEQ PO TBCR
40.0000 meq | EXTENDED_RELEASE_TABLET | Freq: Once | ORAL | Status: AC
  Administered 2015-01-21: 40 meq via ORAL
  Filled 2015-01-21: qty 2

## 2015-01-21 MED ORDER — TORSEMIDE 20 MG PO TABS
80.0000 mg | ORAL_TABLET | Freq: Every day | ORAL | Status: DC
  Administered 2015-01-22: 80 mg via ORAL
  Filled 2015-01-21: qty 4

## 2015-01-21 NOTE — Progress Notes (Signed)
Utilization review completed.  

## 2015-01-21 NOTE — Care Management Note (Signed)
Case Management Note Donn Pierini RN, BSN Unit 2W-Case Manager [phone] Covering 3W  Patient Details  Name: Gary Gallegos MRN: [id number] Date of Birth: Jul 11, 1945  Subjective/Objective:  Pt admitted with acute on chronic CHF                  Action/Plan: PTA pt lived at home - is active with [facility] for home IV milrinone- will need resumption of HH services at time of discharge- NCM to follow  Expected Discharge Date:       01/22/15           Expected Discharge Plan:  Home w Home Health Services  In-House Referral:     Discharge planning Services  CM Consult  Post Acute Care Choice:  Resumption of Svcs/PTA Provider, Home Health Choice offered to:     DME Arranged:  Oxygen DME Agency:  [facility]  HH Arranged:  Disease Management HH Agency:  [facility]  Status of Service:  Completed, signed off  Medicare Important Message Given:  Yes-second notification given Date Medicare IM Given:    Medicare IM give by:    Date Additional Medicare IM Given:    Additional Medicare Important Message give by:     If discussed at Long Length of Stay Meetings, dates discussed:    Additional Comments:   01/21/15- pt for possible d/c over weekend have alerted [facility] (Pam and Tiffany) of potential d/c and change in home IV milrinone dose- script on chart for d/c -Tiffany to come get for [facility]- pt also will need home 02 for night time use- have spoken with Jermaine at [facility]- regarding home 02 needs- pt will need night pulse oximetry at home to assist with qualifying home 02 spoke with [name] PA with HF for order- will need co-sign prior to pt's discharge in order for [facility] to complete home 02 order process.   Darrold Span, RN 01/21/2015, 9:46 PM

## 2015-01-21 NOTE — Progress Notes (Signed)
Pt has had many incont episodes after foley catheter removed. RN placed Condom cath

## 2015-01-21 NOTE — Progress Notes (Addendum)
Advanced Heart Failure Rounding Note   Subjective:     Admitted from HF clinic with volume overload. CO-OX on admit was 32%.   CO-OX on milrinone 0.375 mcg  51%. Brisk diuresis noted. CVP now 6.    Denies SOB.     ECHO 8/3 EF 20-25% IVC dilated. RV moderately dilated.   Objective:   Weight Range:  Vital Signs:   Temp:  [98.2 F (36.8 C)-100.1 F (37.8 C)] 98.4 F (36.9 C) (08/05 0730) Pulse Rate:  [66-123] 110 (08/05 0300) Resp:  [19-37] 19 (08/05 0100) BP: (109-119)/(47-92) 109/82 mmHg (08/05 0300) SpO2:  [89 %-100 %] 100 % (08/05 0300) Weight:  [140 lb 14.4 oz (63.912 kg)] 140 lb 14.4 oz (63.912 kg) (08/05 0300) Last BM Date: 01/20/15  Weight change: Filed Weights   01/19/15 0500 01/20/15 0400 01/21/15 0300  Weight: 152 lb 1.6 oz (68.992 kg) 152 lb 11.2 oz (69.264 kg) 140 lb 14.4 oz (63.912 kg)    Intake/Output:   Intake/Output Summary (Last 24 hours) at 01/21/15 0814 Last data filed at 01/21/15 0446  Gross per 24 hour  Intake    250 ml  Output   8420 ml  Net  -8170 ml     Physical Exam: CVP 6-7    General:  Fatigued appearing. No resp difficulty. In bed  HEENT: normal Neck: supple. JVP 7-8  . Carotids 2+ bilat; no bruits. No lymphadenopathy or thryomegaly appreciated. Cor: PMI nondisplaced. Tachy, Regular rate & rhythm. No rubs, or murmurs. + S3  Lungs: clear Abdomen: soft, nontender, nondistended. No hepatosplenomegaly. No bruits or masses. Good bowel sounds. Extremities: no cyanosis, clubbing, rash, R and LLE edema Neuro: alert & orientedx3, cranial nerves grossly intact. moves all 4 extremities w/o difficulty. Affect pleasant GU: Foley   Telemetry: Sinus Tach 100s   Labs: Basic Metabolic Panel:  Recent Labs Lab 01/18/15 1418 01/19/15 0022 01/20/15 0430 01/20/15 1440 01/21/15 0454  NA 135 137 136  --  136  K 4.2 4.0 3.6  --  3.4*  CL 108 108 108  --  96*  CO2 18* 18* 22  --  31  GLUCOSE 320* 75 139*  --  201*  BUN 32* 32* 27*  --  22*   CREATININE 1.29* 1.32* 1.17  --  1.14  CALCIUM 8.6* 8.9 8.3*  --  8.7*  MG 2.0  --   --  1.9 1.9    Liver Function Tests:  Recent Labs Lab 01/18/15 1418  AST 25  ALT 11*  ALKPHOS 130*  BILITOT 2.4*  PROT 6.4*  ALBUMIN 3.3*   No results for input(s): LIPASE, AMYLASE in the last 168 hours. No results for input(s): AMMONIA in the last 168 hours.  CBC:  Recent Labs Lab 01/18/15 1418  WBC 5.5  NEUTROABS 4.1  HGB 11.7*  HCT 36.4*  MCV 77.0*  PLT 246    Cardiac Enzymes:  Recent Labs Lab 01/18/15 1418 01/18/15 1559 01/19/15 0022  TROPONINI <0.03 <0.03 <0.03    BNP: BNP (last 3 results)  Recent Labs  12/29/14 1540 01/18/15 1418  BNP 1290.9* 1896.9*    ProBNP (last 3 results) No results for input(s): PROBNP in the last 8760 hours.    Other results:  Imaging: No results found.   Medications:     Scheduled Medications: . aspirin EC  81 mg Oral Daily  . enoxaparin (LOVENOX) injection  40 mg Subcutaneous Q24H  . insulin aspart  0-15 Units Subcutaneous TID WC  . insulin  glargine  5 Units Subcutaneous QHS  . mirtazapine  7.5 mg Oral QHS  . pantoprazole  20 mg Oral Daily  . potassium chloride SA  40 mEq Oral BID  . sertraline  50 mg Oral Daily  . simvastatin  5 mg Oral QHS  . sodium chloride  3 mL Intravenous Q12H    Infusions: . furosemide (LASIX) infusion 15 mg/hr (01/20/15 2300)  . milrinone 0.375 mcg/kg/min (01/21/15 0558)    PRN Medications: sodium chloride, acetaminophen, dextromethorphan, ondansetron (ZOFRAN) IV, ondansetron, sodium chloride   Assessment:   1. 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 Diabetes with diabetic retinopathy 3. SInus Tach  4. DNR  5. Hypokalemia  Plan/Discussion:    CO-OX today up to 51%. On Milrinone 0.375 mcg. Brisk diuresis over night. Weight down 12 pounds. Stop IV lasix and transition to torsemide 40 mg twice a day. This is a lower dose of  torsemide but with increased milrinone he may not need as much. No bb/ace with low output and soft BP. Renal function stable.   ECHO EF 20-25% today RV moderately dilated.   O2 sat sleeping-> 84%. Add 2 liters continuous oxygen.   Consult PT  IF unable to diurese will need palliative care.   Home tomorrow with milrinone 0.375 mcg.   Length of Stay: 3   CLEGG,[name]  01/21/2015, 8:14 AM  Advanced Heart Failure Team Pager [phone] (M-F; 7a - 4p)  Please contact [facility] for night-coverage after hours (4p -7a ) and weekends on [email or web address]  Patient seen with NP, agree with the above note.  He is doing well, CVP down to 6-7.  Co-ox not great but considerably higher on increased milrinone.  Creatinine stable.  Will transition to torsemide 80 mg po daily at home on higher milrinone (0.375).  Plan for discharge in am as long as stable.    He will need home oxygen at night, will walk him and see about during the day.   Marca Ancona 01/21/2015 10:16 AM

## 2015-01-21 NOTE — Evaluation (Signed)
Physical Therapy Evaluation Patient Details Name: Gary Gallegos MRN: [id number] DOB: 11-Jul-1945 Today's Date: 01/21/2015   History of Present Illness  69 y.o. male admitted to [facility] on [facility]/18/15 with increased cough, fatigue, and limited ambulation.  He was admitted from the heart failure clinic. Dx with volume overload.  Pt with significant PMHx of CHF, HTN, impaired vision both eyes, AICD, DM, and blind in left eye.  Clinical Impression  Pt is able to ambulate into the hallway with RW and min assist.  He is very deconditioned and weak on his feet.  He will need 24/7 hands on assist to discharge home safely.  He reports his daughter is not currently working and can provide this assist.  His preference is for HHPT over SNF for rehab.  Daughter is not available to confirm that she can handle him physically.  PT will return to assess ability to do stairs tomorrow AM prior to discharge.  PT to follow acutely for deficits listed below.       Follow Up Recommendations Home health PT;Supervision/Assistance - 24 hour    Equipment Recommendations  None recommended by PT    Recommendations for Other Services   NA    Precautions / Restrictions Precautions Precautions: Fall Precaution Comments: recent h/o falls      Mobility             Transfers Overall transfer level: Needs assistance Equipment used: Rolling walker (2 wheeled) Transfers: Sit to/from Stand Sit to Stand: Min assist         General transfer comment: min assist to support trunk during transitions. Verbal cues for safe hand placement.   Ambulation/Gait Ambulation/Gait assistance: Min assist Ambulation Distance (Feet): 65 Feet Assistive device: Rolling walker (2 wheeled) Gait Pattern/deviations: Step-through pattern;Shuffle;Trunk flexed Gait velocity: decreased Gait velocity interpretation: Below normal speed for age/gender General Gait Details: Pt with slow gait over weak and shaking legs.  He has significant activity  intolerance due to weakness.  Max repetitive verbal cues to get closer to RW (possibly vision impairing his ability to do this).  O2 sats in high 90s on RA and HR in the 110s throughout.                      Balance Overall balance assessment: Needs assistance         Standing balance support: Bilateral upper extremity supported Standing balance-Leahy Scale: Poor                               Pertinent Vitals/Pain Pain Assessment: No/denies pain    Home Living Family/patient expects to be discharged to:: Private residence Living Arrangements: Children (daughter who doesn't currently work) Available Help at Discharge: Family;Available 24 hours/day Type of Home: Apartment Home Access: Stairs to enter Entrance Stairs-Rails: Right;Left;Can reach both Entrance Stairs-Number of Steps: 3 Home Layout: One level Home Equipment: Walker - 2 wheels;Walker - 4 wheels;Cane - single point;Shower seat;Bedside commode;Wheelchair - manual (broken 4 wheeled walker)      Prior Function Level of Independence: Independent with assistive device(s)         Comments: uses a walker for gait, ADLs independently "until I got sick"     Hand Dominance   Dominant Hand: Right    Extremity/Trunk Assessment   Upper Extremity Assessment: Generalized weakness           Lower Extremity Assessment: Generalized weakness      Cervical /  Trunk Assessment: Normal  Communication   Communication: No difficulties  Cognition Arousal/Alertness: Awake/alert Behavior During Therapy: WFL for tasks assessed/performed Overall Cognitive Status: No family/caregiver present to determine baseline cognitive functioning                               Assessment/Plan    PT Assessment Patient needs continued PT services  PT Diagnosis Difficulty walking;Abnormality of gait;Generalized weakness   PT Problem List Decreased strength;Decreased activity tolerance;Decreased  balance;Decreased mobility;Decreased knowledge of use of DME;Decreased cognition;Cardiopulmonary status limiting activity  PT Treatment Interventions DME instruction;Gait training;Stair training;Functional mobility training;Therapeutic activities;Therapeutic exercise;Balance training;Neuromuscular re-education;Cognitive remediation;Patient/family education   PT Goals (Current goals can be found in the Care Plan section) Acute Rehab PT Goals Patient Stated Goal: to go home tomorrow with his daughter's assistance.  PT Goal Formulation: With patient Time For Goal Achievement: 02/04/15 Potential to Achieve Goals: Good    Frequency Min 3X/week   Barriers to discharge           End of Session Equipment Utilized During Treatment: Gait belt Activity Tolerance: Patient limited by fatigue Patient left: in chair;with call bell/phone within reach;with chair alarm set           Time: 1610-9604 PT Time Calculation (min) (ACUTE ONLY): 23 min   Charges:   PT Evaluation $Initial PT Evaluation Tier I: 1 Procedure PT Treatments $Gait Training: 8-22 mins        Taresa [place] B. Shahzad Thomann, PT, DPT [phone]   01/21/2015, 3:49 PM

## 2015-01-21 NOTE — Progress Notes (Signed)
SATURATION QUALIFICATIONS: (This note is used to comply with regulatory documentation for home oxygen)  Patient Saturations on Room Air at Rest = 98%  Patient Saturations on Room Air while Ambulating = 98%  Patient Saturations on  Liters of oxygen while Ambulating = %  Please briefly explain why patient needs home oxygen: Pt sats drop at night, while walking on RA sats stayed at 98% the entire time

## 2015-01-21 NOTE — Care Management Important Message (Signed)
Important Message  Patient Details  Name: Gary Gallegos MRN: [id number] Date of Birth: December 25, 1945   Medicare Important Message Given:  Yes-second notification given    Yvonna Alanis 01/21/2015, 11:25 AM

## 2015-01-22 ENCOUNTER — Encounter (HOSPITAL_COMMUNITY): Payer: Self-pay | Admitting: Physician Assistant

## 2015-01-22 DIAGNOSIS — Z66 Do not resuscitate: Secondary | ICD-10-CM

## 2015-01-22 DIAGNOSIS — E876 Hypokalemia: Secondary | ICD-10-CM

## 2015-01-22 LAB — BASIC METABOLIC PANEL
Anion gap: 8 (ref 5–15)
BUN: 23 mg/dL — ABNORMAL HIGH (ref 6–20)
CALCIUM: 8.5 mg/dL — AB (ref 8.9–10.3)
CHLORIDE: 101 mmol/L (ref 101–111)
CO2: 29 mmol/L (ref 22–32)
CREATININE: 0.89 mg/dL (ref 0.61–1.24)
GFR calc Af Amer: >60 mL/min (ref >60)
Glucose, Bld: 162 mg/dL — ABNORMAL HIGH (ref 65–99)
Potassium: 4.4 mmol/L (ref 3.5–5.1)
Sodium: 138 mmol/L (ref 135–145)

## 2015-01-22 LAB — GLUCOSE, CAPILLARY
GLUCOSE-CAPILLARY: 264 mg/dL — AB (ref 65–99)
Glucose-Capillary: 171 mg/dL — ABNORMAL HIGH (ref 65–99)
Glucose-Capillary: 215 mg/dL — ABNORMAL HIGH (ref 65–99)

## 2015-01-22 LAB — CARBOXYHEMOGLOBIN
CARBOXYHEMOGLOBIN: 1.4 % (ref 0.5–1.5)
METHEMOGLOBIN: 0.8 % (ref 0.0–1.5)
O2 SAT: 65.3 %
Total hemoglobin: 12.6 g/dL — ABNORMAL LOW (ref 13.5–18.0)

## 2015-01-22 MED ORDER — CARVEDILOL 3.125 MG PO TABS: 3.1250 mg | ORAL_TABLET | Freq: Two times a day (BID) | ORAL | Status: DC

## 2015-01-22 MED ORDER — MILRINONE IN DEXTROSE 20 MG/100ML IV SOLN: 0.3750 ug/kg/min | INTRAVENOUS | Status: AC

## 2015-01-22 MED ORDER — POTASSIUM CHLORIDE CRYS ER 20 MEQ PO TBCR: 40.0000 meq | EXTENDED_RELEASE_TABLET | Freq: Two times a day (BID) | ORAL | Status: DC

## 2015-01-22 MED ORDER — TORSEMIDE 20 MG PO TABS: 80.0000 mg | ORAL_TABLET | Freq: Every day | ORAL | Status: DC

## 2015-01-22 NOTE — Progress Notes (Signed)
Patient Name: Gary Gallegos Date of Encounter: 01/22/2015  PROBLEM LIST  Active Problems:   Acute on chronic congestive heart failure     SUBJECTIVE  Breathing better. Still feels weak. No chest pain.   CURRENT MEDS . aspirin EC  81 mg Oral Daily  . enoxaparin (LOVENOX) injection  40 mg Subcutaneous Q24H  . insulin aspart  0-15 Units Subcutaneous TID WC  . insulin glargine  5 Units Subcutaneous QHS  . mirtazapine  7.5 mg Oral QHS  . pantoprazole  20 mg Oral Daily  . potassium chloride SA  40 mEq Oral BID  . sertraline  50 mg Oral Daily  . simvastatin  5 mg Oral QHS  . sodium chloride  3 mL Intravenous Q12H  . torsemide  80 mg Oral Daily    OBJECTIVE  Filed Vitals:   01/22/15 0033 01/22/15 0100 01/22/15 0413 01/22/15 0825  BP: 110/75  104/75   Pulse: 127  123   Temp: 98.1 F (36.7 C)  98.2 F (36.8 C) 98.1 F (36.7 C)  TempSrc: Oral  Oral Oral  Resp: 33 18 31   Height:      Weight:   137 lb 11.2 oz (62.46 kg)   SpO2: 96%  96%     Intake/Output Summary (Last 24 hours) at 01/22/15 1001 Last data filed at 01/22/15 0911  Gross per 24 hour  Intake    820 ml  Output   2300 ml  Net  -1480 ml   Filed Weights   01/20/15 0400 01/21/15 0300 01/22/15 0413  Weight: 152 lb 11.2 oz (69.264 kg) 140 lb 14.4 oz (63.912 kg) 137 lb 11.2 oz (62.46 kg)    PHYSICAL EXAM  GEN: Well nourished, well developed, in no acute distress. HEENT: normal. Neck: JVP 7-8 Cardiac: RRR, no murmurs. No edema.     Respiratory:  Decreased breath sounds bilaterally, no rales GI: Soft, nontender, nondistended MS: no deformity or atrophy. Skin: warm and dry, no rash. Psych: Normal affect.  Accessory Clinical Findings  CBC No results for input(s): WBC, NEUTROABS, HGB, HCT, MCV, PLT in the last 72 hours. Basic Metabolic Panel  Recent Labs  01/20/15 1440 01/21/15 0454 01/22/15 0410  NA  --  136 138  K  --  3.4* 4.4  CL  --  96* 101  CO2  --  31 29  GLUCOSE  --  201* 162*  BUN   --  22* 23*  CREATININE  --  1.14 0.89  CALCIUM  --  8.7* 8.5*  MG 1.9 1.9  --    Liver Function Tests No results for input(s): AST, ALT, ALKPHOS, BILITOT, PROT, ALBUMIN in the last 72 hours. No results for input(s): LIPASE, AMYLASE in the last 72 hours. Cardiac Enzymes No results for input(s): CKTOTAL, CKMB, CKMBINDEX, TROPONINI in the last 72 hours. BNP (last 3 results)  Recent Labs  12/29/14 1540 01/18/15 1418  BNP 1290.9* 1896.9*   D-Dimer No results for input(s): DDIMER in the last 72 hours. Hemoglobin A1C No results for input(s): HGBA1C in the last 72 hours. Fasting Lipid Panel No results for input(s): CHOL, HDL, LDLCALC, TRIG, CHOLHDL, LDLDIRECT in the last 72 hours. Thyroid Function Tests No results for input(s): TSH, T4TOTAL, T3FREE, THYROIDAB in the last 72 hours.  Invalid input(s): FREET3  TELE  Sinus tachy    RADIOLOGY/STUDIES  Dg Chest Port 1 View  01/19/2015   CLINICAL DATA:  Dyspnea; acute and chronic CHF, diabetes, cirrhosis.  EXAM: PORTABLE CHEST -  1 VIEW  COMPARISON:  PA and lateral chest of February 08, 2014  FINDINGS: The lungs are adequately inflated. The cardiopericardial silhouette is markedly increased in size. The pulmonary vascularity is only minimally prominent. There is no significant interstitial edema. There is no pleural effusion or pneumothorax. A right internal jugular venous catheter tip projects at the cavoatrial junction. The permanent AICD is in stable position. The bony structures exhibit no acute abnormality.  IMPRESSION: Marked enlargement of the cardiac silhouette since the previous study. Minimal central pulmonary vascular prominence. There is no interstitial or alveolar edema.  An echo cardiogram it would be useful to exclude a pericardial effusion.   Electronically Signed   By: David  [place] M.D.   On: 01/19/2015 07:47    ECHO 01/19/15 - Left ventricle: The cavity size was moderately dilated. Systolic function was severely reduced.  The estimated ejection fraction was in the range of 10-15%. Diffuse hypokinesis. The study is not technically sufficient to allow evaluation of LV diastolic function. - Ventricular septum: Septal motion showed paradox. - Aortic valve: There was trivial regurgitation. - Aortic root: The aortic root was normal in size. - Ascending aorta: The ascending aorta was normal in size. - Mitral valve: Structurally normal valve. There was mild regurgitation directed centrally. - Left atrium: The atrium was mildly dilated. - Right ventricle: The cavity size was moderately dilated. Wall thickness was normal. Systolic function was mildly reduced. - Right atrium: The atrium was normal in size. - Tricuspid valve: There was mild regurgitation. - Pulmonary arteries: Systolic pressure was mildly increased. PA peak pressure: 40 mm Hg (S). - Inferior vena cava: The vessel was dilated. The respirophasic diameter changes were blunted (< 50%), consistent with elevated central venous pressure. - Pericardium, extracardiac: A mild circumferential pericardial effusion was identified posterior to the heart. Features were not consistent with tamponade physiology.   PATIENT SUMMARY  69 yo male with chronic biventricular HF in the setting of NICM with EF 10%, mod to severe MR, home Milrinone Rx, s/p ICD, diabetic retinopathy, HTN, DM2.  He was admitted 01/18/2015 with marked volume overload despite attempts at diuresing with IV Lasix as an outpatient.  CO-OX on admit 32%.  He demonstrated cardiogenic shock physiology despite Milrinone support.  Milrinone dose was titrated to 0.375 mcg.  He was diuresed on Lasix gtt and this was augmented with Metolazone.  His CO-OX improved to 51% yesterday and CVP was 6.  Lasix was transitioned to Torsemide 80 mg  QD yesterday.  O2 with ambulation on RA yesterday was ok (rest 98% >> ambulation 98%).  HHPT has been recommended.  CO-OX today 65.3.     ASSESSMENT  1.  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 Diabetes with diabetic retinopathy 3. SInus Tach  4. DNR  5. Hypokalemia  PLAN  CO-OX today up to 65.3%.  He is net -12 Liters this admit.  Wt 152 >> 137 lbs.  O2 with ambulation ok.  He will just need O2 with sleep.  K+ today ok.  Likely ready for DC today.  Attending Cardiologist to see patient later this AM.    Signed, Tereso Newcomer, PA-C  01/22/2015, 10:01 AM     Patient seen with PA, agree with the above note.    I think he can go home today.  Doing well, good co-ox and CVP 5.    Sinus tachy, think he can restart low dose Coreg 3.125 mg bid.   Needs followup  in CHF clinic within 2 wks, needs home oxygen at night.  Meds: Milrinone 0.375 mcg/kg/min, torsemide 80 mg daily, KCl 40 bid, Zocor 5 qhs, Coreg 3.125 mg bid.   Marca Ancona 01/22/2015 12:20 PM

## 2015-01-22 NOTE — Progress Notes (Signed)
Physical Therapy Treatment Patient Details Name: Gary Gallegos MRN: [id number] DOB: 1945/10/26 Today's Date: 01/22/2015    History of Present Illness 69 y.o. male admitted to [facility] on 01/18/15 with increased cough, fatigue, and limited ambulation.  He was admitted from the heart failure clinic. Dx with volume overload.  Pt with significant PMHx of CHF, HTN, impaired vision both eyes, AICD, DM, and blind in left eye.    PT Comments    Pt is progressing well with his mobility today and was able to walk much further than yesterday and demonstrate the ability to do steps with support of rails.  His HR max observed during the session was 135 (RN aware), and O2 sats in the high 90s with no signs of DOE.  PT will continue to follow acutely to progress pt's mobility, strength, endurance, and activity tolerance so he can return home safely with daughter's assist at discharge.   Follow Up Recommendations  Home health PT;Supervision/Assistance - 24 hour     Equipment Recommendations  None recommended by PT    Recommendations for Other Services   NA     Precautions / Restrictions Precautions Precautions: Fall Precaution Comments: recent h/o falls, and weak on his feet.     Mobility  Bed Mobility Overal bed mobility: Modified Independent             General bed mobility comments: pt using bed rail for leverage and HOB mildly elevated  Transfers Overall transfer level: Needs assistance Equipment used: Rolling walker (2 wheeled) Transfers: Stand Pivot Transfers;Sit to/from Stand Sit to Stand: Min assist Stand pivot transfers: Min guard       General transfer comment: Min assist to stand from bed and min guard assist to transfer to chair without RW. Heavy reliance on hands for transitions.   Ambulation/Gait Ambulation/Gait assistance: Min assist Ambulation Distance (Feet): 150 Feet Assistive device: Rolling walker (2 wheeled) Gait Pattern/deviations: Step-through  pattern;Shuffle;Trunk flexed Gait velocity: decreased Gait velocity interpretation: Below normal speed for age/gender General Gait Details: verbal cues to stay closer to RW, step length significantly better.    Stairs Stairs: Yes Stairs assistance: Supervision Stair Management: Two rails;Step to pattern;Forwards Number of Stairs: 8 General stair comments: had to step up and down one step multiple times due to lines preventing [place] from going up further. Pt did fatigue after several repetitions.          Balance Overall balance assessment: Needs assistance Sitting-balance support: Feet supported;Bilateral upper extremity supported Sitting balance-Leahy Scale: Fair     Standing balance support: Bilateral upper extremity supported Standing balance-Leahy Scale: Poor                      Cognition Arousal/Alertness: Awake/alert Behavior During Therapy: WFL for tasks assessed/performed Overall Cognitive Status: No family/caregiver present to determine baseline cognitive functioning                             Pertinent Vitals/Pain Pain Assessment: No/denies pain           PT Goals (current goals can now be found in the care plan section) Acute Rehab PT Goals Patient Stated Goal: to go home tomorrow with his daughter's assistance.  Progress towards PT goals: Progressing toward goals    Frequency  Min 3X/week    PT Plan Current plan remains appropriate       End of Session Equipment Utilized During Treatment: Gait belt Activity Tolerance:  Patient limited by fatigue;Treatment limited secondary to medical complications (Comment) (high HR) Patient left: in chair;with call bell/phone within reach;with chair alarm set     Time: 5038-8828 PT Time Calculation (min) (ACUTE ONLY): 32 min  Charges:  $Gait Training: 23-37 mins                      Rebecca B. Medendorp, PT, DPT [phone]   01/22/2015, 10:28 AM

## 2015-01-22 NOTE — Discharge Summary (Signed)
Discharge Summary   Patient ID: Gary Gallegos, MRN: [id number], DOB/AGE: Oct 24, 1945 69 y.o.  Admit date: 01/18/2015 Discharge date: 01/22/2015   Primary Care Physician:  Kennyth Arnold   Primary Cardiologist:  Dr. Ena Dawley  CHF:  Dr. Glori Bickers   Reason for Admission:  Acute on Chronic Biventricular HF   Primary Discharge Diagnoses:  Principal Problem:   Acute on chronic Biventricular congestive heart failure Active Problems:   Diabetes mellitus   Sinus tachycardia   DNR (do not resuscitate)   Hypokalemia     Wt Readings from Last 3 Encounters:  01/22/15 137 lb 11.2 oz (62.46 kg)  01/18/15 159 lb 6 oz (72.292 kg)  01/10/15 155 lb (70.308 kg)    Secondary Discharge Diagnoses:   Past Medical History  Diagnosis Date  . Biventricular CHF (congestive heart failure)     a. Echo 8/16:  EF 10-15%, diff HK, trivial AI, mild MR, mild LAE, mod RVE, mild reduced RVF, mild TR, PASP 40 mmHg, mild eff without tamponade.   . Hypertension   . Impaired vision in both eyes   . AICD (automatic cardioverter/defibrillator) present   . Hyperlipidemia   . Type II diabetes mellitus   . Depression   . GERD (gastroesophageal reflux disease)   . Blind left eye   . NICM (nonischemic cardiomyopathy)       Allergies:    Allergies  Allergen Reactions  . Penicillin G Hives  . Fluorometholone Rash    Eye drop -> rash  . Penicillins Nausea And Vomiting  . Sulfa Antibiotics Nausea And Vomiting  . Sulfamethoxazole-Trimethoprim Nausea Only      Procedures Performed This Admission:   None   Hospital Course:  Eriberto Gallegos is a 69 y.o. male with a hx of chronic biventricular HF in the setting of NICM with EF 10%, mod to severe MR, home Milrinone Rx, s/p ICD, diabetic retinopathy, HTN, DM2. He is followed closely in the [facility].  He was admitted 01/18/2015 with marked volume overload despite attempts at diuresing with IV Lasix as an outpatient. CO-OX on admit 32%.  He demonstrated cardiogenic shock physiology despite Milrinone support. Milrinone dose was titrated to 0.375 mcg. He was diuresed on Lasix gtt and this was augmented with Metolazone. His CO-OX improved to 51% yesterday and CVP was 6. Lasix was transitioned to Torsemide 80 mg QD yesterday. O2 with ambulation on RA yesterday was ok (rest 98% >> ambulation 98%). He did have desaturations at night and will need Home O2 at night only.  HHPT has been recommended. CO-OX today 65.3. CVP 5.  He has remained in sinus tachycardia.  He is unable to tolerated beta-blocker or ACE inhibitor due to soft BP and low output.  He was evaluated today by Dr. Loralie Champagne and felt to be stable for DC to home.  Coreg will be resumed at 3.125 mg Twice daily.   Discharge Vitals:   Blood pressure 104/75, pulse 135, temperature 98.4 F (36.9 C), temperature source Oral, resp. rate 31, height 5' 8"  (1.727 m), weight 137 lb 11.2 oz (62.46 kg), SpO2 98 %.   Labs:   Recent Labs  01/20/15 0430 01/21/15 0454 01/22/15 0410  NA 136 136 138  K 3.6 3.4* 4.4  CL 108 96* 101  CO2 22 31 29   BUN 27* 22* 23*  CREATININE 1.17 1.14 0.89  CALCIUM 8.3* 8.7* 8.5*    Lab Results  Component Value Date   TSH 3.501 01/18/2015  Diagnostic Procedures and Studies:  Dg Chest Port 1 View  01/19/2015    IMPRESSION: Marked enlargement of the cardiac silhouette since the previous study. Minimal central pulmonary vascular prominence. There is no interstitial or alveolar edema.  An echo cardiogram it would be useful to exclude a pericardial effusion.   Electronically Signed   By: David  Martinique M.D.   On: 01/19/2015 07:47    2D Echocardiogram 01/19/15 - Left ventricle: The cavity size was moderately dilated. Systolicfunction was severely reduced. The estimated ejection fraction was in the range of 10-15%. Diffuse hypokinesis. The study is nottechnically sufficient to allow evaluation of LV diastolicfunction. - Ventricular  septum: Septal motion showed paradox. - Aortic valve: There was trivial regurgitation. - Aortic root: The aortic root was normal in size. - Ascending aorta: The ascending aorta was normal in size. - Mitral valve: Structurally normal valve. There was mildregurgitation directed centrally. - Left atrium: The atrium was mildly dilated. - Right ventricle: The cavity size was moderately dilated. Wallthickness was normal. Systolic function was mildly reduced. - Right atrium: The atrium was normal in size. - Tricuspid valve: There was mild regurgitation. - Pulmonary arteries: Systolic pressure was mildly increased. PApeak pressure: 40 mm Hg (S). - Inferior vena cava: The vessel was dilated. The respirophasicdiameter changes were blunted (< 50%), consistent with elevated central venous pressure. - Pericardium, extracardiac: A mild circumferential pericardialeffusion was identified posterior to the heart. Features were not consistent with tamponade physiology.   Disposition:   Pt is being discharged home today in fair condition.   Follow-up Plans & Appointments      Follow-up Information    Follow up with CLEGG,AMY, NP On 02/08/2015.   Specialty:  Nurse Practitioner   Why:  at 10:20 am for hospital follow up with the HF clinic.  Please bring all of your medications to your visit.  [facility] [phone] [facility]   Contact information:   [address] [place] [address] [phone]       Follow up with [facility].   Why:  resume [place]- home IV Milrinone (HF management)   Contact information:   [address] [place] [address] [phone]       Follow up with [facility].   Why:  home 02 being arranged for night time use   Contact information:   [address] [place] [address] [phone]       Discharge Medications    Medication List    STOP taking these medications          ACCU-CHEK NANO SMARTVIEW W/DEVICE Kit     glucose blood test strip  Commonly known as:  ACCU-CHEK SMARTVIEW      TAKE these medications        aspirin EC 81 MG tablet  Take 81 mg by mouth daily.     carvedilol 3.125 MG tablet  Commonly known as:  COREG  TAKE 1 TABLET BY MOUTH TWO TIMES DAILY WITH A MEAL     insulin glargine 100 UNIT/ML injection  Commonly known as:  LANTUS  Inject 5 Units into the skin at bedtime.     milrinone 20 MG/100ML Soln infusion  Commonly known as:  PRIMACOR  Inject 25.875 mcg/min into the vein continuous.     mirtazapine 7.5 MG tablet  Commonly known as:  REMERON  take 1 tablet by mouth at bedtime  pantoprazole 20 MG tablet  Commonly known as:  PROTONIX  take 1 tablet by mouth once daily     potassium chloride SA 20 MEQ tablet  Commonly known as:  K-DUR,KLOR-CON  Take 2 tablets (40 mEq total) by mouth 2 (two) times daily.     prednisoLONE acetate 1 % ophthalmic suspension  Commonly known as:  PRED FORTE  Place 1 drop into the left eye daily.     sertraline 50 MG tablet  Commonly known as:  ZOLOFT  Take 50 mg by mouth daily.     simvastatin 5 MG tablet  Commonly known as:  ZOCOR  Take 5 mg by mouth at bedtime.     torsemide 20 MG tablet  Commonly known as:  DEMADEX  Take 4 tablets (80 mg total) by mouth daily.         Outstanding Labs/Studies  1. BMET at FU office visit should be arranged.   Duration of Discharge Encounter: Greater than 30 minutes including physician and PA time.  Signed, Richardson Dopp, PA-C   01/22/2015 12:31 PM

## 2015-01-22 NOTE — Discharge Instructions (Signed)
Heart Failure °Heart failure means your heart has trouble pumping blood. This makes it hard for your body to work well. Heart failure is usually a long-term (chronic) condition. You must take good care of yourself and follow your doctor's treatment plan. °HOME CARE °· Take your heart medicine as told by your doctor. °· Do not stop taking medicine unless your doctor tells you to. °· Do not skip any dose of medicine. °· Refill your medicines before they run out. °· Take other medicines only as told by your doctor or pharmacist. °· Stay active if told by your doctor. The elderly and people with severe heart failure should talk with a doctor about physical activity. °· Eat heart-healthy foods. Choose foods that are without trans fat and are low in saturated fat, cholesterol, and salt (sodium). This includes fresh or frozen fruits and vegetables, fish, lean meats, fat-free or low-fat dairy foods, whole grains, and high-fiber foods. Lentils and dried peas and beans (legumes) are also good choices. °· Limit salt if told by your doctor. °· Cook in a healthy way. Roast, grill, broil, bake, poach, steam, or stir-fry foods. °· Limit fluids as told by your doctor. °· Weigh yourself every morning. Do this after you pee (urinate) and before you eat breakfast. Write down your weight to give to your doctor. °· Take your blood pressure and write it down if your doctor tells you to. °· Ask your doctor how to check your pulse. Check your pulse as told. °· Lose weight if told by your doctor. °· Stop smoking or chewing tobacco. Do not use gum or patches that help you quit without your doctor's approval. °· Schedule and go to doctor visits as told. °· Nonpregnant women should have no more than 1 drink a day. Men should have no more than 2 drinks a day. Talk to your doctor about drinking alcohol. °· Stop illegal drug use. °· Stay current with shots (immunizations). °· Manage your health conditions as told by your doctor. °· Learn to  manage your stress. °· Rest when you are tired. °· If it is really hot outside: °· Avoid intense activities. °· Use air conditioning or fans, or get in a cooler place. °· Avoid caffeine and alcohol. °· Wear loose-fitting, lightweight, and light-colored clothing. °· If it is really cold outside: °· Avoid intense activities. °· Layer your clothing. °· Wear mittens or gloves, a hat, and a scarf when going outside. °· Avoid alcohol. °· Learn about heart failure and get support as needed. °· Get help to maintain or improve your quality of life and your ability to care for yourself as needed. °GET HELP IF:  °· You gain 03 lb/1.4 kg or more in 1 day or 05 lb/2.3 kg in a week. °· You are more short of breath than usual. °· You cannot do your normal activities. °· You tire easily. °· You cough more than normal, especially with activity. °· You have any or more puffiness (swelling) in areas such as your hands, feet, ankles, or belly (abdomen). °· You cannot sleep because it is hard to breathe. °· You feel like your heart is beating fast (palpitations). °· You get dizzy or light-headed when you stand up. °GET HELP RIGHT AWAY IF:  °· You have trouble breathing. °· There is a change in mental status, such as becoming less alert or not being able to focus. °· You have chest pain or discomfort. °· You faint. °MAKE SURE YOU:  °· Understand these instructions. °·   Will watch your condition. °· Will get help right away if you are not doing well or get worse. °Document Released: 03/13/2008 Document Revised: 10/19/2013 Document Reviewed: 07/21/2012 °ExitCare® Patient Information ©2015 [employer]. This information is not intended to replace advice given to you by your health care provider. Make sure you discuss any questions you have with your health care provider. ° °Low-Sodium Eating Plan °Sodium raises blood pressure and causes water to be held in the body. Getting less sodium from food will help lower your blood pressure, reduce  any swelling, and protect your heart, liver, and kidneys. We get sodium by adding salt (sodium chloride) to food. Most of our sodium comes from canned, boxed, and frozen foods. Restaurant foods, fast foods, and pizza are also very high in sodium. Even if you take medicine to lower your blood pressure or to reduce fluid in your body, getting less sodium from your food is important. °WHAT IS MY PLAN? °Most people should limit their sodium intake to 2,300 mg a day. Your health care provider recommends that you limit your sodium intake to __________ a day.  °WHAT DO I NEED TO KNOW ABOUT THIS EATING PLAN? °For the low-sodium eating plan, you will follow these general guidelines: °· Choose foods with a % Daily Value for sodium of less than 5% (as listed on the food label).   °· Use salt-free seasonings or herbs instead of table salt or sea salt.   °· Check with your health care provider or pharmacist before using salt substitutes.   °· Eat fresh foods. °· Eat more vegetables and fruits. °· Limit canned vegetables. If you do use them, rinse them well to decrease the sodium.   °· Limit cheese to 1 oz (28 g) per day.    °· Eat lower-sodium products, often labeled as "lower sodium" or "no salt added." °· Avoid foods that contain monosodium glutamate (MSG). MSG is sometimes added to Chinese food and some canned foods.   °· Check food labels (Nutrition Facts labels) on foods to learn how much sodium is in one serving. °· Eat more home-cooked food and less restaurant, buffet, and fast food.  °· When eating at a restaurant, ask that your food be prepared with less salt or none, if possible.   °HOW DO I READ FOOD LABELS FOR SODIUM INFORMATION? °The Nutrition Facts label lists the amount of sodium in one serving of the food. If you eat more than one serving, you must multiply the listed amount of sodium by the number of servings. °Food labels may also identify foods as: °· Sodium free--Less than 5 mg in a serving. °· Very low  sodium--35 mg or less in a serving. °· Low sodium--140 mg or less in a serving. °· Light in sodium--50% less sodium in a serving. For example, if a food that usually has 300 mg of sodium is changed to become light in sodium, it will have 150 mg of sodium. °· Reduced sodium--25% less sodium in a serving. For example, if a food that usually has 400 mg of sodium is changed to reduced sodium, it will have 300 mg of sodium. °WHAT FOODS CAN I EAT? °Grains  °Low-sodium cereals, including oats, puffed wheat and rice, and shredded wheat cereals. Low-sodium crackers. Unsalted rice and pasta. Lower-sodium bread.  °Vegetables  °Frozen or fresh vegetables. Low-sodium or reduced-sodium canned vegetables. Low-sodium or reduced-sodium tomato sauce and paste. Low-sodium or reduced-sodium tomato and vegetable juices.  °Fruits  °Fresh, frozen, and canned fruit. Fruit juice.  °Meat and Other Protein Products  °Low-sodium canned tuna and salmon.   Fresh or frozen meat, poultry, seafood, and fish. Lamb. Unsalted nuts. Dried beans, peas, and lentils without added salt. Unsalted canned beans. Homemade soups without salt. Eggs.  °Dairy  °Milk. Soy milk. Ricotta cheese. Low-sodium or reduced-sodium cheeses. Yogurt.  °Condiments  °Fresh and dried herbs and spices. Salt-free seasonings. Onion and garlic powders. Low-sodium varieties of mustard and ketchup. Lemon juice.  °Fats and Oils   °Reduced-sodium salad dressings. Unsalted butter.   °Other  °Unsalted popcorn and pretzels.  °The items listed above may not be a complete list of recommended foods or beverages. Contact your dietitian for more options. °WHAT FOODS ARE NOT RECOMMENDED? °Grains  °Instant hot cereals. Bread stuffing, pancake, and biscuit mixes. Croutons. Seasoned rice or pasta mixes. Noodle soup cups. Boxed or frozen macaroni and cheese. Self-rising flour. Regular salted crackers. °Vegetables  °Regular canned vegetables. Regular canned tomato sauce and paste. Regular tomato and  vegetable juices. Frozen vegetables in sauces. Salted french fries. Olives. Pickles. Relishes. Sauerkraut. Salsa. °Meat and Other Protein Products  °Salted, canned, smoked, spiced, or pickled meats, seafood, or fish. Bacon, ham, sausage, hot dogs, corned beef, chipped beef, and packaged luncheon meats. Salt pork. Jerky. Pickled herring. Anchovies, regular canned tuna, and sardines. Salted nuts. °Dairy  °Processed cheese and cheese spreads. Cheese curds. Blue cheese and cottage cheese. Buttermilk.  °Condiments  °Onion and garlic salt, seasoned salt, table salt, and sea salt. Canned and packaged gravies. Worcestershire sauce. Tartar sauce. Barbecue sauce. Teriyaki sauce. Soy sauce, including reduced sodium. Steak sauce. Fish sauce. Oyster sauce. Cocktail sauce. Horseradish. Regular ketchup and mustard. Meat flavorings and tenderizers. Bouillon cubes. Hot sauce. Tabasco sauce. Marinades. Taco seasonings. Relishes. °Fats and Oils   °Regular salad dressings. Salted butter. Margarine. Ghee. Bacon fat.  °Other  °Potato and tortilla chips. Corn chips and puffs. Salted popcorn and pretzels. Canned or dried soups. Pizza. Frozen entrees and pot pies.   °The items listed above may not be a complete list of foods and beverages to avoid. Contact your dietitian for more information. °Document Released: 11/24/2001 Document Revised: 06/09/2013 Document Reviewed: 04/08/2013 °ExitCare® Patient Information ©2015 [employer]. This information is not intended to replace advice given to you by your health care provider. Make sure you discuss any questions you have with your health care provider. ° ° °

## 2015-01-25 ENCOUNTER — Encounter: Payer: Self-pay | Admitting: Internal Medicine

## 2015-01-26 ENCOUNTER — Other Ambulatory Visit: Payer: Self-pay | Admitting: Family

## 2015-01-28 ENCOUNTER — Telehealth (HOSPITAL_COMMUNITY): Payer: Self-pay | Admitting: *Deleted

## 2015-01-28 ENCOUNTER — Other Ambulatory Visit: Payer: Self-pay | Admitting: *Deleted

## 2015-01-28 MED ORDER — INSULIN GLARGINE 100 UNIT/ML ~~LOC~~ SOLN: 5.0000 [IU] | Freq: Every day | SUBCUTANEOUS | Status: DC

## 2015-01-28 MED ORDER — MIRTAZAPINE 7.5 MG PO TABS: 7.5000 mg | ORAL_TABLET | Freq: Every day | ORAL | Status: DC

## 2015-01-28 MED ORDER — POTASSIUM CHLORIDE CRYS ER 20 MEQ PO TBCR: 10.0000 meq | EXTENDED_RELEASE_TABLET | Freq: Two times a day (BID) | ORAL | Status: DC

## 2015-01-28 NOTE — Telephone Encounter (Signed)
Rx sent to pharmacy   

## 2015-01-28 NOTE — Telephone Encounter (Signed)
Received labs from [facility] drawn 8/10 K 5.0 Bun 22 CR 0.88 Mag 2.1  Per Otilio Saber, PA decrease KCL from 40 meq bid to 20 meq meq bid.    Called and spoke w/pt's daughter/caregiver Faith, she states pt has only been taking KCL 20 meq Twice daily, advised to decrease to 10 meq Twice daily, she is aware and agreeable, he is getting weekly labs to continue monitor.

## 2015-02-04 ENCOUNTER — Other Ambulatory Visit (HOSPITAL_COMMUNITY): Payer: Self-pay | Admitting: Internal Medicine

## 2015-02-08 ENCOUNTER — Ambulatory Visit (HOSPITAL_COMMUNITY)
Admit: 2015-02-08 | Discharge: 2015-02-08 | Disposition: A | Discharge status: Home / Self Care | Payer: Medicare Other | Source: Ambulatory Visit | Attending: Cardiology | Admitting: Cardiology

## 2015-02-08 ENCOUNTER — Other Ambulatory Visit (HOSPITAL_COMMUNITY): Payer: Self-pay | Admitting: *Deleted

## 2015-02-08 VITALS — BP 104/66 | HR 92 | Wt 145.8 lb

## 2015-02-08 DIAGNOSIS — Z79899 Other long term (current) drug therapy: Secondary | ICD-10-CM | POA: Insufficient documentation

## 2015-02-08 DIAGNOSIS — I10 Essential (primary) hypertension: Secondary | ICD-10-CM | POA: Insufficient documentation

## 2015-02-08 DIAGNOSIS — Z95810 Presence of automatic (implantable) cardiac defibrillator: Secondary | ICD-10-CM | POA: Diagnosis not present

## 2015-02-08 DIAGNOSIS — Z7982 Long term (current) use of aspirin: Secondary | ICD-10-CM | POA: Diagnosis not present

## 2015-02-08 DIAGNOSIS — E11319 Type 2 diabetes mellitus with unspecified diabetic retinopathy without macular edema: Secondary | ICD-10-CM | POA: Diagnosis not present

## 2015-02-08 DIAGNOSIS — I471 Supraventricular tachycardia: Secondary | ICD-10-CM | POA: Diagnosis not present

## 2015-02-08 DIAGNOSIS — I428 Other cardiomyopathies: Secondary | ICD-10-CM | POA: Insufficient documentation

## 2015-02-08 DIAGNOSIS — I5022 Chronic systolic (congestive) heart failure: Secondary | ICD-10-CM

## 2015-02-08 DIAGNOSIS — Z66 Do not resuscitate: Secondary | ICD-10-CM | POA: Insufficient documentation

## 2015-02-08 DIAGNOSIS — Z794 Long term (current) use of insulin: Secondary | ICD-10-CM | POA: Diagnosis not present

## 2015-02-08 DIAGNOSIS — H548 Legal blindness, as defined in USA: Secondary | ICD-10-CM | POA: Diagnosis not present

## 2015-02-08 DIAGNOSIS — K219 Gastro-esophageal reflux disease without esophagitis: Secondary | ICD-10-CM | POA: Insufficient documentation

## 2015-02-08 DIAGNOSIS — R000 Tachycardia, unspecified: Secondary | ICD-10-CM | POA: Diagnosis not present

## 2015-02-08 DIAGNOSIS — I509 Heart failure, unspecified: Secondary | ICD-10-CM | POA: Diagnosis present

## 2015-02-08 DIAGNOSIS — E785 Hyperlipidemia, unspecified: Secondary | ICD-10-CM | POA: Insufficient documentation

## 2015-02-08 DIAGNOSIS — I5023 Acute on chronic systolic (congestive) heart failure: Secondary | ICD-10-CM | POA: Diagnosis not present

## 2015-02-08 DIAGNOSIS — H269 Unspecified cataract: Secondary | ICD-10-CM | POA: Diagnosis not present

## 2015-02-08 NOTE — Addendum Note (Signed)
Encounter addended by: Modesta Messing, CMA on: 02/08/2015  1:56 PM<BR>     Documentation filed: Patient Instructions Section

## 2015-02-08 NOTE — Patient Instructions (Addendum)
1 week follow up  Continue your current medications.

## 2015-02-08 NOTE — Progress Notes (Signed)
Advanced Heart Failure Medication Review by a Pharmacist  Does the patient  feel that his/her medications are working for him/her?  yes  Has the patient been experiencing any side effects to the medications prescribed?  no  Does the patient measure his/her own blood pressure or blood glucose at home?  yes   Does the patient have any problems obtaining medications due to transportation or finances?   no  Understanding of regimen: fair Understanding of indications: fair Potential of compliance: fair    Pharmacist comments: Gary Gallegos is a pleasant 69 yo M presenting with his daughter who manages his medications. She states that he takes all of his medications when she is able to check behind him. He did not take his medications this morning. She did state that he has been itching all over his body since his milrinone was increased which is not a common side effect of the medication, but it does not seem to be intolerable. No other causes of itching identified. If itching worsens or does not improve, they were told to call the clinic. No other medication-related issues or concerns were brought up today.   Tyler Deis. Bonnye Fava, PharmD, BCPS, CPP Clinical Pharmacist Pager: [phone] Phone: [phone] 02/08/2015 11:03 AM

## 2015-02-08 NOTE — Progress Notes (Signed)
Patient ID: Gary Gallegos, male   DOB: 04-14-1946, 69 y.o.   MRN: [id number]  PCP: Dr. Worthy Rancher, FNP Primary Cardiologist: Dr. Tobias Alexander  HPI: [name] is a 69 yo male referred to the HF clinic by Dr.Nelson. He has a history of DM2 with severe diabetic retinopathy legally blind September , cataracts HTN, HLD, chronic systolic HF due to severe, EF 40% (11/14) s/p ICD Medtronic.   Started having problems with his heart in early 2013. Was followed by cardiologist at [facility]. Had cath in 2013 and arteries were "fine".  Admitted to [facility] in January with N/V/D. EGD  Showed severe gastritis and esophagitis. Started on Milrinone 0.25 mcg via PICC. Discharge weight 133 pounds. Followed by [facility].   Admitted 8/2 through 8/6 with volume overload. Diuresed with IV lasix and milrinone was increased to 0.375 mcg. Transitioned to torsemide 80 mg daily. Discharge weight was 137 pounds.   He presents today for post hospital follow up. He remains on milrinone 0.375 mcg. Overall feeling ok. Weight at home 135-138 pounds. Remains SOB with exertion. Denies PND/Orthopnea. No leg edema. Appetite fair. Taking all medications. Has [facility] following for home milrinone.   ECHO 05/05/13: EF 10%, diff HK, grade II DD, mild MR, LA mod dilated, RV mildly dilated and sys fx mod reduced, mod TR ECHO 11/04/13  EF 10% mod-severe MR RV mod reduced. TAPSE 1.6  ECHO 01/2015 EF 10-15%   Labs 07/15/13 K 5.7 Creatinine 1.95 instructed to cut lisinopril 5 mg daily.  Labs 07/22/13 K 3.6 Creatinine 0.81 Dig 1.9 digoxin stopped Labs  08/17/13 K 3.7 Creatinine 0.8 Glucose 190           08/20/13: K+ 3.7, Creatinine 0.70, Glucose 123, Na 137           10/22/13: K 3.9, creatinine 0.69, glucose 179           02/11/14: K 4.3 cr 1.2           07/05/2014: K 5.4 Creatinine 0.96 potassium cut back to 20 meq once a day.            07/12/2014: K 4.4 Creatinine 0.95             11/09/2014: K 3.6 Creatinine 1.04             6/16: K 3.9,  creatinine 1.01, hgb 11.9             12/29/14: K 3.9 Creatinine 1.19              01/22/2015: K 4.4 Creatinine 0.89   SH: Lives with his daughter, son in law, and 2 granddaughters.     ROS: All systems negative except as listed in HPI, PMH and Problem List.  Past Medical History  Diagnosis Date  . Biventricular CHF (congestive heart failure)     a. Echo 8/16:  EF 10-15%, diff HK, trivial AI, mild MR, mild LAE, mod RVE, mild reduced RVF, mild TR, PASP 40 mmHg, mild eff without tamponade.   . Hypertension   . Impaired vision in both eyes   . AICD (automatic cardioverter/defibrillator) present   . Hyperlipidemia   . Type II diabetes mellitus   . Depression   . GERD (gastroesophageal reflux disease)   . Blind left eye   . NICM (nonischemic cardiomyopathy)     Current Outpatient Prescriptions  Medication Sig Dispense Refill  . aspirin EC 81 MG tablet Take 81 mg by mouth daily.    Marland Kitchen  carvedilol (COREG) 3.125 MG tablet TAKE 1 TABLET BY MOUTH TWO TIMES DAILY WITH A MEAL 60 tablet 1  . insulin glargine (LANTUS) 100 UNIT/ML injection Inject 0.05 mLs (5 Units total) into the skin at bedtime. 10 mL 0  . Melatonin 10 MG TABS Take 10 mg by mouth at bedtime.    . milrinone (PRIMACOR) 20 MG/100ML SOLN infusion Inject 25.875 mcg/min into the vein continuous. 100 mL 999  . pantoprazole (PROTONIX) 20 MG tablet take 1 tablet by mouth once daily 30 tablet 5  . potassium chloride SA (K-DUR,KLOR-CON) 20 MEQ tablet Take 0.5 tablets (10 mEq total) by mouth 2 (two) times daily. 120 tablet 3  . sertraline (ZOLOFT) 50 MG tablet Take 50 mg by mouth daily.    . simvastatin (ZOCOR) 5 MG tablet Take 5 mg by mouth at bedtime.    . torsemide (DEMADEX) 20 MG tablet Take 4 tablets (80 mg total) by mouth daily. 120 tablet 3   No current facility-administered medications for this encounter.    Filed Vitals:   02/08/15 1059  BP: 104/66  Pulse: 92  Weight: 145 lb 12.8 oz (66.134 kg)  SpO2: 99%    PHYSICAL  EXAM: General: Chronically ill appearing. SOB walking in the clinic. Daughter present  HEENT: normal; arcus senilus.  Neck: supple. JVP to ear . Carotids 2+ bilaterally; no bruits. No lymphadenopathy or thryomegaly appreciated. Cor: PMI laterally displaced.Tachy regular. No rubs. + S3; +3/6 systolic murmur   Lungs: clear Abdomen:  nontender, ++distended. No hepatosplenomegaly. No bruits or masses. Good bowel sounds. Extremities: no cyanosis, clubbing, rash; RUE picc; 2+ edema to knees. Neuro: alert & orientedx3, cranial nerves grossly intact. Moves all 4 extremities w/o difficulty. Affect pleasant.  ASSESSMENT & PLAN:  1) Acute/Chronic biventricular HF: inotrope dependent.  NICM s/p ICD. EF 10% mod-severe MR RV mod reduced. He is on chronic home milrinone. NYHA class IIIb. Overall he is doing fair. Suspect he has < 6 months and he and his daughter are aware. Continue current plan.  Volume status elevated but has not had torsemide today. Continue current dose of torsemide.  - Continue current milrinone. - Continue coreg 3.125 mg BID for now. May need to stop.  - No ACEI with soft bp.  - Reinforced the need and importance of daily weights, a low sodium diet, and fluid restriction (less than 2 L a day). Instructed to call the HF clinic if weight increases more than 3 lbs overnight or 5 lbs in a week. 2)  Diabetes with diabetic retinopathy-   3) Sinus Tachycardia- Not candidate for ivabradine due to shock/milrinone.  3) HTN- Stable will continue current medications.  4) R thumb Abscess- resolved.  5) DNR  Follow up in 1 week to reassess volume status. Next visit may need to stop carvedilol. Needs Hospice but with milrinone Hosp[facility] not willing to follow.      CLEGG,[name]  1:39 PM

## 2015-02-09 ENCOUNTER — Encounter: Payer: Self-pay | Admitting: Internal Medicine

## 2015-02-15 ENCOUNTER — Encounter: Payer: Self-pay | Admitting: Family

## 2015-02-15 ENCOUNTER — Encounter (HOSPITAL_COMMUNITY): Payer: Self-pay

## 2015-02-15 ENCOUNTER — Ambulatory Visit (HOSPITAL_COMMUNITY)
Admission: RE | Admit: 2015-02-15 | Discharge: 2015-02-15 | Disposition: A | Discharge status: Home / Self Care | Payer: Medicare Other | Source: Ambulatory Visit | Attending: Internal Medicine | Admitting: Internal Medicine

## 2015-02-15 VITALS — BP 98/72 | HR 104 | Wt 155.0 lb

## 2015-02-15 DIAGNOSIS — Z794 Long term (current) use of insulin: Secondary | ICD-10-CM | POA: Diagnosis not present

## 2015-02-15 DIAGNOSIS — I509 Heart failure, unspecified: Secondary | ICD-10-CM | POA: Insufficient documentation

## 2015-02-15 DIAGNOSIS — H5442 Blindness, left eye, normal vision right eye: Secondary | ICD-10-CM | POA: Insufficient documentation

## 2015-02-15 DIAGNOSIS — I10 Essential (primary) hypertension: Secondary | ICD-10-CM | POA: Insufficient documentation

## 2015-02-15 DIAGNOSIS — E785 Hyperlipidemia, unspecified: Secondary | ICD-10-CM | POA: Diagnosis not present

## 2015-02-15 DIAGNOSIS — Z95810 Presence of automatic (implantable) cardiac defibrillator: Secondary | ICD-10-CM | POA: Insufficient documentation

## 2015-02-15 DIAGNOSIS — Z66 Do not resuscitate: Secondary | ICD-10-CM | POA: Diagnosis not present

## 2015-02-15 DIAGNOSIS — I428 Other cardiomyopathies: Secondary | ICD-10-CM | POA: Diagnosis not present

## 2015-02-15 DIAGNOSIS — E11319 Type 2 diabetes mellitus with unspecified diabetic retinopathy without macular edema: Secondary | ICD-10-CM | POA: Diagnosis not present

## 2015-02-15 DIAGNOSIS — Z7982 Long term (current) use of aspirin: Secondary | ICD-10-CM | POA: Insufficient documentation

## 2015-02-15 DIAGNOSIS — R000 Tachycardia, unspecified: Secondary | ICD-10-CM | POA: Diagnosis not present

## 2015-02-15 DIAGNOSIS — I5022 Chronic systolic (congestive) heart failure: Secondary | ICD-10-CM

## 2015-02-15 DIAGNOSIS — Z79899 Other long term (current) drug therapy: Secondary | ICD-10-CM | POA: Insufficient documentation

## 2015-02-15 DIAGNOSIS — K219 Gastro-esophageal reflux disease without esophagitis: Secondary | ICD-10-CM | POA: Insufficient documentation

## 2015-02-15 MED ORDER — TORSEMIDE 20 MG PO TABS: ORAL_TABLET | ORAL | Status: DC

## 2015-02-15 NOTE — Patient Instructions (Addendum)
Increase Torsemide to 80 mg in AM and 40 mg in PM  Your physician recommends that you schedule a follow-up appointment in: 1 week

## 2015-02-15 NOTE — Progress Notes (Signed)
Patient ID: Moishy Laday, male   DOB: 01/02/1946, 69 y.o.   MRN: [id number]  PCP: Dr. Worthy Rancher, FNP Primary Cardiologist: Dr. Tobias Alexander  HPI: Mr. Custis is a 69 yo male referred to the HF clinic by Dr.Nelson. He has a history of DM2 with severe diabetic retinopathy legally blind September , cataracts HTN, HLD, chronic systolic HF due to severe, EF 40% (11/14) s/p ICD Medtronic.   Started having problems with his heart in early 2013. Was followed by cardiologist at [facility]. Had cath in 2013 and arteries were "fine".  Admitted to [facility] in January with N/V/D. EGD  Showed severe gastritis and esophagitis. Started on Milrinone 0.25 mcg via PICC. Discharge weight 133 pounds. Followed by [facility].   Admitted 8/2 through 8/6 with volume overload. Diuresed with IV lasix and milrinone was increased to 0.375 mcg. Transitioned to torsemide 80 mg daily. Discharge weight was 137 pounds.   He presents today for HF follow up. He remains on milrinone 0.375 mcg. Overall feeling ok. But he is having significant edema. Weight at home up to 145 pounds. Able to perform ADLs. Mild dyspnea with exertion. Taking all medications but drinking a lot of water, his daughter is frustrated with his intake. Has [facility] following for home milrinone.   ECHO 05/05/13: EF 10%, diff HK, grade II DD, mild MR, LA mod dilated, RV mildly dilated and sys fx mod reduced, mod TR ECHO 11/04/13  EF 10% mod-severe MR RV mod reduced. TAPSE 1.6  ECHO 01/2015 EF 10-15%   Labs 07/15/13 K 5.7 Creatinine 1.95 instructed to cut lisinopril 5 mg daily.  Labs 07/22/13 K 3.6 Creatinine 0.81 Dig 1.9 digoxin stopped Labs  08/17/13 K 3.7 Creatinine 0.8 Glucose 190           08/20/13: K+ 3.7, Creatinine 0.70, Glucose 123, Na 137           10/22/13: K 3.9, creatinine 0.69, glucose 179           02/11/14: K 4.3 cr 1.2           07/05/2014: K 5.4 Creatinine 0.96 potassium cut back to 20 meq once a day.            07/12/2014: K 4.4 Creatinine 0.95              11/09/2014: K 3.6 Creatinine 1.04             6/16: K 3.9, creatinine 1.01, hgb 11.9             12/29/14: K 3.9 Creatinine 1.19              01/22/2015: K 4.4 Creatinine 0.89   SH: Lives with his daughter, son in law, and 2 granddaughters.     ROS: All systems negative except as listed in HPI, PMH and Problem List.  Past Medical History  Diagnosis Date  . Biventricular CHF (congestive heart failure)     a. Echo 8/16:  EF 10-15%, diff HK, trivial AI, mild MR, mild LAE, mod RVE, mild reduced RVF, mild TR, PASP 40 mmHg, mild eff without tamponade.   . Hypertension   . Impaired vision in both eyes   . AICD (automatic cardioverter/defibrillator) present   . Hyperlipidemia   . Type II diabetes mellitus   . Depression   . GERD (gastroesophageal reflux disease)   . Blind left eye   . NICM (nonischemic cardiomyopathy)     Current Outpatient Prescriptions  Medication Sig Dispense  Refill  . aspirin EC 81 MG tablet Take 81 mg by mouth daily.    . carvedilol (COREG) 3.125 MG tablet TAKE 1 TABLET BY MOUTH TWO TIMES DAILY WITH A MEAL 60 tablet 1  . insulin glargine (LANTUS) 100 UNIT/ML injection Inject 0.05 mLs (5 Units total) into the skin at bedtime. 10 mL 0  . Melatonin 10 MG TABS Take 10 mg by mouth at bedtime.    . milrinone (PRIMACOR) 20 MG/100ML SOLN infusion Inject 25.875 mcg/min into the vein continuous. 100 mL 999  . pantoprazole (PROTONIX) 20 MG tablet take 1 tablet by mouth once daily 30 tablet 5  . potassium chloride SA (K-DUR,KLOR-CON) 20 MEQ tablet Take 0.5 tablets (10 mEq total) by mouth 2 (two) times daily. 120 tablet 3  . sertraline (ZOLOFT) 50 MG tablet Take 50 mg by mouth daily.    . simvastatin (ZOCOR) 5 MG tablet Take 5 mg by mouth at bedtime.    . torsemide (DEMADEX) 20 MG tablet Take 4 tablets (80 mg total) by mouth daily. 120 tablet 3   No current facility-administered medications for this encounter.    Filed Vitals:   02/15/15 1518  BP: 98/72  Pulse: 104   Weight: 155 lb (70.308 kg)  SpO2: 92%    PHYSICAL EXAM: General: Chronically ill appearing. SOB walking in the clinic. Daughter present  HEENT: normal; arcus senilus.  Neck: supple. JVP to ear . Carotids 2+ bilaterally; no bruits. No lymphadenopathy or thryomegaly appreciated. Cor: PMI laterally displaced.Tachy regular. No rubs. + S3; +3/6 systolic murmur   Lungs: clear Abdomen:  nontender, ++distended. No hepatosplenomegaly. No bruits or masses. Good bowel sounds. Extremities: no cyanosis, clubbing, rash; RUE picc; 2+ edema to knees. Neuro: alert & orientedx3, cranial nerves grossly intact. Moves all 4 extremities w/o difficulty. Affect pleasant.  ASSESSMENT & PLAN:  1) Acute/Chronic biventricular HF: NICM s/p ICD. EF 10% mod-severe MR RV mod reduced. He is on chronic home milrinone. NYHA class IIIb. Overall he is doing fair.  Volume status elevated - Increase torsemide to 80/40. Will have him come in for a dose of IV lasix tomorrow as well. Long talk about the need to limit fluid intake.  - Continue current milrinone. - Continue coreg 3.125 mg BID for now. May need to stop.  - No ACEI with soft bp.  - Reinforced the need and importance of daily weights, a low sodium diet, and fluid restriction (less than 2 L a day). Instructed to call the HF clinic if weight increases more than 3 lbs overnight or 5 lbs in a week. 2)  Diabetes with diabetic retinopathy-   3) Sinus Tachycardia- Not candidate for ivabradine due to shock/milrinone.  4) DNR  Follow up in 1 week to reassess volume status. Next visit may need to stop carvedilol. Needs Hospice but with milrinone Hospice not willing to follow.     CLEGG,[name]  3:25 PM   Patient seen and examined with [name], NP. We discussed all aspects of the encounter. I agree with the assessment and plan as stated above.   Overall doing ok on home milrinone but has significant volume overload in setting of not watching his fluid intake. Agree  with diuretic changes as above. Continue milrinone.   Nadiah Corbit,MD 11:57 PM

## 2015-02-17 ENCOUNTER — Telehealth (HOSPITAL_COMMUNITY): Payer: Self-pay | Admitting: *Deleted

## 2015-02-17 NOTE — Telephone Encounter (Signed)
Pt daughter called about questions concerning Gary Gallegos. Tried to call back twice and left messages twice. Will continue to follow up.   Spoke with daughter and she is aware that he is take 80 mg in the am and 40 mg @ night.

## 2015-02-18 ENCOUNTER — Other Ambulatory Visit (HOSPITAL_COMMUNITY): Payer: Self-pay | Admitting: Internal Medicine

## 2015-02-22 ENCOUNTER — Telehealth (HOSPITAL_COMMUNITY): Payer: Self-pay

## 2015-02-22 NOTE — Telephone Encounter (Addendum)
[facility] RN called chf clinic triage to report update since IV lasix last week.  Weight before IV lasix infusion 151 lbs, today (6 days later) weight down to 134 lbs.  Patient feels better.  Daughter feels the torsemide is not working.  RN explained to daughter IV lasix stronger than tablet lasix, and that IV was harsh on kidneys.  Daughter did report she gave patient old metolazone Rx 5 mg tablet once on 9/3 that had been discontinued and he lost 7 lbs overnight.  RN advised not to do this anymore until seen by our office.  Will discuss diuretic regimen at their upcoming appointment this week on 9/8 in detail.  Ave Filter

## 2015-02-24 ENCOUNTER — Telehealth (HOSPITAL_COMMUNITY): Payer: Self-pay

## 2015-02-24 ENCOUNTER — Encounter (HOSPITAL_COMMUNITY): Payer: 59

## 2015-02-24 NOTE — Telephone Encounter (Signed)
Recent labs received from [facility] shows K of 2.7.  Per Amy Clegg NP-C, advised patient's daughter to have patient increase potassium intake to 40 meq BID.  [facility] to recheck next week.  Aware and agreeable.  Ave Filter

## 2015-02-25 ENCOUNTER — Other Ambulatory Visit (HOSPITAL_COMMUNITY): Payer: Self-pay | Admitting: Internal Medicine

## 2015-03-01 ENCOUNTER — Ambulatory Visit (HOSPITAL_COMMUNITY)
Admission: RE | Admit: 2015-03-01 | Discharge: 2015-03-01 | Disposition: A | Discharge status: Home / Self Care | Payer: Medicare Other | Source: Ambulatory Visit | Attending: Internal Medicine | Admitting: Internal Medicine

## 2015-03-01 VITALS — BP 116/68 | HR 118 | Wt 142.8 lb

## 2015-03-01 DIAGNOSIS — I471 Supraventricular tachycardia: Secondary | ICD-10-CM

## 2015-03-01 DIAGNOSIS — I5022 Chronic systolic (congestive) heart failure: Secondary | ICD-10-CM

## 2015-03-01 DIAGNOSIS — R000 Tachycardia, unspecified: Secondary | ICD-10-CM

## 2015-03-01 DIAGNOSIS — Z66 Do not resuscitate: Secondary | ICD-10-CM | POA: Diagnosis not present

## 2015-03-01 DIAGNOSIS — Z794 Long term (current) use of insulin: Secondary | ICD-10-CM | POA: Insufficient documentation

## 2015-03-01 DIAGNOSIS — E08311 Diabetes mellitus due to underlying condition with unspecified diabetic retinopathy with macular edema: Secondary | ICD-10-CM | POA: Diagnosis not present

## 2015-03-01 MED ORDER — METOLAZONE 5 MG PO TABS: 5.0000 mg | ORAL_TABLET | ORAL | Status: DC | PRN

## 2015-03-01 NOTE — Progress Notes (Signed)
Patient ID: Gary Gallegos, male   DOB: Mar 05, 1946, 69 y.o.   MRN: [id number]  PCP: Dr. Worthy Rancher, FNP Primary Cardiologist: Dr. Tobias Alexander Primary HF Cardiologist: Dr Shirlee Latch.   HPI: Gary Gallegos is a 69 yo male referred to the HF clinic by Dr.Nelson. He has a history of DM2 with severe diabetic retinopathy legally blind September , cataracts HTN, HLD, chronic systolic HF due to severe, EF 29% (11/14) s/p ICD Medtronic.   Started having problems with his heart in early 2013. Was followed by cardiologist at [facility]. Had cath in 2013 and arteries were "fine".  Admitted to [facility] in January with N/V/D. EGD  Showed severe gastritis and esophagitis. Started on Milrinone 0.25 mcg via PICC. Discharge weight 133 pounds. Followed by [facility].   Admitted 8/2 through 8/6 with volume overload. Diuresed with IV lasix and milrinone was increased to 0.375 mcg. Transitioned to torsemide 80 mg daily. Discharge weight was 137 pounds.   He presents today for HF follow up. Since the last visit he was give IV lasix with poor response. He took metolazone and diuresed 14 pounds. He remains on milrinone 0.375 mcg. Overall feeling ok.  Mild dyspnea with exertion.Continues to drink extra fluids. Taking all medications. Daughter provides all medications. Has [facility] following for home milrinone.   ECHO 05/05/13: EF 10%, diff HK, grade II DD, mild MR, LA mod dilated, RV mildly dilated and sys fx mod reduced, mod TR ECHO 11/04/13  EF 10% mod-severe MR RV mod reduced. TAPSE 1.6  ECHO 01/2015 EF 10-15%   Labs 07/15/13 K 5.7 Creatinine 1.95 instructed to cut lisinopril 5 mg daily.  Labs 07/22/13 K 3.6 Creatinine 0.81 Dig 1.9 digoxin stopped Labs  08/17/13 K 3.7 Creatinine 0.8 Glucose 190           08/20/13: K+ 3.7, Creatinine 0.70, Glucose 123, Na 137           10/22/13: K 3.9, creatinine 0.69, glucose 179           02/11/14: K 4.3 cr 1.2           07/05/2014: K 5.4 Creatinine 0.96 potassium cut back to 20 meq once a day.       07/12/2014: K 4.4 Creatinine 0.95             11/09/2014: K 3.6 Creatinine 1.04             6/16: K 3.9, creatinine 1.01, hgb 11.9             12/29/14: K 3.9 Creatinine 1.19              01/22/2015: K 4.4 Creatinine 0.89   SH: Lives with his daughter, son in law, and 2 granddaughters.     ROS: All systems negative except as listed in HPI, PMH and Problem List.  Past Medical History  Diagnosis Date  . Biventricular CHF (congestive heart failure)     a. Echo 8/16:  EF 10-15%, diff HK, trivial AI, mild MR, mild LAE, mod RVE, mild reduced RVF, mild TR, PASP 40 mmHg, mild eff without tamponade.   . Hypertension   . Impaired vision in both eyes   . AICD (automatic cardioverter/defibrillator) present   . Hyperlipidemia   . Type II diabetes mellitus   . Depression   . GERD (gastroesophageal reflux disease)   . Blind left eye   . NICM (nonischemic cardiomyopathy)     Current Outpatient Prescriptions  Medication Sig Dispense Refill  .  aspirin EC 81 MG tablet Take 81 mg by mouth daily.    . carvedilol (COREG) 3.125 MG tablet TAKE 1 TABLET BY MOUTH TWO TIMES DAILY WITH A MEAL 60 tablet 1  . insulin glargine (LANTUS) 100 UNIT/ML injection Inject 0.05 mLs (5 Units total) into the skin at bedtime. 10 mL 0  . Melatonin 10 MG TABS Take 10 mg by mouth at bedtime.    . milrinone (PRIMACOR) 20 MG/100ML SOLN infusion Inject 25.875 mcg/min into the vein continuous. 100 mL 999  . pantoprazole (PROTONIX) 20 MG tablet take 1 tablet by mouth once daily 30 tablet 5  . potassium chloride SA (K-DUR,KLOR-CON) 20 MEQ tablet Take 0.5 tablets (10 mEq total) by mouth 2 (two) times daily. 120 tablet 3  . sertraline (ZOLOFT) 50 MG tablet Take 50 mg by mouth daily.    . simvastatin (ZOCOR) 5 MG tablet Take 5 mg by mouth at bedtime.    . torsemide (DEMADEX) 20 MG tablet Take 4 tabs in AM and 2 tabs in PM 180 tablet 3   No current facility-administered medications for this encounter.    Filed Vitals:   03/01/15  1234  BP: 116/68  Pulse: 118  Weight: 142 lb 12.8 oz (64.774 kg)  SpO2: 99%    PHYSICAL EXAM: General: Chronically ill appearing. Arrived in wheel chair. Daughter present  HEENT: normal; arcus senilus.  Neck: supple. JVP  7-8 . Carotids 2+ bilaterally; no bruits. No lymphadenopathy or thryomegaly appreciated. Cor: PMI laterally displaced.Tachy regular. No rubs. + S3; +3/6 systolic murmur   Lungs: clear Abdomen:  nontender, ++distended. No hepatosplenomegaly. No bruits or masses. Good bowel sounds. Extremities: no cyanosis, clubbing, rash; RUE picc; No edema in lower extremities. Neuro: alert & orientedx3, cranial nerves grossly intact. Moves all 4 extremities w/o difficulty. Affect pleasant.  ASSESSMENT & PLAN:  1) Acute/Chronic biventricular HF: NICM s/p ICD. EF 10% mod-severe MR RV mod reduced. He is on chronic home milrinone. NYHA class IIIb. Overall he is doing a little better that last time.  Volume status stable.  Continue  torsemide to 80 mg /40 mg . Add metolazone 5 mg as needed for edema.  - Continue current milrinone. - Continue coreg 3.125 mg BID for now. May need to stop.  - No ACEI with soft bp.  - Reinforced the need and importance of daily weights, a low sodium diet, and fluid restriction (less than 2 L a day). Instructed to call the HF clinic if weight increases more than 3 lbs overnight or 5 lbs in a week. 2)  Diabetes with diabetic retinopathy-   3) Sinus Tachycardia- Not candidate for ivabradine due to shock/milrinone.  4) DNR  Follow up in 4 weeks.   CLEGG,[name]  12:51 PM

## 2015-03-01 NOTE — Patient Instructions (Signed)
FOLLOW UP : 4 weeks (NP clinic)  TAKE 5mg  Metolazone (1 tablet)  As needed for increased edema

## 2015-03-01 NOTE — Progress Notes (Signed)
Advanced Heart Failure Medication Review by a Pharmacist  Does the patient  feel that his/her medications are working for him/her?  yes  Has the patient been experiencing any side effects to the medications prescribed?  no  Does the patient measure his/her own blood pressure or blood glucose at home?  yes   Does the patient have any problems obtaining medications due to transportation or finances?   no  Understanding of regimen: good Understanding of indications: good Potential of compliance: good    Pharmacist comments:  Mr. Dipace is a pleasant 69 yo M presenting with his daughter and without a medication list. His daughter manages his medications and has an excellent understanding of his regimen. She did state that about a week ago she gave him an old metolazone 5 mg tablet and he lost ~14 lbs in 2 days. They did not have any other medication-related questions or concerns at this time.   Tyler Deis. Bonnye Fava, PharmD, BCPS, CPP Clinical Pharmacist Pager: [phone] Phone: [phone] 03/01/2015 12:41 PM

## 2015-03-04 ENCOUNTER — Other Ambulatory Visit (HOSPITAL_COMMUNITY): Payer: Self-pay | Admitting: Internal Medicine

## 2015-03-10 ENCOUNTER — Telehealth (HOSPITAL_COMMUNITY): Payer: Self-pay | Admitting: Cardiology

## 2015-03-10 MED ORDER — POTASSIUM CHLORIDE CRYS ER 20 MEQ PO TBCR: EXTENDED_RELEASE_TABLET | ORAL | Status: DC

## 2015-03-10 NOTE — Telephone Encounter (Signed)
Abnormal labs Labs drawn 03/08/15 k 3.8 Per Joanell Rising Patient should increase potassium to 20 meq in the AM and 10 meq in the PM  pts daughter [name]) aware and voiced understanding

## 2015-03-15 ENCOUNTER — Encounter: Payer: Self-pay | Admitting: Internal Medicine

## 2015-03-18 ENCOUNTER — Other Ambulatory Visit (HOSPITAL_COMMUNITY): Payer: Self-pay | Admitting: Internal Medicine

## 2015-03-24 ENCOUNTER — Other Ambulatory Visit (HOSPITAL_COMMUNITY): Payer: Self-pay | Admitting: Internal Medicine

## 2015-03-25 ENCOUNTER — Other Ambulatory Visit: Payer: Self-pay | Admitting: Family

## 2015-03-29 ENCOUNTER — Encounter (HOSPITAL_COMMUNITY): Payer: 59

## 2015-03-31 ENCOUNTER — Telehealth (HOSPITAL_COMMUNITY): Payer: Self-pay

## 2015-03-31 MED ORDER — POTASSIUM CHLORIDE CRYS ER 20 MEQ PO TBCR: EXTENDED_RELEASE_TABLET | ORAL | Status: DC

## 2015-03-31 MED ORDER — MAGNESIUM 200 MG PO TABS: 200.0000 mg | ORAL_TABLET | ORAL | Status: DC

## 2015-03-31 NOTE — Telephone Encounter (Signed)
Talked with wife and told her that her husband is to increase potasium to 20 mEq twice a day and add Mg+ 200 mg daily.  Repeated instructions back to me.  New Rx sent for Mg+ and updated rx for K+

## 2015-04-01 ENCOUNTER — Encounter: Payer: Self-pay | Admitting: Internal Medicine

## 2015-04-05 ENCOUNTER — Telehealth (HOSPITAL_COMMUNITY): Payer: Self-pay

## 2015-04-05 NOTE — Telephone Encounter (Signed)
Yazmine from [facility] called and said that patient had 6 lb weight gain.  No shortness of breath.  No change in adb girth and no extremity edema. Had indigestion yesterday after eating out; vomited and today feels fine O2 sat 99%

## 2015-04-07 ENCOUNTER — Other Ambulatory Visit (HOSPITAL_COMMUNITY): Payer: Self-pay | Admitting: Internal Medicine

## 2015-04-07 NOTE — Telephone Encounter (Signed)
Please f/u with hi, if weight still up have him take extra diuretic for 2 days.

## 2015-04-08 ENCOUNTER — Other Ambulatory Visit (HOSPITAL_COMMUNITY): Payer: Self-pay | Admitting: Internal Medicine

## 2015-04-08 NOTE — Telephone Encounter (Signed)
Called patient and LVMTCB

## 2015-04-08 NOTE — Telephone Encounter (Addendum)
Telephone order given to [facility] at the [place] office of [facility] to give him an extra diuretic  for 2 days if his weight remains up. An additional torsemide 20 mg in the afternoon for two days

## 2015-04-11 ENCOUNTER — Other Ambulatory Visit (HOSPITAL_COMMUNITY): Payer: Self-pay | Admitting: Internal Medicine

## 2015-04-12 ENCOUNTER — Ambulatory Visit (HOSPITAL_COMMUNITY)
Admission: RE | Admit: 2015-04-12 | Discharge: 2015-04-12 | Disposition: A | Discharge status: Home / Self Care | Payer: Medicare Other | Source: Ambulatory Visit | Attending: Internal Medicine | Admitting: Internal Medicine

## 2015-04-12 VITALS — BP 86/64 | HR 101 | Wt 136.4 lb

## 2015-04-12 DIAGNOSIS — K219 Gastro-esophageal reflux disease without esophagitis: Secondary | ICD-10-CM | POA: Diagnosis not present

## 2015-04-12 DIAGNOSIS — Z95810 Presence of automatic (implantable) cardiac defibrillator: Secondary | ICD-10-CM | POA: Diagnosis not present

## 2015-04-12 DIAGNOSIS — R000 Tachycardia, unspecified: Secondary | ICD-10-CM | POA: Diagnosis not present

## 2015-04-12 DIAGNOSIS — I110 Hypertensive heart disease with heart failure: Secondary | ICD-10-CM | POA: Insufficient documentation

## 2015-04-12 DIAGNOSIS — H548 Legal blindness, as defined in USA: Secondary | ICD-10-CM | POA: Insufficient documentation

## 2015-04-12 DIAGNOSIS — E11319 Type 2 diabetes mellitus with unspecified diabetic retinopathy without macular edema: Secondary | ICD-10-CM | POA: Insufficient documentation

## 2015-04-12 DIAGNOSIS — Z794 Long term (current) use of insulin: Secondary | ICD-10-CM | POA: Insufficient documentation

## 2015-04-12 DIAGNOSIS — I5022 Chronic systolic (congestive) heart failure: Secondary | ICD-10-CM | POA: Diagnosis present

## 2015-04-12 DIAGNOSIS — I509 Heart failure, unspecified: Secondary | ICD-10-CM | POA: Diagnosis not present

## 2015-04-12 DIAGNOSIS — E785 Hyperlipidemia, unspecified: Secondary | ICD-10-CM | POA: Diagnosis not present

## 2015-04-12 DIAGNOSIS — I428 Other cardiomyopathies: Secondary | ICD-10-CM | POA: Insufficient documentation

## 2015-04-12 DIAGNOSIS — Z7982 Long term (current) use of aspirin: Secondary | ICD-10-CM | POA: Diagnosis not present

## 2015-04-12 DIAGNOSIS — Z79899 Other long term (current) drug therapy: Secondary | ICD-10-CM | POA: Insufficient documentation

## 2015-04-12 DIAGNOSIS — Z66 Do not resuscitate: Secondary | ICD-10-CM

## 2015-04-12 DIAGNOSIS — F329 Major depressive disorder, single episode, unspecified: Secondary | ICD-10-CM | POA: Insufficient documentation

## 2015-04-12 DIAGNOSIS — I10 Essential (primary) hypertension: Secondary | ICD-10-CM | POA: Insufficient documentation

## 2015-04-12 NOTE — Progress Notes (Signed)
Advanced Heart Failure Medication Review by a Pharmacist  Does the patient  feel that his/her medications are working for him/her?  yes  Has the patient been experiencing any side effects to the medications prescribed?  no  Does the patient measure his/her own blood pressure or blood glucose at home?  yes   Does the patient have any problems obtaining medications due to transportation or finances?   no  Understanding of regimen: good Understanding of indications: good Potential of compliance: good Patient understands to avoid NSAIDs. Patient understands to avoid decongestants.  Issues to address at subsequent visits: None   Pharmacist comments:  Gary Gallegos is a pleasant 69 yo M presenting with his daughter and without a medication list. His daughter continues to manage his medications and reports good compliance. He did not have any other medication-related questions or concerns at this time.  Tyler Deis. Bonnye Fava, PharmD, BCPS, CPP Clinical Pharmacist Pager: [phone] Phone: [phone] 04/12/2015 12:07 PM    Time with patient: 4 minutes Preparation and documentation time: 2 minutes Total time: 6 minutes

## 2015-04-12 NOTE — Progress Notes (Signed)
Patient ID: Gary Gallegos, male   DOB: 1946-04-10, 69 y.o.   MRN: [id number]  PCP: Dr. Worthy Rancher, FNP Primary Cardiologist: Dr. Tobias Alexander Primary HF Cardiologist: Dr Shirlee Latch.   HPI: Gary Gallegos is a 69 yo male referred to the HF clinic by Dr.Nelson. He has a history of DM2 with severe diabetic retinopathy legally blind September , cataracts HTN, HLD, chronic systolic HF due to severe, EF 30% (11/14) s/p ICD Medtronic.   Started having problems with his heart in early 2013. Was followed by cardiologist at [facility]. Had cath in 2013 and arteries were "fine".  Admitted to [facility] in January with N/V/D. EGD  Showed severe gastritis and esophagitis. Started on Milrinone 0.25 mcg via PICC. Discharge weight 133 pounds. Followed by [facility].   Admitted 8/2 through 8/6 with volume overload. Diuresed with IV lasix and milrinone was increased to 0.375 mcg. Transitioned to torsemide 80 mg daily. Discharge weight was 137 pounds.   He presents today for HF follow up. Overall feeling much better. Denies SOB/PND/Orthopnea. Weight at home 137-138 pounds. Taking metolazone once a week. He remains on milrinone 0.375 mcg. Taking all medications. Daughter provides all medications. Has [facility] following for home milrinone.   ECHO 05/05/13: EF 10%, diff HK, grade II DD, mild MR, LA mod dilated, RV mildly dilated and sys fx mod reduced, mod TR ECHO 11/04/13  EF 10% mod-severe MR RV mod reduced. TAPSE 1.6  ECHO 01/2015 EF 10-15%   Labs 07/15/13 K 5.7 Creatinine 1.95 instructed to cut lisinopril 5 mg daily.  Labs 07/22/13 K 3.6 Creatinine 0.81 Dig 1.9 digoxin stopped Labs  08/17/13 K 3.7 Creatinine 0.8 Glucose 190           08/20/13: K+ 3.7, Creatinine 0.70, Glucose 123, Na 137           10/22/13: K 3.9, creatinine 0.69, glucose 179           02/11/14: K 4.3 cr 1.2           07/05/2014: K 5.4 Creatinine 0.96 potassium cut back to 20 meq once a day.            07/12/2014: K 4.4 Creatinine 0.95             11/09/2014: K 3.6  Creatinine 1.04             6/16: K 3.9, creatinine 1.01, hgb 11.9             12/29/14: K 3.9 Creatinine 1.19              01/22/2015: K 4.4 Creatinine 0.89   SH: Lives with his daughter, son in law, and 2 granddaughters.     ROS: All systems negative except as listed in HPI, PMH and Problem List.  Past Medical History  Diagnosis Date  . Biventricular CHF (congestive heart failure)     a. Echo 8/16:  EF 10-15%, diff HK, trivial AI, mild MR, mild LAE, mod RVE, mild reduced RVF, mild TR, PASP 40 mmHg, mild eff without tamponade.   . Hypertension   . Impaired vision in both eyes   . AICD (automatic cardioverter/defibrillator) present   . Hyperlipidemia   . Type II diabetes mellitus   . Depression   . GERD (gastroesophageal reflux disease)   . Blind left eye   . NICM (nonischemic cardiomyopathy)     Current Outpatient Prescriptions  Medication Sig Dispense Refill  . aspirin EC 81 MG tablet Take 81 mg  by mouth daily.    . carvedilol (COREG) 3.125 MG tablet TAKE 1 TABLET BY MOUTH TWO TIMES DAILY WITH A MEAL 60 tablet 1  . insulin glargine (LANTUS) 100 UNIT/ML injection Inject 0.05 mLs (5 Units total) into the skin at bedtime. 10 mL 0  . Magnesium 200 MG TABS Take 1 tablet (200 mg total) by mouth 1 day or 1 dose. 60 each 3  . Melatonin 10 MG TABS Take 10 mg by mouth at bedtime.    . metolazone (ZAROXOLYN) 5 MG tablet Take 1 tablet (5 mg total) by mouth as needed (for increased edema). 30 tablet 3  . milrinone (PRIMACOR) 20 MG/100ML SOLN infusion Inject 25.875 mcg/min into the vein continuous. 100 mL 999  . pantoprazole (PROTONIX) 20 MG tablet take 1 tablet by mouth once daily 30 tablet 5  . potassium chloride SA (K-DUR,KLOR-CON) 20 MEQ tablet Take one 20 mEq tablet twice a day 180 tablet 3  . sertraline (ZOLOFT) 50 MG tablet Take 50 mg by mouth daily.    . simvastatin (ZOCOR) 5 MG tablet Take 5 mg by mouth at bedtime.    . torsemide (DEMADEX) 20 MG tablet Take 4 tabs in AM and 2 tabs in  PM 180 tablet 3   No current facility-administered medications for this encounter.    Filed Vitals:   04/12/15 1155  BP: 86/64  Pulse: 101  Weight: 136 lb 6.4 oz (61.871 kg)  SpO2: 100%    PHYSICAL EXAM: General: Chronically ill appearing. Arrived in wheel chair. Daughter present  HEENT: normal; arcus senilus.  Neck: supple. JVP  5-6 . Carotids 2+ bilaterally; no bruits. No lymphadenopathy or thryomegaly appreciated. Cor: PMI laterally displaced.Tachy regular. No rubs. + S3; +3/6 systolic murmur   Lungs: clear Abdomen:  nontender, nondistended. No hepatosplenomegaly. No bruits or masses. Good bowel sounds. Extremities: no cyanosis, clubbing, rash; RUE picc; No edema in lower extremities. Neuro: alert & orientedx3, cranial nerves grossly intact. Moves all 4 extremities w/o difficulty. Affect pleasant.  ASSESSMENT & PLAN:  1) Chronic biventricular HF: NICM s/p ICD. EF 10% mod-severe MR RV mod reduced. He is on chronic home milrinone.  NYHA class IIIb. Doing great today. Volume status stable. Continue  torsemide to 80 mg /40 mg . Continue metolazone 5 mg as needed for edema.  - Continue current milrinone. - Continue coreg 3.125 mg BID for now. May need to stop.  - No ACEI with soft bp.  - Reinforced the need and importance of daily weights, a low sodium diet, and fluid restriction (less than 2 L a day). Instructed to call the HF clinic if weight increases more than 3 lbs overnight or 5 lbs in a week. 2)  Diabetes with diabetic retinopathy-   3) Sinus Tachycardia- Not candidate for ivabradine due to shock/milrinone.  4) DNR  Follow up in 6 weeks.   Dezirae Service NP-C  11:54 AM

## 2015-04-12 NOTE — Patient Instructions (Signed)
Follow-up in 6 weeks

## 2015-04-15 ENCOUNTER — Telehealth (HOSPITAL_COMMUNITY): Payer: Self-pay

## 2015-04-15 ENCOUNTER — Other Ambulatory Visit (HOSPITAL_COMMUNITY): Payer: Self-pay | Admitting: Internal Medicine

## 2015-04-15 NOTE — Telephone Encounter (Signed)
[facility] called reported patients weight gain from 129.6 to 136.2 lbs this week No change in abdominal girth or ankle ede,a BP 100/60  Talked with daughter.  She typically gives him a Metolazone once a week but hasn't yet.  She is going to give him a Metolazone today

## 2015-04-20 ENCOUNTER — Telehealth (HOSPITAL_COMMUNITY): Payer: Self-pay | Admitting: *Deleted

## 2015-04-20 NOTE — Telephone Encounter (Signed)
Received labs from [facility] drawn 11/1:  K 2.9 Bun 42 CR 1.04  Per Amy Clegg, NP have pt increase KCL to 40 meq Twice daily.  Attempted to call pt's daughter [name], no answer and VM is full.  Will try again later

## 2015-04-21 MED ORDER — POTASSIUM CHLORIDE CRYS ER 20 MEQ PO TBCR: 40.0000 meq | EXTENDED_RELEASE_TABLET | Freq: Two times a day (BID) | ORAL | Status: DC

## 2015-04-21 NOTE — Telephone Encounter (Signed)
Still unable to reach pt's daughter, was able to leave a VM today for her to call us back

## 2015-04-21 NOTE — Telephone Encounter (Signed)
Spoke w/pt's daughter, [name], she is aware and agreeable, new rx sent in

## 2015-04-26 ENCOUNTER — Other Ambulatory Visit (HOSPITAL_COMMUNITY): Payer: Self-pay | Admitting: Internal Medicine

## 2015-04-27 ENCOUNTER — Telehealth (HOSPITAL_COMMUNITY): Payer: Self-pay

## 2015-04-27 NOTE — Telephone Encounter (Signed)
LVMTCB   Patient should be taking K+ 40 meq twice a day. Have him add an extra 20 meq (one tablet) today, Thursday and Friday and schedule him to come in for a BMET next week  

## 2015-04-28 NOTE — Telephone Encounter (Signed)
LVMTCB   Patient should be taking K+ 40 meq twice a day. Have him add an extra 20 meq (one tablet) today, Thursday and Friday and schedule him to come in for a BMET next week

## 2015-04-29 ENCOUNTER — Other Ambulatory Visit: Payer: Self-pay | Admitting: Family

## 2015-04-29 ENCOUNTER — Other Ambulatory Visit (HOSPITAL_COMMUNITY): Payer: Self-pay | Admitting: *Deleted

## 2015-04-29 MED ORDER — CARVEDILOL 3.125 MG PO TABS: ORAL_TABLET | ORAL | Status: DC

## 2015-05-03 ENCOUNTER — Telehealth (HOSPITAL_COMMUNITY): Payer: Self-pay

## 2015-05-03 NOTE — Telephone Encounter (Signed)
Opened in error

## 2015-05-04 ENCOUNTER — Telehealth (HOSPITAL_COMMUNITY): Payer: Self-pay

## 2015-05-04 NOTE — Telephone Encounter (Signed)
LVMTCB

## 2015-05-06 ENCOUNTER — Telehealth (HOSPITAL_COMMUNITY): Payer: Self-pay

## 2015-05-06 MED ORDER — POTASSIUM CHLORIDE CRYS ER 20 MEQ PO TBCR: 40.0000 meq | EXTENDED_RELEASE_TABLET | Freq: Three times a day (TID) | ORAL | Status: DC

## 2015-05-06 NOTE — Telephone Encounter (Signed)
Called patient and talked to sister who is going to tell her brother (the patient) to increase his KCL to 40 mEq three times a day

## 2015-05-16 ENCOUNTER — Telehealth (HOSPITAL_COMMUNITY): Payer: Self-pay

## 2015-05-16 NOTE — Telephone Encounter (Signed)
Gary Gallegos, [facility] given verbal order from Dr. [name] to re certify patient for home care for weekly milirone bag and dressing chnages

## 2015-05-23 ENCOUNTER — Encounter: Payer: Self-pay | Admitting: Internal Medicine

## 2015-05-24 ENCOUNTER — Inpatient Hospital Stay (HOSPITAL_COMMUNITY): Admission: RE | Admit: 2015-05-24 | Payer: Medicare Other | Source: Ambulatory Visit

## 2015-05-26 ENCOUNTER — Telehealth (HOSPITAL_COMMUNITY): Payer: Self-pay | Admitting: *Deleted

## 2015-05-26 NOTE — Telephone Encounter (Signed)
Spoke with pts daughter she said pt is taking potassium bid. I told her to hold his evening dose tonight and that pt should start potassium daily. Per ANDY

## 2015-05-31 ENCOUNTER — Ambulatory Visit (HOSPITAL_COMMUNITY)
Admission: RE | Admit: 2015-05-31 | Discharge: 2015-05-31 | Disposition: A | Discharge status: Home / Self Care | Payer: Medicare Other | Source: Ambulatory Visit | Attending: Internal Medicine | Admitting: Internal Medicine

## 2015-05-31 ENCOUNTER — Other Ambulatory Visit (HOSPITAL_COMMUNITY): Payer: Self-pay | Admitting: Internal Medicine

## 2015-05-31 VITALS — BP 90/60 | HR 123 | Wt 134.0 lb

## 2015-05-31 DIAGNOSIS — Z66 Do not resuscitate: Secondary | ICD-10-CM | POA: Diagnosis not present

## 2015-05-31 DIAGNOSIS — L988 Other specified disorders of the skin and subcutaneous tissue: Secondary | ICD-10-CM

## 2015-05-31 DIAGNOSIS — I5022 Chronic systolic (congestive) heart failure: Secondary | ICD-10-CM | POA: Diagnosis not present

## 2015-05-31 DIAGNOSIS — R238 Other skin changes: Secondary | ICD-10-CM

## 2015-05-31 NOTE — Patient Instructions (Signed)
Doing well  Your physician recommends that you schedule a follow-up appointment in: 6 weeks  Do the following things EVERYDAY: 1) Weigh yourself in the morning before breakfast. Write it down and keep it in a log. 2) Take your medicines as prescribed 3) Eat low salt foods-Limit salt (sodium) to 2000 mg per day.  4) Stay as active as you can everyday 5) Limit all fluids for the day to less than 2 liters 6)

## 2015-05-31 NOTE — Progress Notes (Signed)
Patient ID: Gary Gallegos, male   DOB: 1946-03-18, 69 y.o.   MRN: [id number]  PCP: Dr. Worthy Rancher, FNP Primary Cardiologist: Dr. Tobias Alexander Primary HF Cardiologist: Dr Shirlee Latch.   HPI: Gary Gallegos is a 69 yo male referred to the HF clinic by Dr.Nelson. He has a history of DM2 with severe diabetic retinopathy legally blind September , cataracts HTN, HLD, chronic systolic HF due to severe, EF 69% (11/14) s/p ICD Medtronic.   Started having problems with his heart in early 2013. Was followed by cardiologist at [facility]. Had cath in 2013 and arteries were "fine".  Admitted to [facility] in January with N/V/D. EGD  Showed severe gastritis and esophagitis. Started on Milrinone 0.25 mcg via PICC. Discharge weight 133 pounds. Followed by [facility].   Admitted 8/2 through 8/6 with volume overload. Diuresed with IV lasix and milrinone was increased to 0.375 mcg. Transitioned to torsemide 80 mg daily. Discharge weight was 137 pounds.   He presents today for HF follow up with his daughter.  Overall feeling ok. Denies SOB/PND/Orthopnea. Weight at home 130 pounds. Taking metolazone about once a week. He remains on milrinone 0.375 mcg. Taking all medications. Daughter provides all medications. Has [facility] following for home milrinone.   ECHO 05/05/13: EF 10%, diff HK, grade II DD, mild MR, LA mod dilated, RV mildly dilated and sys fx mod reduced, mod TR ECHO 11/04/13  EF 10% mod-severe MR RV mod reduced. TAPSE 1.6  ECHO 01/2015 EF 10-15%   Labs 07/15/13 K 5.7 Creatinine 1.95 instructed to cut lisinopril 5 mg daily.  Labs 07/22/13 K 3.6 Creatinine 0.81 Dig 1.9 digoxin stopped Labs  08/17/13 K 3.7 Creatinine 0.8 Glucose 190           08/20/13: K+ 3.7, Creatinine 0.70, Glucose 123, Na 137           10/22/13: K 3.9, creatinine 0.69, glucose 179           02/11/14: K 4.3 cr 1.2           07/05/2014: K 5.4 Creatinine 0.96 potassium cut back to 20 meq once a day.            07/12/2014: K 4.4 Creatinine 0.95   11/09/2014: K 3.6 Creatinine 1.04             6/16: K 3.9, creatinine 1.01, hgb 11.9             12/29/14: K 3.9 Creatinine 1.19              01/22/2015: K 4.4 Creatinine 0.89              05/10/2015: K 4.3 Creatinine 1.04   SH: Lives with his daughter, son in law, and 2 granddaughters.     ROS: All systems negative except as listed in HPI, PMH and Problem List.  Past Medical History  Diagnosis Date  . Biventricular CHF (congestive heart failure)     a. Echo 8/16:  EF 10-15%, diff HK, trivial AI, mild MR, mild LAE, mod RVE, mild reduced RVF, mild TR, PASP 40 mmHg, mild eff without tamponade.   . Hypertension   . Impaired vision in both eyes   . AICD (automatic cardioverter/defibrillator) present   . Hyperlipidemia   . Type II diabetes mellitus   . Depression   . GERD (gastroesophageal reflux disease)   . Blind left eye   . NICM (nonischemic cardiomyopathy)     Current Outpatient Prescriptions  Medication Sig Dispense  Refill  . aspirin EC 81 MG tablet Take 81 mg by mouth daily.    . carvedilol (COREG) 3.125 MG tablet TAKE 1 TABLET BY MOUTH TWO TIMES DAILY WITH A MEAL 60 tablet 1  . insulin glargine (LANTUS) 100 UNIT/ML injection Inject 0.05 mLs (5 Units total) into the skin at bedtime. 10 mL 0  . Magnesium 200 MG TABS Take 200 mg by mouth daily.    . Melatonin 10 MG TABS Take 10 mg by mouth at bedtime.    . metolazone (ZAROXOLYN) 5 MG tablet Take 1 tablet (5 mg total) by mouth as needed (for increased edema). 30 tablet 3  . milrinone (PRIMACOR) 20 MG/100ML SOLN infusion Inject 25.875 mcg/min into the vein continuous. 100 mL 999  . pantoprazole (PROTONIX) 20 MG tablet take 1 tablet by mouth once daily 30 tablet 5  . potassium chloride SA (K-DUR,KLOR-CON) 20 MEQ tablet Take 2 tablets (40 mEq total) by mouth 3 (three) times daily. 120 tablet 3  . sertraline (ZOLOFT) 50 MG tablet Take 50 mg by mouth daily.    . simvastatin (ZOCOR) 5 MG tablet Take 5 mg by mouth at bedtime.    .  torsemide (DEMADEX) 20 MG tablet Take 4 tabs in AM and 2 tabs in PM 180 tablet 3   No current facility-administered medications for this encounter.    Filed Vitals:   05/31/15 1521  BP: 90/60  Pulse: 123  Weight: 134 lb (60.782 kg)  SpO2: 100%    PHYSICAL EXAM: General: Chronically ill appearing. Arrived in wheel chair. Daughter present  HEENT: normal; arcus senilus.  Neck: supple. JVP  5-6 . Carotids 2+ bilaterally; no bruits. No lymphadenopathy or thryomegaly appreciated. Cor: PMI laterally displaced.Tachy regular. No rubs. + S3; +3/6 systolic murmur   Lungs: clear Abdomen:  nontender, nondistended. No hepatosplenomegaly. No bruits or masses. Good bowel sounds. Extremities: no cyanosis, clubbing, rash; RUE picc; No edema in lower extremities. L heel bulla 10x8 cm .  Neuro: alert & orientedx3, cranial nerves grossly intact. Moves all 4 extremities w/o difficulty. Affect pleasant.  ASSESSMENT & PLAN:  1) Chronic biventricular HF: NICM s/p ICD. EF 10% mod-severe MR RV mod reduced. He is on chronic home milrinone.  NYHA class IIIb. He remains stable on milrinone.  Volume status stable. Continue  torsemide to 80 mg /40 mg . Continue metolazone 5 mg as needed for edema.  - Continue current milrinone. - Continue coreg 3.125 mg BID for now. May need to stop if BP drops.  - No ACEI with soft bp.  - Reinforced the need and importance of daily weights, a low sodium diet, and fluid restriction (less than 2 L a day). Instructed to call the HF clinic if weight increases more than 3 lbs overnight or 5 lbs in a week. 2)  Diabetes with diabetic retinopathy-   3) Sinus Tachycardia- Not candidate for ivabradine due to shock/milrinone.  4) DNR 5)  Skin /L heel Bulla- Excisional sharp debridement performed with scissor and forcep. Drained moderate serous exudate. No odor. Start foam dressing with Kerlix. Change weekly. Instructed his daughter to contact PCP if he develops fever.   Follow up in 6  weeks.   Amy Clegg NP-C  3:26 PM

## 2015-06-05 ENCOUNTER — Encounter (HOSPITAL_COMMUNITY): Payer: Self-pay

## 2015-06-06 ENCOUNTER — Other Ambulatory Visit (HOSPITAL_COMMUNITY): Payer: Self-pay | Admitting: *Deleted

## 2015-06-06 MED ORDER — SIMVASTATIN 5 MG PO TABS: 5.0000 mg | ORAL_TABLET | Freq: Every day | ORAL | Status: DC

## 2015-06-10 ENCOUNTER — Other Ambulatory Visit (HOSPITAL_COMMUNITY): Payer: Self-pay | Admitting: *Deleted

## 2015-06-10 ENCOUNTER — Other Ambulatory Visit (HOSPITAL_COMMUNITY): Payer: Self-pay | Admitting: Vascular Surgery

## 2015-06-10 MED ORDER — POTASSIUM CHLORIDE CRYS ER 20 MEQ PO TBCR: 40.0000 meq | EXTENDED_RELEASE_TABLET | Freq: Two times a day (BID) | ORAL | Status: DC

## 2015-06-17 ENCOUNTER — Other Ambulatory Visit (HOSPITAL_COMMUNITY): Payer: Self-pay | Admitting: Internal Medicine

## 2015-06-21 ENCOUNTER — Telehealth (HOSPITAL_COMMUNITY): Payer: Self-pay

## 2015-06-21 MED ORDER — COLLAGENASE 250 UNIT/GM EX OINT: 1.0000 "application " | TOPICAL_OINTMENT | Freq: Every day | CUTANEOUS | Status: DC

## 2015-06-21 NOTE — Telephone Encounter (Signed)
[facility] RN called to report patient had wound on left heal with eschar requiring an order for santyl ointment. Rx sent to preferred pharmacy electronically.  Ave Filter

## 2015-06-22 ENCOUNTER — Telehealth (HOSPITAL_COMMUNITY): Payer: Self-pay | Admitting: *Deleted

## 2015-06-22 MED ORDER — COLLAGENASE 250 UNIT/GM EX OINT: 1.0000 "application " | TOPICAL_OINTMENT | Freq: Every day | CUTANEOUS | Status: DC

## 2015-06-22 NOTE — Telephone Encounter (Signed)
HHRN called to report pt has wound on his R heal and she would like an order for Santyl dressings changed daily, ok, order sent in

## 2015-06-27 ENCOUNTER — Other Ambulatory Visit (HOSPITAL_COMMUNITY): Payer: Self-pay | Admitting: Internal Medicine

## 2015-06-28 ENCOUNTER — Telehealth (HOSPITAL_COMMUNITY): Payer: Self-pay

## 2015-06-28 NOTE — Telephone Encounter (Signed)
Gary Gallegos with [facility] called to see if we could change therapy for patient's L heal wound. Currently doing santyl ointment to dry eschar site, however, too expensive for patient to continue. Advised can do iodine paint once daily to area and leave open to air per Dr. Shirlee Latch. Aware and agreeable to plan.  RN will update [place] if this is working or note to heal wound.  Ave Filter

## 2015-07-01 ENCOUNTER — Other Ambulatory Visit (HOSPITAL_COMMUNITY): Payer: Self-pay | Admitting: Internal Medicine

## 2015-07-01 ENCOUNTER — Telehealth (HOSPITAL_COMMUNITY): Payer: Self-pay | Admitting: Cardiology

## 2015-07-01 NOTE — Telephone Encounter (Signed)
ABNORMAL LABS RECEIVED FROM [facility] LABS DRAWN 06/29/15 K 3.6 PATIENT ORDERED TO TAKE 40 MEQ TID  PATIENTS DAUGHTER REPORTS HE IS ONLY TAKING 20 BID  PER VO ANDY TILLERY, PA PATIENT SHOULD INCREASE TO 40 BID AND RECHECK WITH HH X 1 WEEK  DAUGHTER AWARE AND VOICED UNDERSTANDING

## 2015-07-08 ENCOUNTER — Other Ambulatory Visit (HOSPITAL_COMMUNITY): Payer: Self-pay | Admitting: Internal Medicine

## 2015-07-11 ENCOUNTER — Telehealth (HOSPITAL_COMMUNITY): Payer: Self-pay | Admitting: Cardiology

## 2015-07-11 NOTE — Telephone Encounter (Signed)
Abnormal labs drawn with [facility] on 07/05/15  k 2.9  Per Amy Clegg,NP Pt should take additional 60 MEQ today  07/06/15 [facility]  07/11/15 pts daughter aware. Pt currently taking 40 meq BID Pt has already taken 40 this AM, give 60 now, and 40 at bedtime

## 2015-07-13 ENCOUNTER — Telehealth (HOSPITAL_COMMUNITY): Payer: Self-pay | Admitting: *Deleted

## 2015-07-13 NOTE — Telephone Encounter (Signed)
Left HHRN verbal orders on her confidential voicemail to continue plan of care with pt.

## 2015-07-15 ENCOUNTER — Telehealth (HOSPITAL_COMMUNITY): Payer: Self-pay | Admitting: Cardiology

## 2015-07-15 ENCOUNTER — Other Ambulatory Visit (HOSPITAL_COMMUNITY): Payer: Self-pay | Admitting: Internal Medicine

## 2015-07-15 DIAGNOSIS — I5022 Chronic systolic (congestive) heart failure: Secondary | ICD-10-CM

## 2015-07-15 MED ORDER — CARVEDILOL 3.125 MG PO TABS: ORAL_TABLET | ORAL | Status: DC

## 2015-07-15 MED ORDER — TORSEMIDE 20 MG PO TABS: ORAL_TABLET | ORAL | Status: DC

## 2015-07-15 MED ORDER — MAGNESIUM 200 MG PO TABS: 200.0000 mg | ORAL_TABLET | Freq: Two times a day (BID) | ORAL | Status: DC

## 2015-07-15 NOTE — Telephone Encounter (Signed)
Abnormal labs received from [facility] k 3.7  magnesium 1.8  Per Joanell Rising Take an additional 20 meq of potassium today  Increase mag to 200 mg bid  Faith/patients daughter aware Also requested refills on coreg and torsemide

## 2015-07-19 ENCOUNTER — Encounter: Payer: Self-pay | Admitting: Licensed Clinical Social Worker

## 2015-07-19 ENCOUNTER — Telehealth (HOSPITAL_COMMUNITY): Payer: Self-pay

## 2015-07-19 ENCOUNTER — Ambulatory Visit (HOSPITAL_COMMUNITY)
Admission: RE | Admit: 2015-07-19 | Discharge: 2015-07-19 | Disposition: A | Discharge status: Home / Self Care | Payer: Medicare Other | Source: Ambulatory Visit | Attending: Internal Medicine | Admitting: Internal Medicine

## 2015-07-19 VITALS — BP 80/60 | HR 104 | Wt 137.0 lb

## 2015-07-19 DIAGNOSIS — F329 Major depressive disorder, single episode, unspecified: Secondary | ICD-10-CM | POA: Insufficient documentation

## 2015-07-19 DIAGNOSIS — R000 Tachycardia, unspecified: Secondary | ICD-10-CM | POA: Insufficient documentation

## 2015-07-19 DIAGNOSIS — E785 Hyperlipidemia, unspecified: Secondary | ICD-10-CM | POA: Insufficient documentation

## 2015-07-19 DIAGNOSIS — Z7982 Long term (current) use of aspirin: Secondary | ICD-10-CM | POA: Insufficient documentation

## 2015-07-19 DIAGNOSIS — I5022 Chronic systolic (congestive) heart failure: Secondary | ICD-10-CM

## 2015-07-19 DIAGNOSIS — Z794 Long term (current) use of insulin: Secondary | ICD-10-CM | POA: Diagnosis not present

## 2015-07-19 DIAGNOSIS — K219 Gastro-esophageal reflux disease without esophagitis: Secondary | ICD-10-CM | POA: Diagnosis not present

## 2015-07-19 DIAGNOSIS — I428 Other cardiomyopathies: Secondary | ICD-10-CM | POA: Diagnosis not present

## 2015-07-19 DIAGNOSIS — Z79899 Other long term (current) drug therapy: Secondary | ICD-10-CM | POA: Diagnosis not present

## 2015-07-19 DIAGNOSIS — I110 Hypertensive heart disease with heart failure: Secondary | ICD-10-CM | POA: Insufficient documentation

## 2015-07-19 DIAGNOSIS — H5442 Blindness, left eye, normal vision right eye: Secondary | ICD-10-CM | POA: Diagnosis not present

## 2015-07-19 DIAGNOSIS — Z95810 Presence of automatic (implantable) cardiac defibrillator: Secondary | ICD-10-CM | POA: Diagnosis not present

## 2015-07-19 DIAGNOSIS — Z66 Do not resuscitate: Secondary | ICD-10-CM | POA: Diagnosis not present

## 2015-07-19 DIAGNOSIS — E11319 Type 2 diabetes mellitus with unspecified diabetic retinopathy without macular edema: Secondary | ICD-10-CM | POA: Diagnosis not present

## 2015-07-19 NOTE — Telephone Encounter (Signed)
Verbal order given to [facility], home health nurse to re certify from Dr. [name]

## 2015-07-19 NOTE — Progress Notes (Signed)
Patient ID: Gary Gallegos, male   DOB: 12-13-1945, 70 y.o.   MRN: [id number]   PCP: Dr. Worthy Rancher, FNP Primary Cardiologist: Dr. Tobias Alexander Primary HF Cardiologist: Dr Shirlee Latch.   HPI: Gary Gallegos is a 70 yo male referred to the HF clinic by Dr.Nelson. Gary Gallegos has a history of DM2 with severe diabetic retinopathy legally blind September , cataracts HTN, HLD, chronic systolic HF due to severe, EF 16% (11/14) s/p ICD Medtronic.   Started having problems with Gary Gallegos heart in early 2013. Was followed by cardiologist at [facility]. Had cath in 2013 and arteries were "fine".  Admitted to [facility] in January with N/V/D. EGD  Showed severe gastritis and esophagitis. Started on Milrinone 0.25 mcg via PICC. Discharge weight 133 pounds. Followed by [facility].   Admitted 8/2 through 8/6 with volume overload. Diuresed with IV lasix and milrinone was increased to 0.375 mcg. Transitioned to torsemide 80 mg daily. Discharge weight was 137 pounds.   Gary Gallegos presents today for HF follow up with Gary Gallegos daughter.  Overall feeling ok. Wants PCP in the [place] area. Denies SOB/PND/Orthopnea. Weight at home 130-135 pounds. Has not required extra diuretics. Appetite ok. Taking all medications. Daughter provides all medications. Has [facility] following for home milrinone.   ECHO 05/05/13: EF 10%, diff HK, grade II DD, mild MR, LA mod dilated, RV mildly dilated and sys fx mod reduced, mod TR ECHO 11/04/13  EF 10% mod-severe MR RV mod reduced. TAPSE 1.6  ECHO 01/2015 EF 10-15%   Labs 07/15/13 K 5.7 Creatinine 1.95 instructed to cut lisinopril 5 mg daily.  Labs 07/22/13 K 3.6 Creatinine 0.81 Dig 1.9 digoxin stopped Labs  08/17/13 K 3.7 Creatinine 0.8 Glucose 190           08/20/13: K+ 3.7, Creatinine 0.70, Glucose 123, Na 137           10/22/13: K 3.9, creatinine 0.69, glucose 179           02/11/14: K 4.3 cr 1.2           07/05/2014: K 5.4 Creatinine 0.96 potassium cut back to 20 meq once a day.            07/12/2014: K 4.4 Creatinine 0.95            11/09/2014: K 3.6 Creatinine 1.04             6/16: K 3.9, creatinine 1.01, hgb 11.9             12/29/14: K 3.9 Creatinine 1.19              01/22/2015: K 4.4 Creatinine 0.89              05/10/2015: K 4.3 Creatinine 1.04   SH: Lives with Gary Gallegos daughter, son in law, and 2 granddaughters.     ROS: All systems negative except as listed in HPI, PMH and Problem List.  Past Medical History  Diagnosis Date  . Biventricular CHF (congestive heart failure)     a. Echo 8/16:  EF 10-15%, diff HK, trivial AI, mild MR, mild LAE, mod RVE, mild reduced RVF, mild TR, PASP 40 mmHg, mild eff without tamponade.   . Hypertension   . Impaired vision in both eyes   . AICD (automatic cardioverter/defibrillator) present   . Hyperlipidemia   . Type II diabetes mellitus   . Depression   . GERD (gastroesophageal reflux disease)   . Blind left eye   . NICM (nonischemic cardiomyopathy)  Current Outpatient Prescriptions  Medication Sig Dispense Refill  . aspirin EC 81 MG tablet Take 81 mg by mouth daily.    . carvedilol (COREG) 3.125 MG tablet TAKE 1 TABLET BY MOUTH TWO TIMES DAILY WITH A MEAL 60 tablet 3  . collagenase (SANTYL) ointment Apply 1 application topically daily. Apply to affected wound site once daily with clean dry gauze dressing. 15 g 0  . insulin glargine (LANTUS) 100 UNIT/ML injection Inject 0.05 mLs (5 Units total) into the skin at bedtime. 10 mL 0  . Magnesium 200 MG TABS Take 1 tablet (200 mg total) by mouth 2 (two) times daily. 60 each 2  . Melatonin 10 MG TABS Take 10 mg by mouth at bedtime.    . metolazone (ZAROXOLYN) 5 MG tablet Take 1 tablet (5 mg total) by mouth as needed (for increased edema). 30 tablet 3  . milrinone (PRIMACOR) 20 MG/100ML SOLN infusion Inject 25.875 mcg/min into the vein continuous. 100 mL 999  . pantoprazole (PROTONIX) 20 MG tablet take 1 tablet by mouth once daily 30 tablet 5  . potassium chloride SA (K-DUR,KLOR-CON) 20 MEQ tablet Take 2 tablets (40 mEq  total) by mouth 2 (two) times daily. 120 tablet 3  . sertraline (ZOLOFT) 50 MG tablet Take 50 mg by mouth daily.    . simvastatin (ZOCOR) 5 MG tablet Take 1 tablet (5 mg total) by mouth at bedtime. 30 tablet 6  . torsemide (DEMADEX) 20 MG tablet Take 4 tabs in AM and 2 tabs in PM 180 tablet 3   No current facility-administered medications for this encounter.    Filed Vitals:   07/19/15 1519  BP: 80/60  Pulse: 104  Weight: 137 lb (62.143 kg)  SpO2: 99%    PHYSICAL EXAM: General: Chronically ill appearing. Arrived in wheel chair. Daughter present  HEENT: normal; arcus senilus.  Neck: supple. JVP  5-6 . Carotids 2+ bilaterally; no bruits. No lymphadenopathy or thryomegaly appreciated. Cor: PMI laterally displaced.Tachy regular. No rubs. + S3; +3/6 systolic murmur   Lungs: clear Abdomen:  nontender, nondistended. No hepatosplenomegaly. No bruits or masses. Good bowel sounds. Extremities: no cyanosis, clubbing, rash; RUE picc; R and L ankle edema.   Neuro: alert & orientedx3, cranial nerves grossly intact. Moves all 4 extremities w/o difficulty. Affect pleasant.  ASSESSMENT & PLAN: 1) Chronic biventricular HF: NICM s/p ICD. EF 10% mod-severe MR RV mod reduced. Gary Gallegos is on chronic home milrinone. NYHA class IIIb. Gary Gallegos remains stable on milrinone.  Volume status stable. Continue  torsemide to 80 mg /40 mg . Continue metolazone 5 mg as needed for edema.  - Continue current milrinone. - Continue coreg 3.125 mg BID for now. May need to stop if BP drops.  - No ACEI with soft bp.  -Continue [facility] for home inotropes. - Reinforced the need and importance of daily weights, a low sodium diet, and fluid restriction (less than 2 L a day). Instructed to call the HF clinic if weight increases more than 3 lbs overnight or 5 lbs in a week. 2)  Diabetes with diabetic retinopathy- Referred to [facility] for PCP.   3) Sinus Tachycardia- Not candidate for ivabradine due to shock/milrinone.  4) DNR 5)  Skin /L heel  Bulla- resolved.   Follow up in 6 weeks.   Ladarius Seubert NP-C  3:20 PM

## 2015-07-19 NOTE — Progress Notes (Signed)
CSW referred to assist with obtaining a PCP. Patient reports he would like to remain in [facility] system and has [employer]. CSW contacted Doris at the Internal Medicine clinic and scheduled appointment for tomorrow at 2:45pm. Daughter verbalizes understanding and will escort patient to appointment. CSW available as needed. Lasandra Beech, LCSW [phone]

## 2015-07-19 NOTE — Progress Notes (Signed)
Advanced Heart Failure Medication Review by a Pharmacist  Does the patient  feel that his/her medications are working for him/her?  yes  Has the patient been experiencing any side effects to the medications prescribed?  no  Does the patient measure his/her own blood pressure or blood glucose at home?  yes   Does the patient have any problems obtaining medications due to transportation or finances?   no  Understanding of regimen: good Understanding of indications: good Potential of compliance: good Patient understands to avoid NSAIDs. Patient understands to avoid decongestants.  Issues to address at subsequent visits: None   Pharmacist comments:  Gary Gallegos is a pleasant 70 yo M presenting with his daughter and without a medication list. He reports good compliance with his regimen. His daughter states that he does not have a PCP so has run out of sertraline and protonix and is about to run out of lantus. We will make a referral to a PCP in the area.   Tyler Deis. Bonnye Fava, PharmD, BCPS, CPP Clinical Pharmacist Pager: [phone] Phone: [phone] 07/19/2015 3:52 PM      Time with patient: 8 minutes Preparation and documentation time: 2 minutes Total time: 10 minutes

## 2015-07-20 ENCOUNTER — Encounter: Payer: Self-pay | Admitting: Internal Medicine

## 2015-07-20 ENCOUNTER — Ambulatory Visit (INDEPENDENT_AMBULATORY_CARE_PROVIDER_SITE_OTHER): Payer: Medicare Other | Admitting: Internal Medicine

## 2015-07-20 VITALS — BP 111/66 | HR 51 | Temp 97.9°F | Ht 68.0 in | Wt 130.0 lb

## 2015-07-20 DIAGNOSIS — F411 Generalized anxiety disorder: Secondary | ICD-10-CM | POA: Diagnosis not present

## 2015-07-20 DIAGNOSIS — E11319 Type 2 diabetes mellitus with unspecified diabetic retinopathy without macular edema: Secondary | ICD-10-CM

## 2015-07-20 DIAGNOSIS — L97429 Non-pressure chronic ulcer of left heel and midfoot with unspecified severity: Secondary | ICD-10-CM

## 2015-07-20 DIAGNOSIS — E1165 Type 2 diabetes mellitus with hyperglycemia: Secondary | ICD-10-CM | POA: Diagnosis not present

## 2015-07-20 DIAGNOSIS — Z79899 Other long term (current) drug therapy: Secondary | ICD-10-CM

## 2015-07-20 DIAGNOSIS — K7460 Unspecified cirrhosis of liver: Secondary | ICD-10-CM

## 2015-07-20 DIAGNOSIS — Z794 Long term (current) use of insulin: Secondary | ICD-10-CM

## 2015-07-20 DIAGNOSIS — H6123 Impacted cerumen, bilateral: Secondary | ICD-10-CM

## 2015-07-20 DIAGNOSIS — L97509 Non-pressure chronic ulcer of other part of unspecified foot with unspecified severity: Secondary | ICD-10-CM

## 2015-07-20 DIAGNOSIS — E11621 Type 2 diabetes mellitus with foot ulcer: Secondary | ICD-10-CM

## 2015-07-20 DIAGNOSIS — R160 Hepatomegaly, not elsewhere classified: Secondary | ICD-10-CM

## 2015-07-20 DIAGNOSIS — Z66 Do not resuscitate: Secondary | ICD-10-CM

## 2015-07-20 DIAGNOSIS — L97529 Non-pressure chronic ulcer of other part of left foot with unspecified severity: Secondary | ICD-10-CM

## 2015-07-20 DIAGNOSIS — I5022 Chronic systolic (congestive) heart failure: Secondary | ICD-10-CM

## 2015-07-20 LAB — PROTIME-INR
INR: 1.23 (ref 0.00–1.49)
Prothrombin Time: 15.7 seconds — ABNORMAL HIGH (ref 11.6–15.2)

## 2015-07-20 LAB — GLUCOSE, CAPILLARY: Glucose-Capillary: 162 mg/dL — ABNORMAL HIGH (ref 65–99)

## 2015-07-20 LAB — POCT GLYCOSYLATED HEMOGLOBIN (HGB A1C): Hemoglobin A1C: 8.7

## 2015-07-20 MED ORDER — INSULIN GLARGINE 100 UNIT/ML SOLOSTAR PEN: 5.0000 [IU] | PEN_INJECTOR | Freq: Every day | SUBCUTANEOUS | Status: DC

## 2015-07-20 MED ORDER — MIRTAZAPINE 15 MG PO TABS
7.5000 mg | ORAL_TABLET | Freq: Every day | ORAL | Status: DC
Start: 2015-07-20 — End: 2016-02-02

## 2015-07-20 MED ORDER — SERTRALINE HCL 50 MG PO TABS
50.0000 mg | ORAL_TABLET | Freq: Every day | ORAL | Status: DC
Start: 2015-07-20 — End: 2016-01-24

## 2015-07-20 MED ORDER — PANTOPRAZOLE SODIUM 20 MG PO TBEC
20.0000 mg | DELAYED_RELEASE_TABLET | Freq: Every day | ORAL | Status: DC
Start: 2015-07-20 — End: 2016-01-24

## 2015-07-20 MED ORDER — INSULIN PEN NEEDLE 31G X 4 MM MISC: 1.0000 [IU] | Freq: Every day | Status: DC

## 2015-07-20 NOTE — Assessment & Plan Note (Signed)
HPI: Takes sertraline for anxiety, daughter reports that it helps.  She also request to restart remeron 7.5mg  to help with insomnia   A: Generalized anxiety disorder  P: Continue sertraline 50mg  daily -Remeron 7.5mg  QHSPRN for insomnia.

## 2015-07-20 NOTE — Patient Instructions (Signed)
General Instructions:   Please bring your medicines with you each time you come to clinic.  Medicines may include prescription medications, over-the-counter medications, herbal remedies, eye drops, vitamins, or other pills.   Progress Toward Treatment Goals:  No flowsheet data found.  Self Care Goals & Plans:  No flowsheet data found.  No flowsheet data found.   Care Management & Community Referrals:  No flowsheet data found.      

## 2015-07-20 NOTE — Progress Notes (Signed)
[facility] Subjective:   Patient ID: Gary Gallegos male   DOB: 08-Feb-1946 70 y.o.   MRN: [id number]  HPI: GaryGary Gallegos is a 70 y.o. male with a PMH detailed below who presents with his daughter to establish care with a new PCP.  He was last seen by a PCP almost 2 years ago Teacher, English as a foreign language).  They live in [place] now but they want to continue care in [place] due to the outstanding care they have received from the heart failure team.  Please see problem based charting below for the status of his chronic medical problems  Past Medical History  Diagnosis Date  . Biventricular CHF (congestive heart failure) (HCC)     a. Echo 8/16:  EF 10-15%, diff HK, trivial AI, mild MR, mild LAE, mod RVE, mild reduced RVF, mild TR, PASP 40 mmHg, mild eff without tamponade.   . Hypertension   . Impaired vision in both eyes   . AICD (automatic cardioverter/defibrillator) present   . Hyperlipidemia   . Type II diabetes mellitus (HCC)   . Depression   . GERD (gastroesophageal reflux disease)   . Blind left eye   . NICM (nonischemic cardiomyopathy) (HCC)    Current Outpatient Prescriptions  Medication Sig Dispense Refill  . aspirin EC 81 MG tablet Take 81 mg by mouth daily.    . carvedilol (COREG) 3.125 MG tablet TAKE 1 TABLET BY MOUTH TWO TIMES DAILY WITH A MEAL 60 tablet 3  . collagenase (SANTYL) ointment Apply 1 application topically daily. Apply to affected wound site once daily with clean dry gauze dressing. 15 g 0  . Insulin Glargine (LANTUS) 100 UNIT/ML Solostar Pen Inject 5 Units into the skin daily at 10 pm. 15 mL 11  . Insulin Pen Needle 31G X 4 MM MISC 1 Units by Does not apply route daily. 100 each 3  . Magnesium 200 MG TABS Take 200 mg by mouth daily.    . Melatonin 10 MG TABS Take 10 mg by mouth at bedtime.    . metolazone (ZAROXOLYN) 5 MG tablet Take 5 mg by mouth once a week. Wednesday    . milrinone (PRIMACOR) 20 MG/100ML SOLN infusion Inject 25.875 mcg/min into the vein  continuous. 100 mL 999  . mirtazapine (REMERON) 15 MG tablet Take 0.5 tablets (7.5 mg total) by mouth at bedtime. 45 tablet 1  . pantoprazole (PROTONIX) 20 MG tablet Take 1 tablet (20 mg total) by mouth daily. 90 tablet 3  . potassium chloride SA (K-DUR,KLOR-CON) 20 MEQ tablet Take 2 tablets (40 mEq total) by mouth 2 (two) times daily. 120 tablet 3  . sertraline (ZOLOFT) 50 MG tablet Take 1 tablet (50 mg total) by mouth daily. 90 tablet 3  . simvastatin (ZOCOR) 5 MG tablet Take 1 tablet (5 mg total) by mouth at bedtime. 30 tablet 6  . torsemide (DEMADEX) 20 MG tablet Take 4 tabs in AM and 2 tabs in PM 180 tablet 3   No current facility-administered medications for this visit.   Family History  Problem Relation Age of Onset  . Heart disease Father    Social History   Social History  . Marital Status: Widowed    Spouse Name: N/A  . Number of Children: N/A  . Years of Education: N/A   Social History Main Topics  . Smoking status: Former Smoker -- 0.50 packs/day for 1 years    Types: Cigarettes  . Smokeless tobacco: Never Used     Comment: "stopped  smoking in the 1970's"  . Alcohol Use: No  . Drug Use: No  . Sexual Activity: No   Other Topics Concern  . None   Social History Narrative   Review of Systems: Review of Systems  Constitutional: Negative for fever and weight loss.  Eyes: Positive for blurred vision.  Respiratory: Negative for cough.   Cardiovascular: Negative for chest pain and leg swelling.  Gastrointestinal: Negative for blood in stool and melena.  Neurological: Negative for headaches.  All other systems reviewed and are negative.   Objective:  Physical Exam: Filed Vitals:   07/20/15 1516  BP: 111/66  Pulse: 51  Temp: 97.9 F (36.6 C)  TempSrc: Oral  Height:  (1.727 m)  Weight: 130 lb (58.968 kg)  SpO2: 100%  Physical Exam  Constitutional: He is well-developed, well-nourished, and in no distress.  HENT:  Head: Normocephalic and atraumatic.   Cardiovascular: Normal rate and regular rhythm.   Murmur (3/6 systolic) heard. Pulses:      Dorsalis pedis pulses are 2+ on the right side, and 2+ on the left side.       Posterior tibial pulses are 2+ on the right side, and 2+ on the left side.  Pulmonary/Chest: Effort normal and breath sounds normal. No respiratory distress. He has no rales.  Abdominal: Soft. Bowel sounds are normal. He exhibits no distension. There is no tenderness.  Musculoskeletal: He exhibits no edema.  Skin: Skin is warm and dry.  Ulcer of posterior left foot over achillis insertion, dry, non draining about 1cm in circumference   Psychiatric: He has a flat affect.  Nursing note and vitals reviewed.   Assessment & Plan:  Case discussed with Dr. Oswaldo Done  Chronic systolic heart failure HPI: He follow with advance heart failure, on maximum tolerated therapy and has a milrinone infusion.  Last EF 10%.  A: Chronic systolic heart failure  P: Continue therapy per heart failure.  Cirrhosis HPI: About 2 years ago he was admitted with CHF exacerbation, during that admission he had a CT scan for ascites which found an enlarged liver, nodular contour and a left lobe lesion.  He decided to go into hospice at that time and forewent further workup of this cirrhosis.  Mr Gallegos and his daughter have been concerned about this diagnosis and want to know if further workup can be prursed.   His abdomin dose swell when he gets volume overloaded.  He does not have any confusion.  A: Possible Cirrhosis  P: His recent labs are not consistent with advanced cirrhosis.  I discussed further workup with imaging (MRI) but that positive results would then lead to a biopsy which I am not sure he could tolerate and possible surgery which he would tolerate.  I informed that more important clinically significant issue may just be testing his syntheic function of his liver with repeat blood tests and they wanted to pursue this option. - CBC -  CMP - INR  Type 2 diabetes mellitus, uncontrolled (HCC) HPI: His daughter has been managing his blood sugars, she gives him 5 units of lantus QHS.  She did not bring the glucometer today but reports blood sugars 160- 200s range, highest 320s.  Overall she thinks he is doing OK but is concerned about what his A1c will be.  A: Type 2 DM uncontrolled with diabetic retinopathy  P: We discussed his DM regimen and goals of care and we will change his goal A1c to <9 given his advanced complications and  short life expectancy.  Impacted cerumen of both ears HPI: requested his ears be checked  A: Impacted cerumen of both ears  P: attempted lavage per nursing.  Liver mass, left lobe - We discussed the possibility of repeat imaging but he would be a poor candidate for intervention and they declined repeat imaging.  DNR (do not resuscitate) -I discussed that he had previous notes of a DNR.  Patient and daughter report they made that decision when he was going into hospice, now they are more unsure.  We discussed the nature of CPR as well as typical outcomes and the nature of his cardiomyopathy would likely mean he would not be able to reach an independent level of living again. They agree with continued DNR status.  Generalized anxiety disorder HPI: Takes sertraline for anxiety, daughter reports that it helps.  She also request to restart remeron 7.5mg  to help with insomnia   A: Generalized anxiety disorder  P: Continue sertraline 50mg  daily -Remeron 7.5mg  QHSPRN for insomnia.  Diabetic foot ulcer (HCC) HPI: He had a skin bulla at his heal about 6 weeks ago which he had lanced at cardiology.  He has had wound care at home with dressings.  Daughter reports that ulcer is healing slowly but now is dry without drainage.  A: Diabetic foot ulcer  P: He likely will need sharp debridement of the ulcer edges,  I will refer over to podiatry.    Medications Ordered Meds ordered this encounter   Medications  . Insulin Glargine (LANTUS) 100 UNIT/ML Solostar Pen    Sig: Inject 5 Units into the skin daily at 10 pm.    Dispense:  15 mL    Refill:  11  . Insulin Pen Needle 31G X 4 MM MISC    Sig: 1 Units by Does not apply route daily.    Dispense:  100 each    Refill:  3  . mirtazapine (REMERON) 15 MG tablet    Sig: Take 0.5 tablets (7.5 mg total) by mouth at bedtime.    Dispense:  45 tablet    Refill:  1  . pantoprazole (PROTONIX) 20 MG tablet    Sig: Take 1 tablet (20 mg total) by mouth daily.    Dispense:  90 tablet    Refill:  3  . sertraline (ZOLOFT) 50 MG tablet    Sig: Take 1 tablet (50 mg total) by mouth daily.    Dispense:  90 tablet    Refill:  3   Other Orders Orders Placed This Encounter  Procedures  . Glucose, capillary  . Protime-INR  . CMP14 + Anion Gap  . CBC with Diff  . Ambulatory referral to Podiatry    Referral Priority:  Routine    Referral Type:  Consultation    Referral Reason:  Specialty Services Required    Requested Specialty:  Podiatry    Number of Visits Requested:  1  . POC Hbg A1C   Follow Up: Return 3-6 months.

## 2015-07-20 NOTE — Assessment & Plan Note (Signed)
-   We discussed the possibility of repeat imaging but he would be a poor candidate for intervention and they declined repeat imaging.

## 2015-07-20 NOTE — Assessment & Plan Note (Signed)
-  I discussed that he had previous notes of a DNR.  Patient and daughter report they made that decision when he was going into hospice, now they are more unsure.  We discussed the nature of CPR as well as typical outcomes and the nature of his cardiomyopathy would likely mean he would not be able to reach an independent level of living again. They agree with continued DNR status.

## 2015-07-20 NOTE — Assessment & Plan Note (Signed)
HPI: He had a skin bulla at his heal about 6 weeks ago which he had lanced at cardiology.  He has had wound care at home with dressings.  Daughter reports that ulcer is healing slowly but now is dry without drainage.  A: Diabetic foot ulcer  P: He likely will need sharp debridement of the ulcer edges,  I will refer over to podiatry.

## 2015-07-20 NOTE — Assessment & Plan Note (Signed)
HPI: His daughter has been managing his blood sugars, she gives him 5 units of lantus QHS.  She did not bring the glucometer today but reports blood sugars 160- 200s range, highest 320s.  Overall she thinks he is doing OK but is concerned about what his A1c will be.  A: Type 2 DM uncontrolled with diabetic retinopathy  P: We discussed his DM regimen and goals of care and we will change his goal A1c to <9 given his advanced complications and short life expectancy.

## 2015-07-20 NOTE — Assessment & Plan Note (Signed)
HPI: He follow with advance heart failure, on maximum tolerated therapy and has a milrinone infusion.  Last EF 10%.  A: Chronic systolic heart failure  P: Continue therapy per heart failure.

## 2015-07-20 NOTE — Assessment & Plan Note (Signed)
HPI: About 2 years ago he was admitted with CHF exacerbation, during that admission he had a CT scan for ascites which found an enlarged liver, nodular contour and a left lobe lesion.  He decided to go into hospice at that time and forewent further workup of this cirrhosis.  Gary Gallegos and his daughter have been concerned about this diagnosis and want to know if further workup can be prursed.   His abdomin dose swell when he gets volume overloaded.  He does not have any confusion.  A: Possible Cirrhosis  P: His recent labs are not consistent with advanced cirrhosis.  I discussed further workup with imaging (MRI) but that positive results would then lead to a biopsy which I am not sure he could tolerate and possible surgery which he would tolerate.  I informed that more important clinically significant issue may just be testing his syntheic function of his liver with repeat blood tests and they wanted to pursue this option. - CBC - CMP - INR

## 2015-07-20 NOTE — Assessment & Plan Note (Signed)
HPI: requested his ears be checked  A: Impacted cerumen of both ears  P: attempted lavage per nursing.

## 2015-07-21 ENCOUNTER — Encounter: Payer: Self-pay | Admitting: Internal Medicine

## 2015-07-21 LAB — CMP14 + ANION GAP
A/G RATIO: 1.4 (ref 1.1–2.5)
ALK PHOS: 89 IU/L (ref 39–117)
ALT: 9 IU/L (ref 0–44)
AST: 18 IU/L (ref 0–40)
Albumin: 4.8 g/dL (ref 3.6–4.8)
Anion Gap: 20 mmol/L — ABNORMAL HIGH (ref 10.0–18.0)
BUN/Creatinine Ratio: 35 — ABNORMAL HIGH (ref 10–22)
BUN: 43 mg/dL — ABNORMAL HIGH (ref 8–27)
Bilirubin Total: 1.5 mg/dL — ABNORMAL HIGH (ref 0.0–1.2)
CO2: 29 mmol/L (ref 18–29)
Calcium: 10.4 mg/dL — ABNORMAL HIGH (ref 8.6–10.2)
Chloride: 91 mmol/L — ABNORMAL LOW (ref 96–106)
Creatinine, Ser: 1.22 mg/dL (ref 0.76–1.27)
GFR calc Af Amer: 70 mL/min/{1.73_m2} (ref >59)
GFR calc non Af Amer: 61 mL/min/{1.73_m2} (ref >59)
GLOBULIN, TOTAL: 3.4 g/dL (ref 1.5–4.5)
Glucose: 164 mg/dL — ABNORMAL HIGH (ref 65–99)
POTASSIUM: 3.5 mmol/L (ref 3.5–5.2)
SODIUM: 140 mmol/L (ref 134–144)
Total Protein: 8.2 g/dL (ref 6.0–8.5)

## 2015-07-21 LAB — CBC WITH DIFFERENTIAL/PLATELET
BASOS: 1 %
Basophils Absolute: 0 10*3/uL (ref 0.0–0.2)
EOS (ABSOLUTE): 0.2 10*3/uL (ref 0.0–0.4)
Eos: 3 %
Hematocrit: 46.9 % (ref 37.5–51.0)
Hemoglobin: 15.4 g/dL (ref 12.6–17.7)
IMMATURE GRANULOCYTES: 0 %
Immature Grans (Abs): 0 10*3/uL (ref 0.0–0.1)
LYMPHS ABS: 1.1 10*3/uL (ref 0.7–3.1)
Lymphs: 18 %
MCH: 27.4 pg (ref 26.6–33.0)
MCHC: 32.8 g/dL (ref 31.5–35.7)
MCV: 83 fL (ref 79–97)
MONOS ABS: 0.7 10*3/uL (ref 0.1–0.9)
Monocytes: 11 %
NEUTROS PCT: 67 %
Neutrophils Absolute: 4.3 10*3/uL (ref 1.4–7.0)
PLATELETS: 239 10*3/uL (ref 150–379)
RBC: 5.63 x10E6/uL (ref 4.14–5.80)
RDW: 20.6 % — AB (ref 12.3–15.4)
WBC: 6.4 10*3/uL (ref 3.4–10.8)

## 2015-07-22 ENCOUNTER — Other Ambulatory Visit (HOSPITAL_COMMUNITY): Payer: Self-pay | Admitting: Internal Medicine

## 2015-07-22 NOTE — Progress Notes (Signed)
Internal Medicine Clinic Attending  Case discussed with Dr. Hoffman at the time of the visit.  We reviewed the resident's history and exam and pertinent patient test results.  I agree with the assessment, diagnosis, and plan of care documented in the resident's note.  

## 2015-07-26 ENCOUNTER — Telehealth (HOSPITAL_COMMUNITY): Payer: Self-pay

## 2015-07-26 NOTE — Telephone Encounter (Signed)
VO given to [facility] RN for L heal ulcer hydrocolloid dressing change BIW and PRN per Dr. [name].  Ave Filter

## 2015-07-29 ENCOUNTER — Other Ambulatory Visit (HOSPITAL_COMMUNITY): Payer: Self-pay | Admitting: Vascular Surgery

## 2015-08-03 ENCOUNTER — Other Ambulatory Visit: Payer: Self-pay | Admitting: *Deleted

## 2015-08-03 NOTE — Patient Outreach (Signed)
[facility]) Care Management  08/03/2015  Gary Gallegos [birth year] [id number]  Subjective:  Telephone call to patient's home number, spoke with daughter Gary Gallegos, states patient is currently sleeping and she is patient's POA (Power of [place]).  Advised daughter RNCM would not be able to disclose nature of call without patient's authorization.  Left HIPAA compliant message with patient's daughter for patient,  requesting call back.  Objective:  Per Epic case review, patient currently being followed by the heart failure clinic.  Assessment: Received [employer] Tier 4 list referral on 07/28/15.    Referral states: information letter, magnet, and brochure was mailed from [facility] Care Management to the patient.   2 Admissions, 2 ED visits, and 22 Home Health claims.   Diagnosis: Congestive Heart Failure and COPD.  Need to confirm patient's primary MD whether Dr. Worthy Rancher or Dr. Sheliah Hatch.   Telephone screening pending patient contact.   Plan:  RNCM will call patient for 2nd attempt telephone outreach within 3 business days, if no return call.  Janes Colegrove H. Gardiner Barefoot, BSN, CCM [facility] Care Management [facility] Telephonic CM Phone: [phone] Fax: [phone]

## 2015-08-04 ENCOUNTER — Other Ambulatory Visit: Payer: Self-pay | Admitting: *Deleted

## 2015-08-04 NOTE — Patient Outreach (Signed)
[facility]) Care Management  08/04/2015  Gary Gallegos 1945-11-04 [id number]  Subjective: Telephone call to patient's home number, no answer, left HIPAA compliant voicemail message and requested call back.  Objective: Per Epic case review, patient currently being followed by the heart failure clinic.  Assessment: Received [employer] Tier 4 list referral on 07/28/15. Referral states: information letter, magnet, and brochure was mailed from [facility] Care Management to the patient. 2 Admissions, 2 ED visits, and 22 Home Health claims. Diagnosis: Congestive Heart Failure and COPD. Need to confirm patient's primary MD whether Dr. Worthy Rancher or Dr. Sheliah Hatch. Telephone screening completion pending patient contact.   Plan: RNCM will call patient for 3rd attempt telephone outreach within 3 business days, if no return call.  Gary Gallegos, BSN, CCM [facility] Care Management [facility] Telephonic CM Phone: [phone]

## 2015-08-08 ENCOUNTER — Telehealth: Payer: Self-pay | Admitting: Internal Medicine

## 2015-08-08 ENCOUNTER — Other Ambulatory Visit: Payer: Self-pay | Admitting: *Deleted

## 2015-08-08 ENCOUNTER — Encounter: Payer: Self-pay | Admitting: *Deleted

## 2015-08-08 NOTE — Telephone Encounter (Signed)
Calling to let us know that she has tried to reach the patient to get him enrolled with their services has left 3 messages and a letter with no response.

## 2015-08-08 NOTE — Patient Outreach (Signed)
[facility]) Care Management  08/08/2015  Hyatt Eduardo 11/02/1945 [id number]  Subjective: Telephone call to patient's home number, no answer, left HIPAA compliant voicemail message and requested call back. Telephone call to Tiffany at Dr. Cristal Deer Rice's office, HIPAA verified and advised of RNCM's patient outreach attempts.  States she will pass information to Dr. Dimple Casey and RNCM's request for [facility] engagement assistance, if MD feels appropriate.    Objective: Per Epic case review, patient currently being followed by the heart failure clinic.  Assessment: Received Next[employer] Tier 4 list referral on 07/28/15. Referral states: information letter, magnet, and brochure was mailed from [facility] Care Management to the patient. 2 Admissions, 2 ED visits, and 22 Home Health claims. Diagnosis: Congestive Heart Failure and COPD. Need to confirm patient's primary MD whether Dr. Worthy Rancher or Dr. Sheliah Hatch. Telephone screening completion pending patient contact.   Plan: RNCM will send patient unsuccessful outreach letter and proceed with case closure if no return call within 10 business days.  Stirling Orton H. Gardiner Barefoot, BSN, CCM [facility] Care Management [facility] Telephonic CM Phone: [phone] Fax: [phone]

## 2015-08-09 ENCOUNTER — Other Ambulatory Visit (HOSPITAL_COMMUNITY): Payer: Self-pay | Admitting: Internal Medicine

## 2015-08-12 ENCOUNTER — Other Ambulatory Visit (HOSPITAL_COMMUNITY): Payer: Self-pay | Admitting: Internal Medicine

## 2015-08-18 ENCOUNTER — Telehealth (HOSPITAL_COMMUNITY): Payer: Self-pay | Admitting: Student

## 2015-08-18 NOTE — Telephone Encounter (Signed)
Concerning labs. No answer on call. LM to call back.   Cr 0.9 -> 1.89 BUN 36 -> 95 Change over 1 week.  Stop metolazone. Hold torsemide and K x 2 days.    Needs BMET Monday in clinic or via [facility].    Casimiro Needle [address]" [place], PA-C 08/18/2015 2:24 PM

## 2015-08-18 NOTE — Telephone Encounter (Signed)
Tried to call pt's daughter, this time VM was full so not able to leave a mess.  I also attempted to call pt's [facility], did not get an answer from her either.  Have sent mess to [facility] to see if they can reach pt or go by his home tomorrow.

## 2015-08-19 NOTE — Telephone Encounter (Signed)
Per [facility] they reached pt last night and advised him/daughter about med changes, they will repeat labs Mon

## 2015-08-22 ENCOUNTER — Encounter: Payer: Self-pay | Admitting: *Deleted

## 2015-08-22 ENCOUNTER — Other Ambulatory Visit: Payer: Self-pay | Admitting: *Deleted

## 2015-08-22 NOTE — Patient Outreach (Signed)
[facility]) Care Management  08/22/2015  Gary Gallegos 1946/02/14 [id number]  No response for patient outreach attempts, will proceed with case closure.  Objective: Per Epic case review, patient currently being followed by the heart failure clinic.  Assessment: Received Next[employer] Tier 4 list referral on 07/28/15. Referral states: information letter, magnet, and brochure was mailed from [facility] Care Management to the patient. 2 Admissions, 2 ED visits, and 22 Home Health claims. Diagnosis: Congestive Heart Failure and COPD. Need to confirm patient's primary MD whether Dr. Worthy Rancher or Dr. Sheliah Hatch. Patient has not responded to outreach attempt, unable to contact.  Plan: RNCM will send patient's primary MD case closure letter due to unable to reach. RNCM will send request for case closure due to unable to reach, to Gary Gallegos at [facility] Care Management.    Gary Gallegos, BSN, CCM [facility] Care Management [facility] Telephonic CM Phone: [phone] Fax: [phone]

## 2015-08-23 ENCOUNTER — Telehealth (HOSPITAL_COMMUNITY): Payer: Self-pay | Admitting: *Deleted

## 2015-08-23 NOTE — Telephone Encounter (Signed)
Left voice message for Gary Gallegos to have pt restart Torsemide 80mg  AM 40mg  PM. (per Mardelle Matte)

## 2015-08-23 NOTE — Telephone Encounter (Signed)
Yasmine with [facility] called and stated patient is feeling ok and looking better since discontinuing Torsemide and Metolazone due to lab work but his weight is up to 143lbs. She drew STAT labs today and asks for a returned call after labs are reviewed call back #[phone]

## 2015-08-26 ENCOUNTER — Other Ambulatory Visit (HOSPITAL_COMMUNITY): Payer: Self-pay | Admitting: Internal Medicine

## 2015-08-30 ENCOUNTER — Other Ambulatory Visit (HOSPITAL_COMMUNITY): Payer: Self-pay | Admitting: Internal Medicine

## 2015-08-30 ENCOUNTER — Ambulatory Visit (HOSPITAL_COMMUNITY)
Admission: RE | Admit: 2015-08-30 | Discharge: 2015-08-30 | Disposition: A | Discharge status: Home / Self Care | Payer: Medicare Other | Source: Ambulatory Visit | Attending: Internal Medicine | Admitting: Internal Medicine

## 2015-08-30 VITALS — BP 96/70 | HR 107 | Wt 147.2 lb

## 2015-08-30 DIAGNOSIS — R000 Tachycardia, unspecified: Secondary | ICD-10-CM | POA: Insufficient documentation

## 2015-08-30 DIAGNOSIS — Z794 Long term (current) use of insulin: Secondary | ICD-10-CM | POA: Diagnosis not present

## 2015-08-30 DIAGNOSIS — I5022 Chronic systolic (congestive) heart failure: Secondary | ICD-10-CM | POA: Diagnosis not present

## 2015-08-30 DIAGNOSIS — I428 Other cardiomyopathies: Secondary | ICD-10-CM | POA: Diagnosis not present

## 2015-08-30 DIAGNOSIS — I509 Heart failure, unspecified: Secondary | ICD-10-CM | POA: Diagnosis present

## 2015-08-30 DIAGNOSIS — E11319 Type 2 diabetes mellitus with unspecified diabetic retinopathy without macular edema: Secondary | ICD-10-CM | POA: Diagnosis not present

## 2015-08-30 DIAGNOSIS — E785 Hyperlipidemia, unspecified: Secondary | ICD-10-CM | POA: Diagnosis not present

## 2015-08-30 DIAGNOSIS — Z7982 Long term (current) use of aspirin: Secondary | ICD-10-CM | POA: Diagnosis not present

## 2015-08-30 DIAGNOSIS — I110 Hypertensive heart disease with heart failure: Secondary | ICD-10-CM | POA: Insufficient documentation

## 2015-08-30 DIAGNOSIS — Z66 Do not resuscitate: Secondary | ICD-10-CM

## 2015-08-30 DIAGNOSIS — K219 Gastro-esophageal reflux disease without esophagitis: Secondary | ICD-10-CM | POA: Insufficient documentation

## 2015-08-30 DIAGNOSIS — H5442 Blindness, left eye, normal vision right eye: Secondary | ICD-10-CM | POA: Diagnosis not present

## 2015-08-30 DIAGNOSIS — F329 Major depressive disorder, single episode, unspecified: Secondary | ICD-10-CM | POA: Insufficient documentation

## 2015-08-30 DIAGNOSIS — Z95810 Presence of automatic (implantable) cardiac defibrillator: Secondary | ICD-10-CM | POA: Diagnosis not present

## 2015-08-30 DIAGNOSIS — Z79899 Other long term (current) drug therapy: Secondary | ICD-10-CM | POA: Insufficient documentation

## 2015-08-30 NOTE — Progress Notes (Signed)
Patient ID: Gary Gallegos, male   DOB: 09-06-1945, 70 y.o.   MRN: [id number]   PCP: Dr. Worthy Rancher, FNP Primary Cardiologist: Dr. Tobias Alexander Primary HF Cardiologist: Dr Shirlee Latch.   HPI: Gary Gallegos is a 70 yo male referred to the HF clinic by Dr.Nelson. He has a history of DM2 with severe diabetic retinopathy legally blind September , cataracts HTN, HLD, chronic systolic HF due to severe, EF 78% (11/14) s/p ICD Medtronic.   Started having problems with his heart in early 2013. Was followed by cardiologist at [facility]. Had cath in 2013 and arteries were "fine".  Admitted to [facility] in January with N/V/D. EGD  Showed severe gastritis and esophagitis. Started on Milrinone 0.25 mcg via PICC. Discharge weight 133 pounds. Followed by [facility].   Admitted 8/2 through 8/6 with volume overload. Diuresed with IV lasix and milrinone was increased to 0.375 mcg. Transitioned to torsemide 80 mg daily. Discharge weight was 137 pounds.   He presents today for HF follow up with his daughter. A week ago he had a GI virus and his daughter started  Pedialyte. He didn't take his medications for a week. His renal function went up at that time as well so metolazone was stopped. Overall feeling ok. Coughing at night. Weight at home trending up.  Able to eat. Denies SOB/PND. + orthopnea. Sleeping in a recliner. Taking all medications again.   ECHO 05/05/13: EF 10%, diff HK, grade II DD, mild MR, LA mod dilated, RV mildly dilated and sys fx mod reduced, mod TR ECHO 11/04/13  EF 10% mod-severe MR RV mod reduced. TAPSE 1.6  ECHO 01/2015 EF 10-15%   Labs 07/15/13 K 5.7 Creatinine 1.95 instructed to cut lisinopril 5 mg daily.  Labs 07/22/13 K 3.6 Creatinine 0.81 Dig 1.9 digoxin stopped Labs  08/17/13 K 3.7 Creatinine 0.8 Glucose 190           08/20/13: K+ 3.7, Creatinine 0.70, Glucose 123, Na 137           10/22/13: K 3.9, creatinine 0.69, glucose 179           02/11/14: K 4.3 cr 1.2           07/05/2014: K 5.4 Creatinine  0.96 potassium cut back to 20 meq once a day.            07/12/2014: K 4.4 Creatinine 0.95             11/09/2014: K 3.6 Creatinine 1.04             6/16: K 3.9, creatinine 1.01, hgb 11.9             12/29/14: K 3.9 Creatinine 1.19              01/22/2015: K 4.4 Creatinine 0.89              05/10/2015: K 4.3 Creatinine 1.04              08/23/2015; K 3.7 Creatinine 0.65   SH: Lives with his daughter, son in law, and 2 granddaughters.     ROS: All systems negative except as listed in HPI, PMH and Problem List.  Past Medical History  Diagnosis Date  . Biventricular CHF (congestive heart failure) (HCC)     a. Echo 8/16:  EF 10-15%, diff HK, trivial AI, mild MR, mild LAE, mod RVE, mild reduced RVF, mild TR, PASP 40 mmHg, mild eff without tamponade.   . Hypertension   .  Impaired vision in both eyes   . AICD (automatic cardioverter/defibrillator) present   . Hyperlipidemia   . Type II diabetes mellitus (HCC)   . Depression   . GERD (gastroesophageal reflux disease)   . Blind left eye   . NICM (nonischemic cardiomyopathy) (HCC)     Current Outpatient Prescriptions  Medication Sig Dispense Refill  . aspirin EC 81 MG tablet Take 81 mg by mouth daily.    . carvedilol (COREG) 3.125 MG tablet TAKE 1 TABLET BY MOUTH TWO TIMES DAILY WITH A MEAL 60 tablet 3  . collagenase (SANTYL) ointment Apply 1 application topically daily. Apply to affected wound site once daily with clean dry gauze dressing. 15 g 0  . Insulin Glargine (LANTUS) 100 UNIT/ML Solostar Pen Inject 5 Units into the skin daily at 10 pm. 15 mL 11  . Insulin Pen Needle 31G X 4 MM MISC 1 Units by Does not apply route daily. 100 each 3  . Magnesium 200 MG TABS Take 200 mg by mouth daily.    . Melatonin 10 MG TABS Take 10 mg by mouth at bedtime.    . milrinone (PRIMACOR) 20 MG/100ML SOLN infusion Inject 25.875 mcg/min into the vein continuous. 100 mL 999  . mirtazapine (REMERON) 15 MG tablet Take 0.5 tablets (7.5 mg total) by mouth at  bedtime. 45 tablet 1  . pantoprazole (PROTONIX) 20 MG tablet Take 1 tablet (20 mg total) by mouth daily. 90 tablet 3  . potassium chloride SA (K-DUR,KLOR-CON) 20 MEQ tablet Take 2 tablets (40 mEq total) by mouth 2 (two) times daily. 120 tablet 3  . sertraline (ZOLOFT) 50 MG tablet Take 1 tablet (50 mg total) by mouth daily. 90 tablet 3  . simvastatin (ZOCOR) 5 MG tablet Take 1 tablet (5 mg total) by mouth at bedtime. 30 tablet 6  . torsemide (DEMADEX) 20 MG tablet Take 4 tabs in AM and 2 tabs in PM 180 tablet 3   No current facility-administered medications for this encounter.    Filed Vitals:   08/30/15 1400  BP: 96/70  Pulse: 107  Weight: 147 lb 3.2 oz (66.769 kg)  SpO2: 99%    PHYSICAL EXAM: General: Chronically ill appearing. Arrived in wheel chair. Daughter present  HEENT: normal; arcus senilus.  Neck: supple. JVP  To jaw . Carotids 2+ bilaterally; no bruits. No lymphadenopathy or thryomegaly appreciated. Cor: PMI laterally displaced.Tachy regular. No rubs. + S3; +3/6 systolic murmur  Tunneled PICC in R upper chest. Dressing CDI Lungs: clear Abdomen:  nontender, ++distended. No hepatosplenomegaly. No bruits or masses. Good bowel sounds. Extremities: no cyanosis, clubbing, rash; RUE picc; R and L 1+ edema.    Neuro: alert & orientedx3, cranial nerves grossly intact. Moves all 4 extremities w/o difficulty. Affect pleasant.  ASSESSMENT & PLAN: 1) Chronic biventricular HF: NICM s/p ICD. EF 10% mod-severe MR RV mod reduced. He is on chronic home milrinone. NYHA class IIIb. He remains stable on milrinone.  Volume status elevated. Up at least 10 pounds from baseline. Continue  torsemide to 80 mg /40 mg .  Today I have asked him to take metolazone today and tomorrow.   - Continue current milrinone. - Continue coreg 3.125 mg BID for now.  - No ACEI with soft bp.  -Continue [facility] for home inotropes. - Reinforced the need and importance of daily weights, a low sodium diet, and fluid  restriction (less than 2 L a day). Instructed to call the HF clinic if weight increases more than 3  lbs overnight or 5 lbs in a week. 2)  Diabetes with diabetic retinopathy- Referred to [facility] for PCP.   3) Sinus Tachycardia- Not candidate for ivabradine due to shock/milrinone.  4) DNR  Follow up in 6 weeks.  Fredia Beets asked his daughter to call HF clinic if weight does not come down.   Amy Clegg NP-C  1:57 PM

## 2015-08-30 NOTE — Patient Instructions (Signed)
Please take a metolazone today and tomorrow, with an additional 20 MEQ of potassium   Your physician recommends that you schedule a follow-up appointment in: 6 weeks In [facility]  Do the following things EVERYDAY: 1) Weigh yourself in the morning before breakfast. Write it down and keep it in a log. 2) Take your medicines as prescribed 3) Eat low salt foods-Limit salt (sodium) to 2000 mg per day.  4) Stay as active as you can everyday 5) Limit all fluids for the day to less than 2 liters 6)

## 2015-08-30 NOTE — Progress Notes (Signed)
Advanced Heart Failure Medication Review by a Pharmacist  Does the patient  feel that his/her medications are working for him/her?  yes  Has the patient been experiencing any side effects to the medications prescribed?  no  Does the patient measure his/her own blood pressure or blood glucose at home?  yes   Does the patient have any problems obtaining medications due to transportation or finances?   no  Understanding of regimen: good Understanding of indications: good Potential of compliance: good Patient understands to avoid NSAIDs. Patient understands to avoid decongestants.  Issues to address at subsequent visits: None   Pharmacist comments:  Gary Gallegos is a pleasant 70 yo M presenting with his daughter who manages his medications. They report good compliance with his regimen but do state that potassium was out of stock in multiple pharmacies so he was out of potassium for 3 days and restarted yesterday evening. He has not taken any metolazone since he was last seen.   Tyler Deis. Bonnye Fava, PharmD, BCPS, CPP Clinical Pharmacist Pager: [phone] Phone: [phone] 08/30/2015 2:12 PM      Time with patient: 10 minutes Preparation and documentation time: 2 minutes Total time: 12 minutes

## 2015-09-05 NOTE — Addendum Note (Signed)
Addended by: Neomia Dear on: 09/05/2015 05:54 PM   Modules accepted: Orders

## 2015-09-09 ENCOUNTER — Telehealth (HOSPITAL_COMMUNITY): Payer: Self-pay | Admitting: *Deleted

## 2015-09-09 NOTE — Telephone Encounter (Signed)
Gary Gallegos left a VM yesterday needing VO to re-cert pt, called back and left a VM ok to recert

## 2015-09-16 ENCOUNTER — Other Ambulatory Visit (HOSPITAL_COMMUNITY): Payer: Self-pay | Admitting: Internal Medicine

## 2015-09-19 DIAGNOSIS — I110 Hypertensive heart disease with heart failure: Secondary | ICD-10-CM | POA: Diagnosis not present

## 2015-09-19 DIAGNOSIS — I5023 Acute on chronic systolic (congestive) heart failure: Secondary | ICD-10-CM | POA: Diagnosis not present

## 2015-09-19 DIAGNOSIS — E11319 Type 2 diabetes mellitus with unspecified diabetic retinopathy without macular edema: Secondary | ICD-10-CM | POA: Diagnosis not present

## 2015-09-19 DIAGNOSIS — I429 Cardiomyopathy, unspecified: Secondary | ICD-10-CM | POA: Diagnosis not present

## 2015-10-04 ENCOUNTER — Encounter: Payer: Self-pay | Admitting: Internal Medicine

## 2015-10-07 ENCOUNTER — Telehealth (HOSPITAL_COMMUNITY): Payer: Self-pay | Admitting: Cardiology

## 2015-10-07 ENCOUNTER — Other Ambulatory Visit (HOSPITAL_COMMUNITY): Payer: Self-pay | Admitting: Internal Medicine

## 2015-10-07 ENCOUNTER — Other Ambulatory Visit (HOSPITAL_COMMUNITY): Payer: Self-pay

## 2015-10-07 MED ORDER — POTASSIUM CHLORIDE CRYS ER 20 MEQ PO TBCR: 40.0000 meq | EXTENDED_RELEASE_TABLET | Freq: Three times a day (TID) | ORAL | Status: DC

## 2015-10-07 NOTE — Telephone Encounter (Signed)
ABNORMAL LABS K 3.4 PER VO AMY CLEGG.NP INCREASE POTASSIUM TO 40 MEQ, THREE TIMES PER DAY  PATIENTS DAUGHTER AWARE VIA MEGAN BRADLEY, RN

## 2015-10-11 ENCOUNTER — Encounter (HOSPITAL_COMMUNITY): Payer: Medicare Other

## 2015-10-11 ENCOUNTER — Telehealth (HOSPITAL_COMMUNITY): Payer: Self-pay | Admitting: *Deleted

## 2015-10-11 NOTE — Telephone Encounter (Signed)
Gary Grana, RN w/[facility] called to let us know she is seeing pt today and his wt is up 9 lb from last week.  He is 149 lb today, he was 140.6 last week when she was there.  She states he his having some edema and is a little more SOB than usual.  Upon discuss w/daughter pt did not receive his Tor Sun or Mon.  She will give it to him today, also advised to give pt his prn Metolazone since wt is up so much.  Pt is sch for an appt w/us [facility] and daughter will bring him to that.

## 2015-10-13 ENCOUNTER — Ambulatory Visit (HOSPITAL_COMMUNITY)
Admission: RE | Admit: 2015-10-13 | Discharge: 2015-10-13 | Disposition: A | Discharge status: Home / Self Care | Payer: Medicare HMO | Source: Ambulatory Visit | Attending: Cardiology | Admitting: Cardiology

## 2015-10-13 ENCOUNTER — Other Ambulatory Visit (HOSPITAL_COMMUNITY): Payer: Self-pay | Admitting: Internal Medicine

## 2015-10-13 ENCOUNTER — Encounter (HOSPITAL_COMMUNITY): Payer: Medicare Other

## 2015-10-13 VITALS — BP 96/1 | HR 98 | Wt 148.2 lb

## 2015-10-13 DIAGNOSIS — I110 Hypertensive heart disease with heart failure: Secondary | ICD-10-CM | POA: Diagnosis not present

## 2015-10-13 DIAGNOSIS — Z79899 Other long term (current) drug therapy: Secondary | ICD-10-CM | POA: Insufficient documentation

## 2015-10-13 DIAGNOSIS — F329 Major depressive disorder, single episode, unspecified: Secondary | ICD-10-CM | POA: Insufficient documentation

## 2015-10-13 DIAGNOSIS — I5022 Chronic systolic (congestive) heart failure: Secondary | ICD-10-CM

## 2015-10-13 DIAGNOSIS — H5442 Blindness, left eye, normal vision right eye: Secondary | ICD-10-CM | POA: Insufficient documentation

## 2015-10-13 DIAGNOSIS — Z66 Do not resuscitate: Secondary | ICD-10-CM | POA: Insufficient documentation

## 2015-10-13 DIAGNOSIS — K219 Gastro-esophageal reflux disease without esophagitis: Secondary | ICD-10-CM | POA: Diagnosis not present

## 2015-10-13 DIAGNOSIS — E785 Hyperlipidemia, unspecified: Secondary | ICD-10-CM | POA: Diagnosis not present

## 2015-10-13 DIAGNOSIS — Z95810 Presence of automatic (implantable) cardiac defibrillator: Secondary | ICD-10-CM | POA: Insufficient documentation

## 2015-10-13 DIAGNOSIS — I428 Other cardiomyopathies: Secondary | ICD-10-CM | POA: Insufficient documentation

## 2015-10-13 DIAGNOSIS — Z7982 Long term (current) use of aspirin: Secondary | ICD-10-CM | POA: Insufficient documentation

## 2015-10-13 DIAGNOSIS — Z794 Long term (current) use of insulin: Secondary | ICD-10-CM | POA: Insufficient documentation

## 2015-10-13 DIAGNOSIS — I509 Heart failure, unspecified: Secondary | ICD-10-CM | POA: Diagnosis present

## 2015-10-13 DIAGNOSIS — R000 Tachycardia, unspecified: Secondary | ICD-10-CM | POA: Insufficient documentation

## 2015-10-13 DIAGNOSIS — E11319 Type 2 diabetes mellitus with unspecified diabetic retinopathy without macular edema: Secondary | ICD-10-CM | POA: Insufficient documentation

## 2015-10-13 NOTE — Progress Notes (Signed)
Advanced Heart Failure Medication Review by a Pharmacist  Does the patient  feel that his/her medications are working for him/her?  yes  Has the patient been experiencing any side effects to the medications prescribed?  no  Does the patient measure his/her own blood pressure or blood glucose at home?  yes   Does the patient have any problems obtaining medications due to transportation or finances?   no  Understanding of regimen: good Understanding of indications: good Potential of compliance: good Patient understands to avoid NSAIDs. Patient understands to avoid decongestants.  Issues to address at subsequent visits: none   Pharmacist comments: Mr Lamping is a 70 yo man here for f/u in HF clinic, here with his daughter, who manages his medications. Labs on 4/18 showed K of 3.4 prescribed potassium. Daughter was unable to get Rx day it was prescribed and had just started giving him his potassium pills. Usually maintains excellent adherence, however missed medications on Sunday and Monday because pharmacy was unable to fill medications. Previously getting medications at [facility], which has now been switched to [facility] to prevent future issues. Denies lightheadedness, dizziness, or muscle aches or cramps.   Hillery Aldo, [place].D., BCPS PGY2 Cardiology Pharmacy Resident Pager: [phone]    Time with patient: 5 min Preparation and documentation time: 5 min Total time: 5 min

## 2015-10-13 NOTE — Patient Instructions (Signed)
Take a dose of Metolazone tomorrow  Your physician recommends that you schedule a follow-up appointment in: 6 weeks In the [facility]   Do the following things EVERYDAY: 1) Weigh yourself in the morning before breakfast. Write it down and keep it in a log. 2) Take your medicines as prescribed 3) Eat low salt foods-Limit salt (sodium) to 2000 mg per day.  4) Stay as active as you can everyday 5) Limit all fluids for the day to less than 2 liters 6)

## 2015-10-13 NOTE — Progress Notes (Signed)
Patient ID: Asher Rajaram, male   DOB: [birth year], 70 y.o.   MRN: [id number] Patient ID: Andria Diperna, male   DOB: 02/01/1946, 70 y.o.   MRN: [id number]   PCP: Dr. Worthy Rancher, FNP Primary Cardiologist: Dr. Tobias Alexander Primary HF Cardiologist: Dr Shirlee Latch.   HPI: Mr. Delo is a 70 yo male referred to the HF clinic by Dr.Nelson. He has a history of DM2 with severe diabetic retinopathy legally blind September , cataracts HTN, HLD, chronic systolic HF due to severe, EF 56% (11/14) s/p ICD Medtronic.   Started having problems with his heart in early 2013. Was followed by cardiologist at [facility]. Had cath in 2013 and arteries were "fine".  Admitted to [facility] in January with N/V/D. EGD  Showed severe gastritis and esophagitis. Started on Milrinone 0.25 mcg via PICC. Discharge weight 133 pounds. Followed by [facility].   Admitted 8/2 through 8/6 with volume overload. Diuresed with IV lasix and milrinone was increased to 0.375 mcg. Transitioned to torsemide 80 mg daily. Discharge weight was 137 pounds.   He presents today for HF follow up with his daughter. Since the last visit he had trouble getting diuretics but he swtiched pharmacist. He had been off meds for several days. + Orthopnea. Mild dyspnea with exertion. Denies PND.  Weight at home trending up.   Sleeping in a recliner. Taking all medications again. He continues on milrinone. Followed by [facility].   ECHO 05/05/13: EF 10%, diff HK, grade II DD, mild MR, LA mod dilated, RV mildly dilated and sys fx mod reduced, mod TR ECHO 11/04/13  EF 10% mod-severe MR RV mod reduced. TAPSE 1.6  ECHO 01/2015 EF 10-15%   Labs 07/15/13 K 5.7 Creatinine 1.95 instructed to cut lisinopril 5 mg daily.  Labs 07/22/13 K 3.6 Creatinine 0.81 Dig 1.9 digoxin stopped Labs  08/17/13 K 3.7 Creatinine 0.8 Glucose 190           08/20/13: K+ 3.7, Creatinine 0.70, Glucose 123, Na 137           10/22/13: K 3.9, creatinine 0.69, glucose 179           02/11/14: K 4.3 cr 1.2  07/05/2014: K 5.4 Creatinine 0.96 potassium cut back to 20 meq once a day.            07/12/2014: K 4.4 Creatinine 0.95             11/09/2014: K 3.6 Creatinine 1.04             6/16: K 3.9, creatinine 1.01, hgb 11.9             12/29/14: K 3.9 Creatinine 1.19              01/22/2015: K 4.4 Creatinine 0.89              05/10/2015: K 4.3 Creatinine 1.04              08/23/2015; K 3.7 Creatinine 0.65   SH: Lives with his daughter, son in law, and 2 granddaughters.     ROS: All systems negative except as listed in HPI, PMH and Problem List.  Past Medical History  Diagnosis Date  . Biventricular CHF (congestive heart failure) (HCC)     a. Echo 8/16:  EF 10-15%, diff HK, trivial AI, mild MR, mild LAE, mod RVE, mild reduced RVF, mild TR, PASP 40 mmHg, mild eff without tamponade.   . Hypertension   . Impaired vision in both eyes   .  AICD (automatic cardioverter/defibrillator) present   . Hyperlipidemia   . Type II diabetes mellitus (HCC)   . Depression   . GERD (gastroesophageal reflux disease)   . Blind left eye   . NICM (nonischemic cardiomyopathy) (HCC)     Current Outpatient Prescriptions  Medication Sig Dispense Refill  . aspirin EC 81 MG tablet Take 81 mg by mouth daily.    . carvedilol (COREG) 3.125 MG tablet TAKE 1 TABLET BY MOUTH TWO TIMES DAILY WITH A MEAL 60 tablet 3  . collagenase (SANTYL) ointment Apply 1 application topically daily. Apply to affected wound site once daily with clean dry gauze dressing. 15 g 0  . Insulin Glargine (LANTUS) 100 UNIT/ML Solostar Pen Inject 5 Units into the skin daily at 10 pm. 15 mL 11  . Insulin Pen Needle 31G X 4 MM MISC 1 Units by Does not apply route daily. 100 each 3  . Magnesium 200 MG TABS Take 200 mg by mouth daily.    . metolazone (ZAROXOLYN) 5 MG tablet Take 5 mg by mouth daily as needed (shortness of breath/edema).    . milrinone (PRIMACOR) 20 MG/100ML SOLN infusion Inject 25.875 mcg/min into the vein continuous. 100 mL 999  . mirtazapine  (REMERON) 15 MG tablet Take 0.5 tablets (7.5 mg total) by mouth at bedtime. 45 tablet 1  . pantoprazole (PROTONIX) 20 MG tablet Take 1 tablet (20 mg total) by mouth daily. 90 tablet 3  . potassium chloride SA (K-DUR,KLOR-CON) 20 MEQ tablet Take 2 tablets (40 mEq total) by mouth 3 (three) times daily. 120 tablet 3  . sertraline (ZOLOFT) 50 MG tablet Take 1 tablet (50 mg total) by mouth daily. 90 tablet 3  . simvastatin (ZOCOR) 5 MG tablet Take 1 tablet (5 mg total) by mouth at bedtime. 30 tablet 6  . torsemide (DEMADEX) 20 MG tablet Take 4 tabs in AM and 2 tabs in PM 180 tablet 3   No current facility-administered medications for this encounter.    Filed Vitals:   10/13/15 1206  BP: 96/1  Pulse: 98  Weight: 148 lb 3.2 oz (67.223 kg)  SpO2: 100%    PHYSICAL EXAM: General: Chronically ill appearing. Ambulated in the clinic with his daughter.  HEENT: normal; arcus senilus.  Neck: supple. JVP to jaww . Carotids 2+ bilaterally; no bruits. No lymphadenopathy or thryomegaly appreciated. Cor: PMI laterally displaced. Regular. No rubs. + S3; +3/6 systolic murmur  Tunneled PICC in R upper chest. Dressing CDI Lungs: clear Abdomen:  nontender, ++distended. No hepatosplenomegaly. No bruits or masses. Good bowel sounds. Extremities: no cyanosis, clubbing, rash; R and L 1+ edema.    Neuro: alert & orientedx3, cranial nerves grossly intact. Moves all 4 extremities w/o difficulty. Affect pleasant.  ASSESSMENT & PLAN: 1) Chronic biventricular HF: NICM s/p ICD. EF 10% mod-severe MR RV mod reduced. He is on chronic home milrinone. NYHA class IIIb. He remains stable on milrinone.  Volume status elevated but had been off meds for several days.  Continue  torsemide to 80 mg /40 mg . Instructed to metolazone tomorrow.   - Continue current milrinone. - Continue coreg 3.125 mg BID for now.  - No ACEI with soft bp.  -Continue [facility] for home inotropes. - Reinforced the need and importance of daily weights, a low  sodium diet, and fluid restriction (less than 2 L a day). Instructed to call the HF clinic if weight increases more than 3 lbs overnight or 5 lbs in a week. 2)  Diabetes  with diabetic retinopathy- Referred to [facility] for PCP.   3) Sinus Tachycardia- Not candidate for ivabradine due to shock/milrinone.  4) DNR  Follow up in 6 weeks.   Chaniece Barbato NP-C  12:22 PM

## 2015-10-14 ENCOUNTER — Other Ambulatory Visit (HOSPITAL_COMMUNITY): Payer: Self-pay | Admitting: Adult Health

## 2015-10-25 ENCOUNTER — Encounter (HOSPITAL_COMMUNITY): Payer: Medicare Other

## 2015-10-28 ENCOUNTER — Other Ambulatory Visit (HOSPITAL_COMMUNITY): Payer: Self-pay | Admitting: Internal Medicine

## 2015-11-07 ENCOUNTER — Other Ambulatory Visit (HOSPITAL_COMMUNITY): Payer: Self-pay | Admitting: Internal Medicine

## 2015-11-16 ENCOUNTER — Telehealth (HOSPITAL_COMMUNITY): Payer: Self-pay | Admitting: Student

## 2015-11-16 DIAGNOSIS — I5022 Chronic systolic (congestive) heart failure: Secondary | ICD-10-CM

## 2015-11-16 MED ORDER — TORSEMIDE 20 MG PO TABS: 40.0000 mg | ORAL_TABLET | Freq: Two times a day (BID) | ORAL | Status: DC

## 2015-11-16 MED ORDER — POTASSIUM CHLORIDE CRYS ER 20 MEQ PO TBCR: 20.0000 meq | EXTENDED_RELEASE_TABLET | Freq: Two times a day (BID) | ORAL | Status: DC

## 2015-11-16 MED ORDER — METOLAZONE 5 MG PO TABS: 5.0000 mg | ORAL_TABLET | Freq: Every day | ORAL | Status: DC | PRN

## 2015-11-16 NOTE — Telephone Encounter (Signed)
Creatinine up from 1.2-1.3 range up to 2.83 in past week.  K also from 3.4 -> 6.0.  Daughter, [name], says weight has been trending down, though he has been feeling OK.  States he has also been getting potassium.    Discussed with Dr. [name].     Hold torsemide and K until Saturday, June 3rd and continued to hold until Sunday ONLY IF weight stable. If weight up resume Saturday.   Decrease torsemide dose to 40 mg BID and K to 20 meq BID.    Hold metolazone all together for now.   Will have [facility] recheck labs 6/5/7 and follow up in HF clinic made for 6/7/7 at 1140.   Daughter repeated back instructions.    Casimiro Needle [address] [place], PA-C 11/16/2015 4:24 PM

## 2015-11-21 ENCOUNTER — Other Ambulatory Visit (HOSPITAL_COMMUNITY): Payer: Self-pay | Admitting: Internal Medicine

## 2015-11-21 ENCOUNTER — Telehealth (HOSPITAL_COMMUNITY): Payer: Self-pay

## 2015-11-21 NOTE — Telephone Encounter (Signed)
YasmineRN with [facility] called to report patient is starting back diuretics that were held over the weekend and that he feels and looks good. Only abnormal finding was HR in 50s. Patient has no swelling, weight stable, no SOB, no dizziness. Patient has apt in 2 days in clinic.  Advised to continue medications as prescribed and will see patient in clinic this Wednesday. Advised to call CHF triage line or report to ED if s/s worsen. Aware and agreeable to plan as stated above.  Ave Filter

## 2015-11-23 ENCOUNTER — Inpatient Hospital Stay (HOSPITAL_COMMUNITY): Admission: RE | Admit: 2015-11-23 | Payer: Medicare HMO | Source: Ambulatory Visit

## 2015-11-24 ENCOUNTER — Encounter: Payer: Self-pay | Admitting: Internal Medicine

## 2015-11-24 ENCOUNTER — Ambulatory Visit (HOSPITAL_COMMUNITY)
Admission: RE | Admit: 2015-11-24 | Discharge: 2015-11-24 | Disposition: A | Discharge status: Home / Self Care | Payer: Medicare HMO | Source: Ambulatory Visit | Attending: Cardiology | Admitting: Cardiology

## 2015-11-24 VITALS — BP 98/76 | HR 112 | Wt 143.0 lb

## 2015-11-24 DIAGNOSIS — I428 Other cardiomyopathies: Secondary | ICD-10-CM | POA: Insufficient documentation

## 2015-11-24 DIAGNOSIS — Z95810 Presence of automatic (implantable) cardiac defibrillator: Secondary | ICD-10-CM | POA: Diagnosis not present

## 2015-11-24 DIAGNOSIS — Z79899 Other long term (current) drug therapy: Secondary | ICD-10-CM | POA: Insufficient documentation

## 2015-11-24 DIAGNOSIS — Z794 Long term (current) use of insulin: Secondary | ICD-10-CM | POA: Diagnosis not present

## 2015-11-24 DIAGNOSIS — H5442 Blindness, left eye, normal vision right eye: Secondary | ICD-10-CM | POA: Diagnosis not present

## 2015-11-24 DIAGNOSIS — I110 Hypertensive heart disease with heart failure: Secondary | ICD-10-CM | POA: Diagnosis not present

## 2015-11-24 DIAGNOSIS — F329 Major depressive disorder, single episode, unspecified: Secondary | ICD-10-CM | POA: Diagnosis not present

## 2015-11-24 DIAGNOSIS — K219 Gastro-esophageal reflux disease without esophagitis: Secondary | ICD-10-CM | POA: Insufficient documentation

## 2015-11-24 DIAGNOSIS — E785 Hyperlipidemia, unspecified: Secondary | ICD-10-CM | POA: Diagnosis not present

## 2015-11-24 DIAGNOSIS — Z66 Do not resuscitate: Secondary | ICD-10-CM | POA: Diagnosis not present

## 2015-11-24 DIAGNOSIS — R000 Tachycardia, unspecified: Secondary | ICD-10-CM | POA: Insufficient documentation

## 2015-11-24 DIAGNOSIS — I5022 Chronic systolic (congestive) heart failure: Secondary | ICD-10-CM

## 2015-11-24 DIAGNOSIS — E11319 Type 2 diabetes mellitus with unspecified diabetic retinopathy without macular edema: Secondary | ICD-10-CM | POA: Insufficient documentation

## 2015-11-24 DIAGNOSIS — Z7982 Long term (current) use of aspirin: Secondary | ICD-10-CM | POA: Insufficient documentation

## 2015-11-24 NOTE — Progress Notes (Signed)
Advanced Heart Failure Medication Review by a Pharmacist  Does the patient  feel that his/her medications are working for him/her?  yes  Has the patient been experiencing any side effects to the medications prescribed?  no  Does the patient measure his/her own blood pressure or blood glucose at home?  Yes, the home health RN checks his BP   Does the patient have any problems obtaining medications due to transportation or finances?   no  Understanding of regimen: poor, but daughter handles his medications Understanding of indications: poor Potential of compliance: good Patient understands to avoid NSAIDs. Patient understands to avoid decongestants.   Pharmacist comments: [name] is a pleasant 9 yom presenting to clinic for a follow-up appointment. His daughter helps him with his medications and has a great understanding of his regimen. He has recently started taking melatonin to help with sleep and states that it is working well. I noticed that the patient is taking his diuretic in the morning and at bedtime and that it causes him to get up to use the restroom frequently during the night. I recommended to his daughter that he can take the nighttime dose in the early afternoon (as long as the dose is at least 6 hours after the morning dose). Patient states that he has not experienced any medication-related side effects and has no medication-related questions at this time.   Time with patient: 10 min  Preparation and documentation time: 2 min  Total time: 12 min   Flavio Lindroth C. Marvis Moeller, PharmD Pharmacy Resident  Pager: [phone] 11/24/2015 3:32 PM

## 2015-11-24 NOTE — Patient Instructions (Signed)
Follow-up in 6 weeks

## 2015-11-24 NOTE — Progress Notes (Signed)
Patient ID: Gary Gallegos, male   DOB: 30-Jan-1946, 70 y.o.   MRN: [id number]   PCP: Dr. Worthy Rancher, FNP Primary Cardiologist: Dr. Tobias Gallegos Primary HF Cardiologist: Dr Gary Gallegos.   HPI: Gary Gallegos is a 70 yo male referred to the HF clinic by Dr.Nelson. He has a history of DM2 with severe diabetic retinopathy legally blind September , cataracts HTN, HLD, chronic systolic HF due to severe, EF 37% (11/14) s/p ICD Med[employer].   Started having problems with his heart in early 2013. Was followed by cardiologist at [facility]. Had cath in 2013 and arteries were "fine".  Admitted to [facility] in January with N/V/D. EGD  Showed severe gastritis and esophagitis. Started on Milrinone 0.25 mcg via PICC. Discharge weight 133 pounds. Followed by [facility].   Admitted 8/2 through 8/6 with volume overload. Diuresed with IV lasix and milrinone was increased to 0.375 mcg. Transitioned to torsemide 80 mg daily. Discharge weight was 137 pounds.   He presents today for HF follow up with his daughter. Yesterday he ate an entire watermelon. Weight at home 133-134 pounds. Denis PND/Orthopnea. SOB with steps. Taking all medications again. He continues on milrinone. Followed by [facility].   ECHO 05/05/13: EF 10%, diff HK, grade II DD, mild MR, LA mod dilated, RV mildly dilated and sys fx mod reduced, mod TR ECHO 11/04/13  EF 10% mod-severe MR RV mod reduced. TAPSE 1.6  ECHO 01/2015 EF 10-15%   Labs 07/15/13 K 5.7 Creatinine 1.95 instructed to cut lisinopril 5 mg daily.  Labs 07/22/13 K 3.6 Creatinine 0.81 Dig 1.9 digoxin stopped Labs  08/17/13 K 3.7 Creatinine 0.8 Glucose 190           08/20/13: K+ 3.7, Creatinine 0.70, Glucose 123, Na 137           10/22/13: K 3.9, creatinine 0.69, glucose 179           02/11/14: K 4.3 cr 1.2           07/05/2014: K 5.4 Creatinine 0.96 potassium cut back to 20 meq once a day.            07/12/2014: K 4.4 Creatinine 0.95             11/09/2014: K 3.6 Creatinine 1.04             6/16: K 3.9,  creatinine 1.01, hgb 11.9             12/29/14: K 3.9 Creatinine 1.19              01/22/2015: K 4.4 Creatinine 0.89              05/10/2015: K 4.3 Creatinine 1.04              08/23/2015; K 3.7 Creatinine 0.65   SH: Lives with his daughter, son in law, and 2 granddaughters.     ROS: All systems negative except as listed in HPI, PMH and Problem List.  Past Medical History  Diagnosis Date  . Biventricular CHF (congestive heart failure) (HCC)     a. Echo 8/16:  EF 10-15%, diff HK, trivial AI, mild MR, mild LAE, mod RVE, mild reduced RVF, mild TR, PASP 40 mmHg, mild eff without tamponade.   . Hypertension   . Impaired vision in both eyes   . AICD (automatic cardioverter/defibrillator) present   . Hyperlipidemia   . Type II diabetes mellitus (HCC)   . Depression   . GERD (gastroesophageal reflux disease)   .  Blind left eye   . NICM (nonischemic cardiomyopathy) (HCC)     Current Outpatient Prescriptions  Medication Sig Dispense Refill  . aspirin EC 81 MG tablet Take 81 mg by mouth daily.    . carvedilol (COREG) 3.125 MG tablet TAKE 1 TABLET BY MOUTH TWO TIMES DAILY WITH A MEAL 60 tablet 3  . Insulin Glargine (LANTUS) 100 UNIT/ML Solostar Pen Inject 5 Units into the skin daily at 10 pm. 15 mL 11  . Insulin Pen Needle 31G X 4 MM MISC 1 Units by Does not apply route daily. 100 each 3  . Magnesium 200 MG TABS Take 200 mg by mouth daily.    . Melatonin 1 MG TABS Take 1 mg by mouth at bedtime.    . metolazone (ZAROXOLYN) 5 MG tablet Take 1 tablet (5 mg total) by mouth daily as needed (shortness of breath/edema). As directed by HF clinic.    . milrinone (PRIMACOR) 20 MG/100ML SOLN infusion Inject 25.875 mcg/min into the vein continuous. 100 mL 999  . pantoprazole (PROTONIX) 20 MG tablet Take 1 tablet (20 mg total) by mouth daily. 90 tablet 3  . potassium chloride SA (K-DUR,KLOR-CON) 20 MEQ tablet Take 40 mEq by mouth 2 (two) times daily. Take (2 tablets) in the morning and (2 tablets)  in the evening.    . sertraline (ZOLOFT) 50 MG tablet Take 1 tablet (50 mg total) by mouth daily. 90 tablet 3  . simvastatin (ZOCOR) 5 MG tablet Take 1 tablet (5 mg total) by mouth at bedtime. 30 tablet 6  . torsemide (DEMADEX) 20 MG tablet Take 40-80 mg by mouth 2 (two) times daily. Take  (4 tablets) in the morning and  (2 tablets) in the evening.    . mirtazapine (REMERON) 15 MG tablet Take 0.5 tablets (7.5 mg total) by mouth at bedtime. (Patient not taking: Reported on 11/24/2015) 45 tablet 1   No current facility-administered medications for this encounter.    Filed Vitals:   11/24/15 1511  BP: 98/76  Pulse: 112  Weight: 143 lb (64.864 kg)  SpO2: 99%    PHYSICAL EXAM: General: Chronically ill appearing. Ambulated in the clinic with his daughter.  HEENT: normal; arcus senilus.  Neck: supple. JVP 7-8 . Carotids 2+ bilaterally; no bruits. No lymphadenopathy or thryomegaly appreciated. Cor: PMI laterally displaced. Regular. No rubs. + S3; +3/6 systolic murmur  Tunneled PICC in R upper chest. Dressing CDI Lungs: clear Abdomen:  nontender, nondistended. No hepatosplenomegaly. No bruits or masses. Good bowel sounds. Extremities: no cyanosis, clubbing, rash; R and L trace edema.    Neuro: alert & orientedx3, cranial nerves grossly intact. Moves all 4 extremities w/o difficulty. Affect pleasant.  ASSESSMENT & PLAN: 1) Chronic biventricular HF: NICM s/p ICD. EF 10% mod-severe MR RV mod reduced. He is on chronic home milrinone.  NYHA class IIIb. He remains stable on milrinone.  Volume status ok. Continue  torsemide to 80 mg /40 mg . Continue metolazone as needed.    - Continue current milrinone. - Continue coreg 3.125 mg BID for now.  - No ACEI with soft bp.  -Continue AHC for home inotropes. - Reinforced the need and importance of daily weights, a low sodium diet, and fluid restriction (less than 2 L a day). Instructed to call the HF clinic if weight increases more than 3 lbs  overnight or 5 lbs in a week. 2)  Diabetes with diabetic retinopathy- Referred to [facility] for PCP.   3) Sinus Tachycardia- Not  candidate for ivabradine due to shock/milrinone.  4) DNR  Follow up in 6-8 weeks.   Amy Clegg NP-C  3:32 PM

## 2015-12-01 ENCOUNTER — Other Ambulatory Visit (HOSPITAL_COMMUNITY): Payer: Self-pay | Admitting: Internal Medicine

## 2015-12-06 ENCOUNTER — Other Ambulatory Visit (HOSPITAL_COMMUNITY): Payer: Self-pay | Admitting: *Deleted

## 2015-12-06 DIAGNOSIS — I5022 Chronic systolic (congestive) heart failure: Secondary | ICD-10-CM

## 2015-12-06 MED ORDER — CARVEDILOL 3.125 MG PO TABS: ORAL_TABLET | ORAL | Status: DC

## 2015-12-18 ENCOUNTER — Other Ambulatory Visit (HOSPITAL_COMMUNITY): Payer: Self-pay | Admitting: Adult Health

## 2015-12-21 ENCOUNTER — Encounter (HOSPITAL_COMMUNITY): Payer: Self-pay

## 2015-12-21 ENCOUNTER — Telehealth (HOSPITAL_COMMUNITY): Payer: Self-pay

## 2015-12-21 NOTE — Telephone Encounter (Signed)
Patient moving and needs orders transferred to new [facility] location/office. Will fax letter stating patient needs [facility] services and to continue current orders and regimen to provided # [phone]  Ave Filter

## 2015-12-23 ENCOUNTER — Other Ambulatory Visit (HOSPITAL_COMMUNITY): Payer: Self-pay | Admitting: Internal Medicine

## 2015-12-23 ENCOUNTER — Encounter: Payer: Self-pay | Admitting: Internal Medicine

## 2015-12-28 ENCOUNTER — Telehealth (HOSPITAL_COMMUNITY): Payer: Self-pay | Admitting: *Deleted

## 2015-12-28 DIAGNOSIS — R32 Unspecified urinary incontinence: Secondary | ICD-10-CM | POA: Diagnosis not present

## 2015-12-28 DIAGNOSIS — M6281 Muscle weakness (generalized): Secondary | ICD-10-CM | POA: Diagnosis not present

## 2015-12-28 DIAGNOSIS — R269 Unspecified abnormalities of gait and mobility: Secondary | ICD-10-CM | POA: Diagnosis not present

## 2015-12-28 DIAGNOSIS — I509 Heart failure, unspecified: Secondary | ICD-10-CM | POA: Diagnosis not present

## 2015-12-28 MED ORDER — POTASSIUM CHLORIDE CRYS ER 20 MEQ PO TBCR: 40.0000 meq | EXTENDED_RELEASE_TABLET | Freq: Three times a day (TID) | ORAL | Status: DC

## 2015-12-28 NOTE — Telephone Encounter (Signed)
Gary Pickler, RN called to report critical low K of 2.9 drawn today.  Called and spoke w/pt's daughter Gary Gallegos she reports pt has been taking his KCL 40 meq BID for the most part, she states a few evenings he only took 20 meq.  Discussed w/Andy Tillery, PA he would like pt to increase KCL to 40 meq TID.  Gary Gallegos is aware and agreeable, order given to [facility] to repeat labs next week

## 2015-12-29 ENCOUNTER — Other Ambulatory Visit (HOSPITAL_COMMUNITY): Payer: Self-pay | Admitting: Internal Medicine

## 2015-12-29 ENCOUNTER — Telehealth (HOSPITAL_COMMUNITY): Payer: Self-pay

## 2015-12-29 MED ORDER — TORSEMIDE 20 MG PO TABS: ORAL_TABLET | ORAL | Status: DC

## 2015-12-29 NOTE — Telephone Encounter (Signed)
12/20/15 labs received via fax from [facility]. Cr 2.18 BUN 50 Per Otilio Saber PA, advised to cut back on Torsemide.  HOLD for 2 days, then reduce daily dose to 60 mg in am and 40 mg in pm (was doing 80 mg in am and 40 m in pm).  Aware and agreeable.  Follow up apt due and made while on the phone.  Ave Filter

## 2016-01-16 ENCOUNTER — Other Ambulatory Visit (HOSPITAL_COMMUNITY): Payer: Self-pay | Admitting: Internal Medicine

## 2016-01-16 ENCOUNTER — Encounter (HOSPITAL_COMMUNITY): Payer: Self-pay | Admitting: Emergency Medicine

## 2016-01-16 ENCOUNTER — Inpatient Hospital Stay (HOSPITAL_COMMUNITY)
Admission: EM | Admit: 2016-01-16 | Discharge: 2016-01-24 | DRG: 314 | Disposition: A | Discharge status: Hospice | Payer: Medicare HMO | Attending: Internal Medicine | Admitting: Internal Medicine

## 2016-01-16 ENCOUNTER — Emergency Department (HOSPITAL_COMMUNITY): Payer: Medicare HMO

## 2016-01-16 ENCOUNTER — Telehealth (HOSPITAL_COMMUNITY): Payer: Self-pay | Admitting: *Deleted

## 2016-01-16 DIAGNOSIS — N179 Acute kidney failure, unspecified: Secondary | ICD-10-CM | POA: Diagnosis present

## 2016-01-16 DIAGNOSIS — N183 Chronic kidney disease, stage 3 (moderate): Secondary | ICD-10-CM | POA: Diagnosis present

## 2016-01-16 DIAGNOSIS — E785 Hyperlipidemia, unspecified: Secondary | ICD-10-CM | POA: Diagnosis present

## 2016-01-16 DIAGNOSIS — I5023 Acute on chronic systolic (congestive) heart failure: Secondary | ICD-10-CM | POA: Diagnosis present

## 2016-01-16 DIAGNOSIS — Z79899 Other long term (current) drug therapy: Secondary | ICD-10-CM

## 2016-01-16 DIAGNOSIS — Z7982 Long term (current) use of aspirin: Secondary | ICD-10-CM | POA: Diagnosis not present

## 2016-01-16 DIAGNOSIS — D751 Secondary polycythemia: Secondary | ICD-10-CM | POA: Diagnosis present

## 2016-01-16 DIAGNOSIS — E1010 Type 1 diabetes mellitus with ketoacidosis without coma: Secondary | ICD-10-CM | POA: Diagnosis not present

## 2016-01-16 DIAGNOSIS — G9340 Encephalopathy, unspecified: Secondary | ICD-10-CM | POA: Diagnosis present

## 2016-01-16 DIAGNOSIS — E1165 Type 2 diabetes mellitus with hyperglycemia: Secondary | ICD-10-CM | POA: Diagnosis present

## 2016-01-16 DIAGNOSIS — F329 Major depressive disorder, single episode, unspecified: Secondary | ICD-10-CM | POA: Diagnosis present

## 2016-01-16 DIAGNOSIS — K219 Gastro-esophageal reflux disease without esophagitis: Secondary | ICD-10-CM | POA: Diagnosis present

## 2016-01-16 DIAGNOSIS — I130 Hypertensive heart and chronic kidney disease with heart failure and stage 1 through stage 4 chronic kidney disease, or unspecified chronic kidney disease: Secondary | ICD-10-CM | POA: Diagnosis present

## 2016-01-16 DIAGNOSIS — Z794 Long term (current) use of insulin: Secondary | ICD-10-CM | POA: Diagnosis not present

## 2016-01-16 DIAGNOSIS — E860 Dehydration: Secondary | ICD-10-CM | POA: Diagnosis present

## 2016-01-16 DIAGNOSIS — R7881 Bacteremia: Secondary | ICD-10-CM

## 2016-01-16 DIAGNOSIS — N19 Unspecified kidney failure: Secondary | ICD-10-CM

## 2016-01-16 DIAGNOSIS — I472 Ventricular tachycardia, unspecified: Secondary | ICD-10-CM

## 2016-01-16 DIAGNOSIS — N189 Chronic kidney disease, unspecified: Secondary | ICD-10-CM | POA: Diagnosis not present

## 2016-01-16 DIAGNOSIS — R5381 Other malaise: Secondary | ICD-10-CM | POA: Diagnosis present

## 2016-01-16 DIAGNOSIS — T827XXA Infection and inflammatory reaction due to other cardiac and vascular devices, implants and grafts, initial encounter: Secondary | ICD-10-CM | POA: Diagnosis present

## 2016-01-16 DIAGNOSIS — R7989 Other specified abnormal findings of blood chemistry: Secondary | ICD-10-CM

## 2016-01-16 DIAGNOSIS — Z681 Body mass index (BMI) 19 or less, adult: Secondary | ICD-10-CM | POA: Diagnosis not present

## 2016-01-16 DIAGNOSIS — N170 Acute kidney failure with tubular necrosis: Secondary | ICD-10-CM | POA: Diagnosis not present

## 2016-01-16 DIAGNOSIS — D72829 Elevated white blood cell count, unspecified: Secondary | ICD-10-CM | POA: Diagnosis not present

## 2016-01-16 DIAGNOSIS — T80211A Bloodstream infection due to central venous catheter, initial encounter: Principal | ICD-10-CM | POA: Diagnosis present

## 2016-01-16 DIAGNOSIS — E43 Unspecified severe protein-calorie malnutrition: Secondary | ICD-10-CM | POA: Diagnosis present

## 2016-01-16 DIAGNOSIS — R6521 Severe sepsis with septic shock: Secondary | ICD-10-CM | POA: Diagnosis present

## 2016-01-16 DIAGNOSIS — N289 Disorder of kidney and ureter, unspecified: Secondary | ICD-10-CM | POA: Diagnosis not present

## 2016-01-16 DIAGNOSIS — N178 Other acute kidney failure: Secondary | ICD-10-CM | POA: Diagnosis not present

## 2016-01-16 DIAGNOSIS — B9689 Other specified bacterial agents as the cause of diseases classified elsewhere: Secondary | ICD-10-CM | POA: Diagnosis not present

## 2016-01-16 DIAGNOSIS — I5020 Unspecified systolic (congestive) heart failure: Secondary | ICD-10-CM

## 2016-01-16 DIAGNOSIS — L02512 Cutaneous abscess of left hand: Secondary | ICD-10-CM | POA: Diagnosis present

## 2016-01-16 DIAGNOSIS — T80219A Unspecified infection due to central venous catheter, initial encounter: Secondary | ICD-10-CM

## 2016-01-16 DIAGNOSIS — E872 Acidosis: Secondary | ICD-10-CM | POA: Diagnosis present

## 2016-01-16 DIAGNOSIS — IMO0002 Reserved for concepts with insufficient information to code with codable children: Secondary | ICD-10-CM

## 2016-01-16 DIAGNOSIS — Y848 Other medical procedures as the cause of abnormal reaction of the patient, or of later complication, without mention of misadventure at the time of the procedure: Secondary | ICD-10-CM | POA: Diagnosis present

## 2016-01-16 DIAGNOSIS — Y831 Surgical operation with implant of artificial internal device as the cause of abnormal reaction of the patient, or of later complication, without mention of misadventure at the time of the procedure: Secondary | ICD-10-CM | POA: Diagnosis present

## 2016-01-16 DIAGNOSIS — I428 Other cardiomyopathies: Secondary | ICD-10-CM | POA: Diagnosis present

## 2016-01-16 DIAGNOSIS — T80219D Unspecified infection due to central venous catheter, subsequent encounter: Secondary | ICD-10-CM | POA: Diagnosis not present

## 2016-01-16 DIAGNOSIS — L03012 Cellulitis of left finger: Secondary | ICD-10-CM | POA: Diagnosis present

## 2016-01-16 DIAGNOSIS — E1122 Type 2 diabetes mellitus with diabetic chronic kidney disease: Secondary | ICD-10-CM | POA: Diagnosis present

## 2016-01-16 DIAGNOSIS — H548 Legal blindness, as defined in USA: Secondary | ICD-10-CM | POA: Diagnosis present

## 2016-01-16 DIAGNOSIS — Z452 Encounter for adjustment and management of vascular access device: Secondary | ICD-10-CM

## 2016-01-16 DIAGNOSIS — I110 Hypertensive heart disease with heart failure: Secondary | ICD-10-CM | POA: Diagnosis not present

## 2016-01-16 DIAGNOSIS — A4101 Sepsis due to Methicillin susceptible Staphylococcus aureus: Secondary | ICD-10-CM | POA: Diagnosis present

## 2016-01-16 DIAGNOSIS — R778 Other specified abnormalities of plasma proteins: Secondary | ICD-10-CM

## 2016-01-16 DIAGNOSIS — R570 Cardiogenic shock: Secondary | ICD-10-CM | POA: Diagnosis present

## 2016-01-16 DIAGNOSIS — B9561 Methicillin susceptible Staphylococcus aureus infection as the cause of diseases classified elsewhere: Secondary | ICD-10-CM | POA: Diagnosis not present

## 2016-01-16 DIAGNOSIS — E11319 Type 2 diabetes mellitus with unspecified diabetic retinopathy without macular edema: Secondary | ICD-10-CM | POA: Diagnosis present

## 2016-01-16 DIAGNOSIS — I5022 Chronic systolic (congestive) heart failure: Secondary | ICD-10-CM

## 2016-01-16 DIAGNOSIS — L03011 Cellulitis of right finger: Secondary | ICD-10-CM | POA: Diagnosis not present

## 2016-01-16 DIAGNOSIS — Z66 Do not resuscitate: Secondary | ICD-10-CM | POA: Diagnosis present

## 2016-01-16 DIAGNOSIS — I509 Heart failure, unspecified: Secondary | ICD-10-CM | POA: Diagnosis not present

## 2016-01-16 DIAGNOSIS — IMO0001 Reserved for inherently not codable concepts without codable children: Secondary | ICD-10-CM

## 2016-01-16 DIAGNOSIS — R112 Nausea with vomiting, unspecified: Secondary | ICD-10-CM | POA: Diagnosis present

## 2016-01-16 LAB — URINALYSIS, ROUTINE W REFLEX MICROSCOPIC
Glucose, UA: 500 mg/dL — AB
Ketones, ur: 15 mg/dL — AB
NITRITE: POSITIVE — AB
PROTEIN: 30 mg/dL — AB
Specific Gravity, Urine: 1.023 (ref 1.005–1.030)
pH: 5 (ref 5.0–8.0)

## 2016-01-16 LAB — COMPREHENSIVE METABOLIC PANEL
ALBUMIN: 3.7 g/dL (ref 3.5–5.0)
ALK PHOS: 169 U/L — AB (ref 38–126)
ALT: 20 U/L (ref 17–63)
AST: 31 U/L (ref 15–41)
Anion gap: 19 — ABNORMAL HIGH (ref 5–15)
BILIRUBIN TOTAL: 5.2 mg/dL — AB (ref 0.3–1.2)
BUN: 105 mg/dL — AB (ref 6–20)
CALCIUM: 9.8 mg/dL (ref 8.9–10.3)
CO2: 25 mmol/L (ref 22–32)
CREATININE: 3.67 mg/dL — AB (ref 0.61–1.24)
Chloride: 91 mmol/L — ABNORMAL LOW (ref 101–111)
GFR calc Af Amer: 18 mL/min — ABNORMAL LOW (ref >60)
GFR calc non Af Amer: 16 mL/min — ABNORMAL LOW (ref >60)
GLUCOSE: 528 mg/dL — AB (ref 65–99)
Potassium: 4.3 mmol/L (ref 3.5–5.1)
Sodium: 135 mmol/L (ref 135–145)
TOTAL PROTEIN: 8.4 g/dL — AB (ref 6.5–8.1)

## 2016-01-16 LAB — BASIC METABOLIC PANEL
ANION GAP: 18 — AB (ref 5–15)
ANION GAP: 10 (ref 5–15)
Anion gap: 16 — ABNORMAL HIGH (ref 5–15)
Anion gap: 15 (ref 5–15)
BUN: 108 mg/dL — ABNORMAL HIGH (ref 6–20)
BUN: 102 mg/dL — AB (ref 6–20)
BUN: 102 mg/dL — AB (ref 6–20)
BUN: 100 mg/dL — ABNORMAL HIGH (ref 6–20)
CALCIUM: 9 mg/dL (ref 8.9–10.3)
CALCIUM: 8.7 mg/dL — AB (ref 8.9–10.3)
CHLORIDE: 97 mmol/L — AB (ref 101–111)
CHLORIDE: 98 mmol/L — AB (ref 101–111)
CHLORIDE: 98 mmol/L — AB (ref 101–111)
CO2: 21 mmol/L — ABNORMAL LOW (ref 22–32)
CO2: 26 mmol/L (ref 22–32)
CO2: 26 mmol/L (ref 22–32)
CO2: 29 mmol/L (ref 22–32)
CREATININE: 3.7 mg/dL — AB (ref 0.61–1.24)
CREATININE: 3.11 mg/dL — AB (ref 0.61–1.24)
Calcium: 9.1 mg/dL (ref 8.9–10.3)
Calcium: 9.2 mg/dL (ref 8.9–10.3)
Chloride: 101 mmol/L (ref 101–111)
Creatinine, Ser: 3.5 mg/dL — ABNORMAL HIGH (ref 0.61–1.24)
Creatinine, Ser: 3.5 mg/dL — ABNORMAL HIGH (ref 0.61–1.24)
GFR calc Af Amer: 19 mL/min — ABNORMAL LOW (ref >60)
GFR calc Af Amer: 19 mL/min — ABNORMAL LOW (ref >60)
GFR calc non Af Amer: 15 mL/min — ABNORMAL LOW (ref >60)
GFR calc non Af Amer: 17 mL/min — ABNORMAL LOW (ref >60)
GFR calc non Af Amer: 17 mL/min — ABNORMAL LOW (ref >60)
GFR, EST AFRICAN AMERICAN: 18 mL/min — AB (ref >60)
GFR, EST AFRICAN AMERICAN: 22 mL/min — AB (ref >60)
GFR, EST NON AFRICAN AMERICAN: 19 mL/min — AB (ref >60)
GLUCOSE: 246 mg/dL — AB (ref 65–99)
GLUCOSE: 85 mg/dL (ref 65–99)
Glucose, Bld: 490 mg/dL — ABNORMAL HIGH (ref 65–99)
Glucose, Bld: 303 mg/dL — ABNORMAL HIGH (ref 65–99)
Potassium: 4.3 mmol/L (ref 3.5–5.1)
Potassium: 3.1 mmol/L — ABNORMAL LOW (ref 3.5–5.1)
Potassium: 2.9 mmol/L — ABNORMAL LOW (ref 3.5–5.1)
Potassium: 3.3 mmol/L — ABNORMAL LOW (ref 3.5–5.1)
SODIUM: 136 mmol/L (ref 135–145)
Sodium: 140 mmol/L (ref 135–145)
Sodium: 139 mmol/L (ref 135–145)
Sodium: 140 mmol/L (ref 135–145)

## 2016-01-16 LAB — URINE MICROSCOPIC-ADD ON

## 2016-01-16 LAB — GLUCOSE, CAPILLARY
GLUCOSE-CAPILLARY: 306 mg/dL — AB (ref 65–99)
GLUCOSE-CAPILLARY: 96 mg/dL (ref 65–99)
Glucose-Capillary: 103 mg/dL — ABNORMAL HIGH (ref 65–99)
Glucose-Capillary: 43 mg/dL — CL (ref 65–99)
Glucose-Capillary: 88 mg/dL (ref 65–99)

## 2016-01-16 LAB — I-STAT CHEM 8, ED
BUN: 87 mg/dL — ABNORMAL HIGH (ref 6–20)
CALCIUM ION: 1 mmol/L — AB (ref 1.12–1.23)
Chloride: 93 mmol/L — ABNORMAL LOW (ref 101–111)
Creatinine, Ser: 3.5 mg/dL — ABNORMAL HIGH (ref 0.61–1.24)
GLUCOSE: 536 mg/dL — AB (ref 65–99)
HEMATOCRIT: 58 % — AB (ref 39.0–52.0)
HEMOGLOBIN: 19.7 g/dL — AB (ref 13.0–17.0)
POTASSIUM: 4.2 mmol/L (ref 3.5–5.1)
SODIUM: 137 mmol/L (ref 135–145)
TCO2: 28 mmol/L (ref 0–100)

## 2016-01-16 LAB — LIPASE, BLOOD: LIPASE: 16 U/L (ref 11–51)

## 2016-01-16 LAB — CBC
HEMATOCRIT: 52.2 % — AB (ref 39.0–52.0)
HEMOGLOBIN: 17.1 g/dL — AB (ref 13.0–17.0)
MCH: 26.7 pg (ref 26.0–34.0)
MCHC: 32.8 g/dL (ref 30.0–36.0)
MCV: 81.4 fL (ref 78.0–100.0)
Platelets: 181 10*3/uL (ref 150–400)
RBC: 6.41 MIL/uL — ABNORMAL HIGH (ref 4.22–5.81)
RDW: 18.2 % — ABNORMAL HIGH (ref 11.5–15.5)
WBC: 15.1 10*3/uL — AB (ref 4.0–10.5)

## 2016-01-16 LAB — CBG MONITORING, ED
GLUCOSE-CAPILLARY: 480 mg/dL — AB (ref 65–99)
GLUCOSE-CAPILLARY: 502 mg/dL — AB (ref 65–99)
Glucose-Capillary: 433 mg/dL — ABNORMAL HIGH (ref 65–99)

## 2016-01-16 LAB — I-STAT TROPONIN, ED: TROPONIN I, POC: 0.19 ng/mL — AB (ref 0.00–0.08)

## 2016-01-16 LAB — I-STAT VENOUS BLOOD GAS, ED
Acid-Base Excess: 6 mmol/L — ABNORMAL HIGH (ref 0.0–2.0)
BICARBONATE: 30.8 meq/L — AB (ref 20.0–24.0)
O2 Saturation: 93 %
PH VEN: 7.467 — AB (ref 7.250–7.300)
TCO2: 32 mmol/L (ref 0–100)
pCO2, Ven: 42.6 mmHg — ABNORMAL LOW (ref 45.0–50.0)
pO2, Ven: 63 mmHg — ABNORMAL HIGH (ref 31.0–45.0)

## 2016-01-16 LAB — BRAIN NATRIURETIC PEPTIDE: B Natriuretic Peptide: 626.2 pg/mL — ABNORMAL HIGH (ref 0.0–100.0)

## 2016-01-16 LAB — APTT: APTT: 32 s (ref 24–36)

## 2016-01-16 LAB — MRSA PCR SCREENING: MRSA BY PCR: NEGATIVE

## 2016-01-16 LAB — PROTIME-INR
INR: 1.38
Prothrombin Time: 17.1 seconds — ABNORMAL HIGH (ref 11.4–15.2)

## 2016-01-16 LAB — CARBOXYHEMOGLOBIN
CARBOXYHEMOGLOBIN: 2.4 % — AB (ref 0.5–1.5)
Methemoglobin: 0.7 % (ref 0.0–1.5)
O2 SAT: 86.2 %
Total hemoglobin: 16.2 g/dL (ref 13.5–18.0)

## 2016-01-16 LAB — BETA-HYDROXYBUTYRIC ACID: Beta-Hydroxybutyric Acid: 0.07 mmol/L (ref 0.05–0.27)

## 2016-01-16 LAB — LACTIC ACID, PLASMA
LACTIC ACID, VENOUS: 5 mmol/L — AB (ref 0.5–1.9)
Lactic Acid, Venous: 4.9 mmol/L (ref 0.5–1.9)

## 2016-01-16 MED ORDER — SODIUM CHLORIDE 0.9 % IV SOLN
INTRAVENOUS | Status: DC
  Administered 2016-01-16: 4.2 [IU]/h via INTRAVENOUS
  Filled 2016-01-16 (×2): qty 2.5

## 2016-01-16 MED ORDER — VANCOMYCIN HCL IN DEXTROSE 750-5 MG/150ML-% IV SOLN
750.0000 mg | INTRAVENOUS | Status: DC
  Administered 2016-01-16: 750 mg via INTRAVENOUS
  Filled 2016-01-16: qty 150

## 2016-01-16 MED ORDER — POTASSIUM CHLORIDE 10 MEQ/100ML IV SOLN
10.0000 meq | INTRAVENOUS | Status: AC
  Administered 2016-01-16 (×2): 10 meq via INTRAVENOUS
  Filled 2016-01-16 (×2): qty 100

## 2016-01-16 MED ORDER — SODIUM CHLORIDE 0.9 % IV BOLUS (SEPSIS)
500.0000 mL | Freq: Once | INTRAVENOUS | Status: AC
  Administered 2016-01-16: 500 mL via INTRAVENOUS

## 2016-01-16 MED ORDER — ONDANSETRON HCL 4 MG/2ML IJ SOLN
4.0000 mg | Freq: Four times a day (QID) | INTRAMUSCULAR | Status: DC | PRN
Start: 2016-01-16 — End: 2016-01-24
  Administered 2016-01-21 – 2016-01-24 (×3): 4 mg via INTRAVENOUS
  Filled 2016-01-16 (×4): qty 2

## 2016-01-16 MED ORDER — PIPERACILLIN-TAZOBACTAM IN DEX 2-0.25 GM/50ML IV SOLN
2.2500 g | Freq: Three times a day (TID) | INTRAVENOUS | Status: DC
  Administered 2016-01-16 – 2016-01-17 (×3): 2.25 g via INTRAVENOUS
  Filled 2016-01-16 (×5): qty 50

## 2016-01-16 MED ORDER — SODIUM CHLORIDE 0.9 % IV SOLN
INTRAVENOUS | Status: AC
  Administered 2016-01-16: 16:00:00 via INTRAVENOUS

## 2016-01-16 MED ORDER — ADENOSINE 6 MG/2ML IV SOLN
6.0000 mg | Freq: Once | INTRAVENOUS | Status: DC
  Filled 2016-01-16: qty 2

## 2016-01-16 MED ORDER — ONDANSETRON HCL 4 MG PO TABS: 4.0000 mg | ORAL_TABLET | Freq: Four times a day (QID) | ORAL | Status: DC | PRN

## 2016-01-16 MED ORDER — HEPARIN SODIUM (PORCINE) 5000 UNIT/ML IJ SOLN
5000.0000 [IU] | Freq: Three times a day (TID) | INTRAMUSCULAR | Status: DC
  Administered 2016-01-16 – 2016-01-22 (×18): 5000 [IU] via SUBCUTANEOUS
  Filled 2016-01-16 (×19): qty 1

## 2016-01-16 MED ORDER — POTASSIUM CHLORIDE 10 MEQ/100ML IV SOLN
10.0000 meq | INTRAVENOUS | Status: AC
  Administered 2016-01-16 – 2016-01-17 (×3): 10 meq via INTRAVENOUS
  Filled 2016-01-16 (×3): qty 100

## 2016-01-16 MED ORDER — MILRINONE LACTATE IN DEXTROSE 20-5 MG/100ML-% IV SOLN
0.2500 ug/kg/min | INTRAVENOUS | Status: DC
  Administered 2016-01-16 – 2016-01-24 (×9): 0.25 ug/kg/min via INTRAVENOUS
  Filled 2016-01-16 (×9): qty 100

## 2016-01-16 MED ORDER — DEXTROSE 50 % IV SOLN
INTRAVENOUS | Status: AC
  Administered 2016-01-16: 23 mL
  Filled 2016-01-16: qty 50

## 2016-01-16 NOTE — Progress Notes (Signed)
CRITICAL VALUE ALERT  Critical value received:  Lactic Acid 4.9  Date of notification:  01/16/16  Time of notification:  1913  Critical value read back:Yes.    Nurse who received alert:  Meriel Flavors  MD notified (1st page):  IMTS on call MD.  Time of first page:  2018    Responding MD: IMTS on call MD  Time MD responded:  2020  No new orders at this time.

## 2016-01-16 NOTE — ED Notes (Signed)
CRITICAL VALUE ALERT  Critical value received:  Glucose 528  Date of notification:  01/16/2016  Time of notification:  1145  Critical value read back:Yes.    Nurse who received alert:  Heide Guile  MD notified (1st page):  Dr. Lynelle Doctor  Time of first page:  1200, insulin drip ordered

## 2016-01-16 NOTE — ED Provider Notes (Signed)
[facility]-EMERGENCY DEPT Provider Note   CSN: [id number] Arrival date & time: 01/16/16  1023  First Provider Contact:  First MD Initiated Contact with Patient 01/16/16 1044        History   Chief Complaint Chief Complaint  Patient presents with  . Weakness  . Tachycardia    HPI Gary Gallegos is a 70 y.o. male.   Weakness   family states that the patient has not been feeling well for at least the past week and more severe over the last 3-4 days. She does have a history of severe cardiomyopathy and congestive heart failure. He is chronically on a milrinone drop.  Patient's family recently moved and the patient has not been taking his medications consistently in the last week or 2. Normally the patient is able to walk up several flights of stairs but in the last week he has not been able to do that. He started having episodes of nausea vomiting over the last 3-4 days. May be 3-4 episodes per day. Decreased oral intake. He may have had a couple of loose stools. She denies any trouble with chest pain or abdominal pain. He does feel that his heart is racing. No fevers. No blood in his stool. No chest pain. No shortness of breath.  Past Medical History:  Diagnosis Date  . AICD (automatic cardioverter/defibrillator) present   . Biventricular CHF (congestive heart failure) (HCC)    a. Echo 8/16:  EF 10-15%, diff HK, trivial AI, mild MR, mild LAE, mod RVE, mild reduced RVF, mild TR, PASP 40 mmHg, mild eff without tamponade.   . Blind left eye   . Depression   . GERD (gastroesophageal reflux disease)   . Hyperlipidemia   . Hypertension   . Impaired vision in both eyes   . NICM (nonischemic cardiomyopathy) (HCC)   . Type II diabetes mellitus [facility])     Patient Active Problem List   Diagnosis Date Noted  . Generalized anxiety disorder 07/20/2015  . Impacted cerumen of both ears 07/20/2015  . Diabetic foot ulcer (HCC) 07/20/2015  . Skin bulla 05/31/2015  . DNR (do not resuscitate)  01/22/2015  . Hypokalemia 01/22/2015  . Acute on chronic systolic congestive heart failure, NYHA class 3 (HCC) 01/18/2015  . Acute on chronic Biventricular congestive heart failure 01/18/2015  . Sinus tachycardia (HCC) 09/10/2013  . Palliative care encounter 07/10/2013  . Weakness generalized 07/10/2013  . Cirrhosis (HCC) 07/06/2013  . Liver mass, left lobe 07/06/2013  . Nausea vomiting and diarrhea 07/05/2013  . Dehydration 07/05/2013  . Coffee ground emesis 07/05/2013  . Chronic systolic heart failure (HCC) 06/29/2013  . Cough 02/14/2013  . Type 2 diabetes mellitus, uncontrolled (HCC) 02/14/2013  . HTN (hypertension) 02/14/2013  . Hyperlipidemia 02/14/2013    Past Surgical History:  Procedure Laterality Date  . CARDIAC CATHETERIZATION    . CARDIAC DEFIBRILLATOR PLACEMENT    . ESOPHAGOGASTRODUODENOSCOPY N/A 07/09/2013   Procedure: ESOPHAGOGASTRODUODENOSCOPY (EGD);  Surgeon: Hart Carwin, MD;  Location: [facility] ENDOSCOPY;  Service: Endoscopy;  Laterality: N/A;  . RIGHT HEART CATHETERIZATION N/A 07/08/2013   Procedure: RIGHT HEART CATH;  Surgeon: Laurey Morale, MD;  Location: [facility] CATH LAB;  Service: Cardiovascular;  Laterality: N/A;       Home Medications    Prior to Admission medications   Medication Sig Start Date End Date Taking? Authorizing Provider  aspirin EC 81 MG tablet Take 81 mg by mouth daily.   Yes Historical Provider, MD  carvedilol (COREG) 3.125 MG tablet  TAKE 1 TABLET BY MOUTH TWO TIMES DAILY WITH A MEAL 12/06/15  Yes Dolores Patty, MD  Insulin Glargine (LANTUS) 100 UNIT/ML Solostar Pen Inject 5 Units into the skin daily at 10 pm. 07/20/15  Yes Gust Rung, DO  Insulin Pen Needle 31G X 4 MM MISC 1 Units by Does not apply route daily. 07/20/15  Yes Gust Rung, DO  Magnesium 200 MG TABS Take 200 mg by mouth daily.   Yes Historical Provider, MD  metolazone (ZAROXOLYN) 5 MG tablet Take 1 tablet (5 mg total) by mouth daily as needed (shortness of breath/edema). As  directed by HF clinic. 11/16/15  Yes Graciella Freer, PA-C  pantoprazole (PROTONIX) 20 MG tablet Take 1 tablet (20 mg total) by mouth daily. 07/20/15 07/19/16 Yes Gust Rung, DO  potassium chloride SA (K-DUR,KLOR-CON) 20 MEQ tablet Take 2 tablets (40 mEq total) by mouth 3 (three) times daily. 12/28/15  Yes Graciella Freer, PA-C  sertraline (ZOLOFT) 50 MG tablet Take 1 tablet (50 mg total) by mouth daily. 07/20/15 07/19/16 Yes Gust Rung, DO  simvastatin (ZOCOR) 5 MG tablet Take 1 tablet (5 mg total) by mouth at bedtime. 06/06/15  Yes Amy D Clegg, NP  torsemide (DEMADEX) 20 MG tablet Take 60 mg (3 tabs) in am and 40 mg (2 tabs) in pm. 12/29/15  Yes Graciella Freer, PA-C  milrinone [facility]) 20 MG/100ML SOLN infusion Inject 25.875 mcg/min into the vein continuous. 01/22/15   Beatrice Lecher, PA-C  mirtazapine (REMERON) 15 MG tablet Take 0.5 tablets (7.5 mg total) by mouth at bedtime. Patient not taking: Reported on 11/24/2015 07/20/15   Gust Rung, DO    Family History Family History  Problem Relation Age of Onset  . Heart disease Father     Social History Social History  Substance Use Topics  . Smoking status: Never Smoker  . Smokeless tobacco: Never Used     Comment: "stopped smoking in the 1970's"  . Alcohol use No     Allergies   Penicillin g; Fluorometholone; Penicillins; Sulfa antibiotics; and Sulfamethoxazole-trimethoprim   Review of Systems Review of Systems  Neurological: Positive for weakness.  All other systems reviewed and are negative.    Physical Exam Updated Vital Signs BP 103/86   Pulse (!) 139   Resp 23   Ht 5\' 8"  (1.727 m)   Wt 51.7 kg   SpO2 97%   BMI 17.33 kg/m   Physical Exam  Constitutional: No distress.  Elderly, frail  HENT:  Head: Normocephalic and atraumatic.  Right Ear: External ear normal.  Left Ear: External ear normal.  Mucous membranes are dry  Eyes: Conjunctivae are normal. Right eye exhibits no discharge. Left eye  exhibits no discharge. No scleral icterus.  No scleral icterus,conjunctivae are pink  Neck: Neck supple. No tracheal deviation present.  Cardiovascular: Regular rhythm and intact distal pulses.  Tachycardia present.   Pulmonary/Chest: Effort normal and breath sounds normal. No stridor. No respiratory distress. He has no wheezes. He has no rales.  Abdominal: Soft. Bowel sounds are normal. He exhibits no distension. There is tenderness in the epigastric area. There is no rigidity, no rebound, no guarding and negative Murphy's sign.  Musculoskeletal: He exhibits no edema or tenderness.  Neurological: He is alert. He has normal strength. No cranial nerve deficit (no facial droop, extraocular movements intact, no slurred speech) or sensory deficit. He exhibits normal muscle tone. He displays no seizure activity. Coordination normal.  Skin: Skin  is warm and dry. No rash noted.  Psychiatric: He has a normal mood and affect.  Nursing note and vitals reviewed.    ED Treatments / Results  Labs (all labs ordered are listed, but only abnormal results are displayed) Labs Reviewed  CBC - Abnormal; Notable for the following:       Result Value   WBC 15.1 (*)    RBC 6.41 (*)    Hemoglobin 17.1 (*)    HCT 52.2 (*)    RDW 18.2 (*)    All other components within normal limits  COMPREHENSIVE METABOLIC PANEL - Abnormal; Notable for the following:    Chloride 91 (*)    Glucose, Bld 528 (*)    BUN 105 (*)    Creatinine, Ser 3.67 (*)    Total Protein 8.4 (*)    Alkaline Phosphatase 169 (*)    Total Bilirubin 5.2 (*)    GFR calc non Af Amer 16 (*)    GFR calc Af Amer 18 (*)    Anion gap 19 (*)    All other components within normal limits  PROTIME-INR - Abnormal; Notable for the following:    Prothrombin Time 17.1 (*)    All other components within normal limits  BRAIN NATRIURETIC PEPTIDE - Abnormal; Notable for the following:    B Natriuretic Peptide 626.2 (*)    All other components within normal  limits  I-STAT TROPOININ, ED - Abnormal; Notable for the following:    Troponin i, poc 0.19 (*)    All other components within normal limits  I-STAT CHEM 8, ED - Abnormal; Notable for the following:    Chloride 93 (*)    BUN 87 (*)    Creatinine, Ser 3.50 (*)    Glucose, Bld 536 (*)    Calcium, Ion 1.00 (*)    Hemoglobin 19.7 (*)    HCT 58.0 (*)    All other components within normal limits  APTT  LIPASE, BLOOD  URINALYSIS, ROUTINE W REFLEX MICROSCOPIC (NOT AT [facility])  CARBOXYHEMOGLOBIN  I-STAT VENOUS BLOOD GAS, ED    EKG  EKG Interpretation  Date/Time:  Monday January 16 2016 12:16:43 EDT Ventricular Rate:  136 PR Interval:    QRS Duration: 126 QT Interval:  394 QTC Calculation: 593 R Axis:   70 Text Interpretation:  Sinus or ectopic atrial tachycardia IVCD, consider atypical LBBB Since last tracing rate slower Confirmed by Reana Chacko  MD-J, Alyvia Derk ([id number]) on 01/16/2016 12:21:15 PM      Initial ECG showed heart rate of 160s  Radiology Dg Chest Portable 1 View  Result Date: 01/16/2016 CLINICAL DATA:  Tachycardia EXAM: PORTABLE CHEST 1 VIEW COMPARISON:  January 19, 2015 FINDINGS: Central catheter tip is at the cavoatrial junction. Pacemaker lead is attached to the right ventricle. No pneumothorax. No edema or consolidation. Heart is upper normal in size with pulmonary vascularity within normal limits. No adenopathy. No bone lesions peer IMPRESSION: Central catheter tip at cavoatrial junction. No pneumothorax. No edema or consolidation. Heart upper normal in size with pulmonary vascularity within normal limits. Electronically Signed   By: Bretta Bang III M.D.   On: 01/16/2016 11:18   Procedures .Critical Care Performed by: Linwood Dibbles Authorized by: Linwood Dibbles   Critical care provider statement:    Critical care time (minutes):  45   Critical care was necessary to treat or prevent imminent or life-threatening deterioration of the following conditions:  Cardiac failure, endocrine  crisis and shock   Critical care was  time spent personally by me on the following activities:  Blood draw for specimens, discussions with consultants, evaluation of patient's response to treatment, examination of patient, ordering and performing treatments and interventions, ordering and review of laboratory studies, ordering and review of radiographic studies, pulse oximetry and re-evaluation of patient's condition   (including critical care time)  Medications Ordered in ED Medications  insulin regular (NOVOLIN R,HUMULIN R) 250 Units in sodium chloride 0.9 % 250 mL (1 Units/mL) infusion (not administered)  adenosine (ADENOCARD) 6 MG/2ML injection 6 mg (0 mg Intravenous Hold 01/16/16 1220)  sodium chloride 0.9 % bolus 500 mL (0 mLs Intravenous Stopped 01/16/16 1152)  sodium chloride 0.9 % bolus 500 mL (500 mLs Intravenous New Bag/Given 01/16/16 1152)     Initial Impression / Assessment and Plan / ED Course  I have reviewed the triage vital signs and the nursing notes.  Pertinent labs & imaging results that were available during my care of the patient were reviewed by me and considered in my medical decision making (see chart for details).  Clinical Course  Comment By Time  The patient is tachycardic in the emergency room.  Unclear if this EKG is showing SVT versus sinus tachycardia versus flutter with 2-1 block.Patient has had trouble with nausea and vomiting. He does appear to be clinically dehydrated. I will start with a fluid infusion and consult with cardiology. Linwood Dibbles, MD 07/31 1117  Will need to consider cardioversion although family states he is chronically hypotensive.  This may not be too far from baseline Linwood Dibbles, MD 07/31 1119  BP is improving with fluids.  Discussed ECG with Dr Elease Hashimoto.  Will try adenosine.  Labs show renal failure and hyperglycemia.  Possible DKA .  HGB increased.  Probable dehydration Linwood Dibbles, MD 07/31 1143  Was going to give adenosine but heart rate is now  in the 130s.  Repeat ECG suggestive of sinus tach now.  Suspect sx from dehydration. Linwood Dibbles, MD 07/31 1221   Patient's laboratory tests are suggestive of severe dehydration, new acute renal failure, hyperglycemia with diabetic ketoacidosis. Patient also presented very tachycardic and has a slightly elevated troponin. He is on a chronic milrinone drip. I spoke with Dr. Elease Hashimoto. He will ask the CHF team to evaluate the patient  The patient's vital signs are improving with hydration. The patient is feeling better. SPECT his DKA could be related to medicine noncompliance.I will consult the medical service for admission.patient will likely require stepdown admission and close evaluation.  Final Clinical Impressions(s) / ED Diagnoses   Final diagnoses:  Diabetic ketoacidosis without coma associated with type 1 diabetes mellitus (HCC)  Dehydration  Renal failure  Elevated troponin    New Prescriptions New Prescriptions   No medications on file     Linwood Dibbles, MD 01/16/16 1225

## 2016-01-16 NOTE — Progress Notes (Signed)
I spoke with the patient's daughter [name] who the patient identified as his surrogate decision-maker. Per discussions she has had with the patient, his goals are in line with a DNR code status which is consistent with the last two admissions.   Full H&P to follow.

## 2016-01-16 NOTE — ED Triage Notes (Addendum)
Pt has CHF, not been taking meds diligently lately due to recent move. Pt having increased weakness, SOB, "wet sounding lungs" according to family. Pt alert, but tired in bed. Pt was able to walk up three flights of stairs a few weeks ago but is now not able to tolerate a short walk. Pt's HR 150's upon arrival to ED and systolic BP 80's. Pt's family also states he has been vomiting for the past 3-4 days. Decreased food intake/urine output.

## 2016-01-16 NOTE — H&P (Signed)
Date: 01/16/2016               Patient Name:  Gary Gallegos MRN: [id number]  DOB: September 23, 1945 Age / Sex: 70 y.o., male   PCP: Fuller Plan, MD         Medical Service: Internal Medicine Teaching Service         Attending Physician: Dr. Burns Spain, MD    First Contact: Dr. Marinda Elk Pager: [phone]  Second Contact: Dr. Allena Katz  Pager: [phone]       After Hours (After 5p/  First Contact Pager: [phone]  weekends / holidays): Second Contact Pager: [phone]   Chief Complaint: Nausea/Vomiting  History of Present Illness: [name] is a 70 yo male with PMHx of type II DM, systolic CHF with ICD placement and HTN that presents to the ED due to physical decline as noted per daughter's history.  Patient was unable to state his location, year or month.  Most of history was obtained per ED notes and daughter from phone discussion.  [name] has had a decline in the past 2 weeks.  Prior to the decline patient was able to walk up several flights of stairs and attend to his daily needs.   Daughter thought patient was fluid overloaded and began to fluid restrict.  The patient has not been able to produce urine in the past 2 days and is now not able to walk or do his regular activities of daily living.  Due to a recent move he has not been taking his medications regularly in the past 1-2weeks.   Patient does endorse nausea and vomiting along with abdominal pain.  Patient denies SOB or chest pain.     Meds:  Prescriptions Prior to Admission  Medication Sig Dispense Refill Last Dose  . aspirin EC 81 MG tablet Take 81 mg by mouth daily.   01/15/2016 at Unknown time  . carvedilol (COREG) 3.125 MG tablet TAKE 1 TABLET BY MOUTH TWO TIMES DAILY WITH A MEAL 60 tablet 3 01/15/2016 at 2130  . Insulin Glargine (LANTUS) 100 UNIT/ML Solostar Pen Inject 5 Units into the skin daily at 10 pm. 15 mL 11 01/15/2016 at Unknown time  . Insulin Pen Needle 31G X 4 MM MISC 1 Units by Does not apply route daily.  100 each 3 Taking  . Magnesium 200 MG TABS Take 200 mg by mouth daily.   01/15/2016 at Unknown time  . metolazone (ZAROXOLYN) 5 MG tablet Take 1 tablet (5 mg total) by mouth daily as needed (shortness of breath/edema). As directed by HF clinic.   01/11/2016 at Unknown time  . pantoprazole (PROTONIX) 20 MG tablet Take 1 tablet (20 mg total) by mouth daily. 90 tablet 3 Past Week at Unknown time  . potassium chloride SA (K-DUR,KLOR-CON) 20 MEQ tablet Take 2 tablets (40 mEq total) by mouth 3 (three) times daily.   01/15/2016 at Unknown time  . sertraline (ZOLOFT) 50 MG tablet Take 1 tablet (50 mg total) by mouth daily. 90 tablet 3 01/15/2016 at Unknown time  . simvastatin (ZOCOR) 5 MG tablet Take 1 tablet (5 mg total) by mouth at bedtime. 30 tablet 6 01/15/2016 at Unknown time  . torsemide (DEMADEX) 20 MG tablet Take 60 mg (3 tabs) in am and 40 mg (2 tabs) in pm. 150 tablet 6 01/15/2016 at Unknown time  . milrinone (PRIMACOR) 20 MG/100ML SOLN infusion Inject 25.875 mcg/min into the vein continuous. 100 mL 999 continuous  .  mirtazapine (REMERON) 15 MG tablet Take 0.5 tablets (7.5 mg total) by mouth at bedtime. (Patient not taking: Reported on 11/24/2015) 45 tablet 1 Not Taking     Allergies: Allergies as of 01/16/2016 - Review Complete 01/16/2016  Allergen Reaction Noted  . Penicillin g Hives 01/18/2015  . Fluorometholone Rash 01/18/2015  . Penicillins Nausea And Vomiting 02/13/2013  . Sulfa antibiotics Nausea And Vomiting 02/13/2013  . Sulfamethoxazole-trimethoprim Nausea Only 01/18/2015   Past Medical History:  Diagnosis Date  . AICD (automatic cardioverter/defibrillator) present   . Biventricular CHF (congestive heart failure) (HCC)    a. Echo 8/16:  EF 10-15%, diff HK, trivial AI, mild MR, mild LAE, mod RVE, mild reduced RVF, mild TR, PASP 40 mmHg, mild eff without tamponade.   . Blind left eye   . Depression   . GERD (gastroesophageal reflux disease)   . Hyperlipidemia   . Hypertension   .  Impaired vision in both eyes   . NICM (nonischemic cardiomyopathy) (HCC)   . Type II diabetes mellitus (HCC)     Family History:  Family History  Problem Relation Age of Onset  . Heart disease Father      Social History: Patient currently does not smoke or consume alcohol.  His daughter is his caregiver.    Review of Systems: A complete ROS was negative except as per HPI.   Physical Exam: Blood pressure 92/69, pulse (!) 130, temperature 97.2 F (36.2 C), temperature source Oral, resp. rate 20, height 5\' 8"  (1.727 m), weight 112 lb (50.8 kg), SpO2 96 %. Physical Exam  Constitutional:  Underweight  HENT:  Head: Normocephalic and atraumatic.  Blind in left eye  Eyes: Pupils are equal, round, and reactive to light.  Cardiovascular: Normal rate, regular rhythm and normal heart sounds.   Pulmonary/Chest: Effort normal and breath sounds normal. No respiratory distress.  Abdominal: Soft. He exhibits no distension. There is no tenderness.  Musculoskeletal: He exhibits no edema.  ICD on left side of chest  Neurological:  CN II-XII intact Motor strength 5/5 in upper and lower extremity   No asterixis noted  Skin:  Left hand 3rd digit near DIP joint, puss filed dime size area    EKG: Sinus tachycardia  CXR:  Central catheter tip is at the cavoatrial junction. Pacemaker lead is attached to the right ventricle. No pneumothorax. No edema or consolidation. Heart is upper normal in size with pulmonary vascularity within normal limits. No adenopathy. No bone lesions peer IMPRESSION: Central catheter tip at cavoatrial junction. No pneumothorax. No edema or consolidation. Heart upper normal in size with pulmonary vascularity within normal limits.  Assessment & Plan by Problem: Uremia/Azotemia BUN 105 and creatinine 3.67.  Patient has been having nausea and vomiting in the past 3-4 days.  Patient has been fluid restricted and this may account for the increase in BUN and Creatinine.  Baseline creatinine is 1.2. Anion gap of 19 likely due to uremia.     - Patient has currently received 1L NS - Lactic acid - Zofran  - BMET Q4  Leukocytosis WBC 15.1, no fevers.  Patient has PICC line and ICD placement, maybe possible sources for infection.  Also has paronychia of left hand 3rd digit puss filled area that is contained, no drainage.  Chest X ray showed no acute infectious process.  Leukocytosis possibly due to hemoconcentration.   -may ned to lance 3rd digit puss filled area - Vanc/Zosyn - IV fluids -Repeat CBC  Polycythemia  Possibly due to hemoconcentration -  IV fluids - Repeat CBC  Systolic CHF LV EF 20%-25% with ICD placement  - Holding lasix and coreg  DVT prophylaxis - Heparin SQ 5,000 units  HTN Patient is on milrinone drip.  BP 83/60. - Holding home BP medications - NS bolus - continue milrinone drip if further hypotensive may need to hold milrinone drip or consider dobutamine drip in ICU.     Type II DM Glucose 536 and anion gap 19.  Urine sample could not be collected.  Obtaining serum ketones. Placed on insulin drip on admission for possible DKA.  May need to be switched to home insulin if serum ketones are negative. -Serum ketones   Dispo: Admit patient to Inpatient with expected length of stay greater than 2 midnights.  Signed: Camelia Phenes, DO 01/16/2016, 5:34 PM  Pager: [phone]

## 2016-01-16 NOTE — H&P (Deleted)
Advanced Heart Failure Team History and Physical Note  Referring Provider: Dr. Lynelle Doctor Primary Physician:  Pincus Badder, MD Primary Cardiologist:  Dr. [name]   Reason for Admission: A/C systolic HF   HPI:    Gary Gallegos is a 70 y.o. male followed in the HF clinic on chronic milrinone. He has PMH of DM2 with severe diabetic retinopathy, legally blind, HTN, HLD, and chronic systolic HF due to NICM (EF 10%).   Was last seen in HF clinic 11/24/15 and thought to be stable.  Weight 143 lbs at that time.   Recently held diuretics for 2 days with worsening Creatinine. Have also continually made adjustments to his potassium with questionable compliance.   He presented to the [facility] today with worsening DOE, N/V, decreased appetite, ? Loose stools. He states he has not been taking his medications consistently in the last 1-2 weeks with the family moving.  Pertinent labs on admission include K 4.2 ,Creatinine 3.5 (up from 2.1 on 12/29/15), BNP 626.2, WBC 15.1, Troponin 0.19. CXR with stable PICC. No edema or consolidation.  Of note his Glucose was elevated to > 500.   Pressures in ED with SBPs from 70-100s. HR in 130 up to 150s.  States he has been feeling worse over the past few days.  N/V + diarrhea per family.  No fever.  Chills per patient.  States he takes his medications "here and there". Very fatigued.  Has obvious abscess on Left middle finger  Review of Systems: [y] = yes,  = no   General: Weight gain ; Weight loss [y]; Anorexia ; Fatigue [y]; Fever ; Chills [y]; Weakness   Cardiac: Chest pain/pressure ; Resting SOB ; Exertional SOB ; Orthopnea ; Pedal Edema ; Palpitations ; Syncope ; Presyncope ; Paroxysmal nocturnal dyspnea[ ]   Pulmonary: Cough ; Wheezing[ ] ; Hemoptysis[ ] ; Sputum ; Snoring   GI: Vomiting[y]; Dysphagia[ ] ; Melena[ ] ; Hematochezia ; Heartburn[ ] ; Abdominal pain ; Constipation ; Diarrhea [y]; BRBPR   GU:  Hematuria[ ] ; Dysuria ; Nocturia[ ]   Vascular: Pain in legs with walking ; Pain in feet with lying flat ; Non-healing sores ; Stroke ; TIA ; Slurred speech ;  Neuro: Headaches[ ] ; Vertigo[ ] ; Seizures[ ] ; Paresthesias[ ] ;Blurred vision ; Diplopia ; Vision changes   Ortho/Skin: Arthritis [y]; Joint pain [y]; Muscle pain ; Joint swelling ; Back Pain ; Rash   Psych: Depression[ ] ; Anxiety[ ]   Heme: Bleeding problems ; Clotting disorders ; Anemia   Endocrine: Diabetes ; Thyroid dysfunction[ ]    Home Medications Prior to Admission medications   Medication Sig Start Date End Date Taking? Authorizing Provider  aspirin EC 81 MG tablet Take 81 mg by mouth daily.   Yes Historical Provider, MD  carvedilol (COREG) 3.125 MG tablet TAKE 1 TABLET BY MOUTH TWO TIMES DAILY WITH A MEAL 12/06/15  Yes Dolores Patty, MD  Insulin Glargine (LANTUS) 100 UNIT/ML Solostar Pen Inject 5 Units into the skin daily at 10 pm. 07/20/15  Yes Gust Rung, DO  Insulin Pen Needle 31G X 4 MM MISC 1 Units by Does not apply route daily. 07/20/15  Yes Gust Rung, DO  Magnesium 200 MG TABS Take 200 mg by mouth daily.   Yes Historical Provider, MD  metolazone (  ZAROXOLYN) 5 MG tablet Take 1 tablet (5 mg total) by mouth daily as needed (shortness of breath/edema). As directed by HF clinic. 11/16/15  Yes Graciella Freer, PA-C  pantoprazole (PROTONIX) 20 MG tablet Take 1 tablet (20 mg total) by mouth daily. 07/20/15 07/19/16 Yes Gust Rung, DO  potassium chloride SA (K-DUR,KLOR-CON) 20 MEQ tablet Take 2 tablets (40 mEq total) by mouth 3 (three) times daily. 12/28/15  Yes Graciella Freer, PA-C  sertraline (ZOLOFT) 50 MG tablet Take 1 tablet (50 mg total) by mouth daily. 07/20/15 07/19/16 Yes Gust Rung, DO  simvastatin (ZOCOR) 5 MG tablet Take 1 tablet (5 mg total) by mouth at bedtime. 06/06/15  Yes Amy D Clegg, NP  torsemide (DEMADEX) 20 MG tablet Take 60 mg (3 tabs)  in am and 40 mg (2 tabs) in pm. 12/29/15  Yes Graciella Freer, PA-C  milrinone [facility]) 20 MG/100ML SOLN infusion Inject 25.875 mcg/min into the vein continuous. 01/22/15   Beatrice Lecher, PA-C  mirtazapine (REMERON) 15 MG tablet Take 0.5 tablets (7.5 mg total) by mouth at bedtime. Patient not taking: Reported on 11/24/2015 07/20/15   Gust Rung, DO    Past Medical History: Past Medical History:  Diagnosis Date  . AICD (automatic cardioverter/defibrillator) present   . Biventricular CHF (congestive heart failure) (HCC)    a. Echo 8/16:  EF 10-15%, diff HK, trivial AI, mild MR, mild LAE, mod RVE, mild reduced RVF, mild TR, PASP 40 mmHg, mild eff without tamponade.   . Blind left eye   . Depression   . GERD (gastroesophageal reflux disease)   . Hyperlipidemia   . Hypertension   . Impaired vision in both eyes   . NICM (nonischemic cardiomyopathy) (HCC)   . Type II diabetes mellitus (HCC)     Past Surgical History: Past Surgical History:  Procedure Laterality Date  . CARDIAC CATHETERIZATION    . CARDIAC DEFIBRILLATOR PLACEMENT    . ESOPHAGOGASTRODUODENOSCOPY N/A 07/09/2013   Procedure: ESOPHAGOGASTRODUODENOSCOPY (EGD);  Surgeon: Hart Carwin, MD;  Location: [facility] ENDOSCOPY;  Service: Endoscopy;  Laterality: N/A;  . RIGHT HEART CATHETERIZATION N/A 07/08/2013   Procedure: RIGHT HEART CATH;  Surgeon: Laurey Morale, MD;  Location: [facility] CATH LAB;  Service: Cardiovascular;  Laterality: N/A;    Family History:  Family History  Problem Relation Age of Onset  . Heart disease Father     Social History: Social History   Social History  . Marital status: Widowed    Spouse name: N/A  . Number of children: N/A  . Years of education: N/A   Social History Main Topics  . Smoking status: Never Smoker  . Smokeless tobacco: Never Used     Comment: "stopped smoking in the 1970's"  . Alcohol use No  . Drug use: No  . Sexual activity: No   Other Topics Concern  . None   Social  History Narrative  . None    Allergies:  Allergies  Allergen Reactions  . Penicillin G Hives  . Fluorometholone Rash    Eye drop -> rash  . Penicillins Nausea And Vomiting  . Sulfa Antibiotics Nausea And Vomiting  . Sulfamethoxazole-Trimethoprim Nausea Only    Objective:    Vital Signs:   Pulse Rate:  [141-156] 141 (07/31 1145) Resp:  [22-29] 22 (07/31 1145) BP: (77-98)/(49-79) 86/67 (07/31 1145) SpO2:  [96 %-100 %] 99 % (07/31 1145) Weight:  [114 lb (51.7 kg)] 114 lb (51.7 kg) (07/31 1056)  Filed Weights   01/16/16 1056  Weight: 114 lb (51.7 kg)    Physical Exam: General:  Chronically ill and elderly appearing.  HEENT: normal Neck: supple. JVD does not appear elevated.. Carotids 2+ bilat; no bruits. No lymphadenopathy or thyromegaly appreciated. Cor: PMI laterally displaced. Regular. No rubs. + S3; +3/6 systolic murmur  Tunneled PICC in R upper chest. Dressing CDI Lungs: Clear Abdomen: soft, NT, ND, no HSM. No bruits or masses. +BS  Extremities: no cyanosis, clubbing, rash, edema.  Left hand with abscess around Left middle finger nail. Neuro: alert & oriented x 3, cranial nerves grossly intact. moves all 4 extremities w/o difficulty. Affect pleasant.  Telemetry: Reviewed personally, S-TACH 130s  Labs: Basic Metabolic Panel:  Recent Labs Lab 01/16/16 1036 01/16/16 1124  NA 135 137  K 4.3 4.2  CL 91* 93*  CO2 25  --   GLUCOSE 528* 536*  BUN 105* 87*  CREATININE 3.67* 3.50*  CALCIUM 9.8  --     Liver Function Tests:  Recent Labs Lab 01/16/16 1036  AST 31  ALT 20  ALKPHOS 169*  BILITOT 5.2*  PROT 8.4*  ALBUMIN 3.7    Recent Labs Lab 01/16/16 1036  LIPASE 16   No results for input(s): AMMONIA in the last 168 hours.  CBC:  Recent Labs Lab 01/16/16 1036 01/16/16 1124  WBC 15.1*  --   HGB 17.1* 19.7*  HCT 52.2* 58.0*  MCV 81.4  --   PLT 181  --     Cardiac Enzymes: No results for input(s): CKTOTAL, CKMB, CKMBINDEX, TROPONINI in the  last 168 hours.  BNP: BNP (last 3 results)  Recent Labs  01/18/15 1418 01/16/16 1036  BNP 1,896.9* 626.2*    ProBNP (last 3 results) No results for input(s): PROBNP in the last 8760 hours.   CBG: No results for input(s): GLUCAP in the last 168 hours.  Coagulation Studies:  Recent Labs  01/16/16 1036  LABPROT 17.1*  INR 1.38    Other results: EKG: S Tach 140s  Imaging: Dg Chest Portable 1 View  Result Date: 01/16/2016 CLINICAL DATA:  Tachycardia EXAM: PORTABLE CHEST 1 VIEW COMPARISON:  January 19, 2015 FINDINGS: Central catheter tip is at the cavoatrial junction. Pacemaker lead is attached to the right ventricle. No pneumothorax. No edema or consolidation. Heart is upper normal in size with pulmonary vascularity within normal limits. No adenopathy. No bone lesions peer IMPRESSION: Central catheter tip at cavoatrial junction. No pneumothorax. No edema or consolidation. Heart upper normal in size with pulmonary vascularity within normal limits. Electronically Signed   By: Bretta Bang III M.D.   On: 01/16/2016 11:18     Assessment:   1. Acute on chronic biventricular HF - On chronic milrinone. 2. Possible line sepsis 3. Diabetes with diabetic retinopathy 4. Sinus tachycardia 5. Fingernail infection  Plan/Discussion:    Concern for recurrent low output vs sepsis from fingernail vs line sepsis.  He is dehydrated on exam and down ~30 lbs from last clinic visit. Hold Diuretics  Remains tachy in 130s.  Improved from 160s with IVF.  Continue IVF. Will watch closely for volume retention.   Continue milrinone 0.25 mcg/kg/min.  Check Coox now and daily.   Will have hospitalist admit for possible line sepsis. Will pan culture.  Will also need finger abscess addressed.   Should be admitted to [facility].    Length of Stay: 0   Graciella Freer, PA-C 01/16/2016, 12:12 PM  Advanced Heart Failure Team Pager [phone] (  M-F; 7a - 4p)  Please contact [facility]  for night-coverage after hours (4p -7a ) and weekends on [email or web address]

## 2016-01-16 NOTE — ED Notes (Signed)
Pt has condom cath on at this time. Unable to provide urine sample

## 2016-01-16 NOTE — ED Notes (Signed)
Attempted report x1. 

## 2016-01-16 NOTE — Consult Note (Signed)
Advanced Heart Failure Team Consult Note  Referring Provider: Dr. Lynelle Doctor Primary Physician:  Pincus Badder, MD Primary Cardiologist:  Dr. [name]   Reason for Admission: A/C systolic HF   HPI:    Gary Gallegos is a 70 y.o. male followed in the HF clinic on chronic milrinone. He has PMH of DM2 with severe diabetic retinopathy, legally blind, HTN, HLD, and chronic systolic HF due to NICM (EF 10%).   Was last seen in HF clinic 11/24/15 and thought to be stable.  Weight 143 lbs at that time.   Recently held diuretics for 2 days with worsening Creatinine. Have also continually made adjustments to his potassium with questionable compliance.   He presented to the [facility] today with worsening DOE, N/V, decreased appetite, ? Loose stools. He states he has not been taking his medications consistently in the last 1-2 weeks with the family moving.  Pertinent labs on admission include K 4.2 ,Creatinine 3.5 (up from 2.1 on 12/29/15), BNP 626.2, WBC 15.1, Troponin 0.19. CXR with stable PICC. No edema or consolidation.  Of note his Glucose was elevated to > 500.   Pressures in ED with SBPs from 70-100s. HR in 130 up to 150s.  States he has been feeling worse over the past few days.  N/V + diarrhea per family.  No fever.  Chills per patient.  States he takes his medications "here and there". Very fatigued.  Has obvious abscess on Left middle finger  Review of Systems: [y] = yes, [ ]  = no   General: Weight gain [ ] ; Weight loss [y]; Anorexia [ ] ; Fatigue [y]; Fever [ ] ; Chills [y]; Weakness [ ]   Cardiac: Chest pain/pressure [ ] ; Resting SOB [ ] ; Exertional SOB [ ] ; Orthopnea [ ] ; Pedal Edema [ ] ; Palpitations [ ] ; Syncope [ ] ; Presyncope [ ] ; Paroxysmal nocturnal dyspnea[ ]   Pulmonary: Cough [ ] ; Wheezing[ ] ; Hemoptysis[ ] ; Sputum [ ] ; Snoring [ ]   GI: Vomiting[y]; Dysphagia[ ] ; Melena[ ] ; Hematochezia [ ] ; Heartburn[ ] ; Abdominal pain [ ] ; Constipation [ ] ; Diarrhea [y]; BRBPR [ ]   GU: Hematuria[ ] ;  Dysuria [ ] ; Nocturia[ ]   Vascular: Pain in legs with walking [ ] ; Pain in feet with lying flat [ ] ; Non-healing sores [ ] ; Stroke [ ] ; TIA [ ] ; Slurred speech [ ] ;  Neuro: Headaches[ ] ; Vertigo[ ] ; Seizures[ ] ; Paresthesias[ ] ;Blurred vision [ ] ; Diplopia [ ] ; Vision changes [ ]   Ortho/Skin: Arthritis [y]; Joint pain [y]; Muscle pain [ ] ; Joint swelling [ ] ; Back Pain [ ] ; Rash [ ]   Psych: Depression[ ] ; Anxiety[ ]   Heme: Bleeding problems [ ] ; Clotting disorders [ ] ; Anemia [ ]   Endocrine: Diabetes [ ] ; Thyroid dysfunction[ ]    Home Medications Prior to Admission medications   Medication Sig Start Date End Date Taking? Authorizing Provider  aspirin EC 81 MG tablet Take 81 mg by mouth daily.   Yes Historical Provider, MD  carvedilol (COREG) 3.125 MG tablet TAKE 1 TABLET BY MOUTH TWO TIMES DAILY WITH A MEAL 12/06/15  Yes Dolores Patty, MD  Insulin Glargine (LANTUS) 100 UNIT/ML Solostar Pen Inject 5 Units into the skin daily at 10 pm. 07/20/15  Yes Gust Rung, DO  Insulin Pen Needle 31G X 4 MM MISC 1 Units by Does not apply route daily. 07/20/15  Yes Gust Rung, DO  Magnesium 200 MG TABS Take 200 mg by mouth daily.   Yes Historical Provider, MD  metolazone (ZAROXOLYN) 5  MG tablet Take 1 tablet (5 mg total) by mouth daily as needed (shortness of breath/edema). As directed by HF clinic. 11/16/15  Yes Graciella Freer, PA-C  pantoprazole (PROTONIX) 20 MG tablet Take 1 tablet (20 mg total) by mouth daily. 07/20/15 07/19/16 Yes Gust Rung, DO  potassium chloride SA (K-DUR,KLOR-CON) 20 MEQ tablet Take 2 tablets (40 mEq total) by mouth 3 (three) times daily. 12/28/15  Yes Graciella Freer, PA-C  sertraline (ZOLOFT) 50 MG tablet Take 1 tablet (50 mg total) by mouth daily. 07/20/15 07/19/16 Yes Gust Rung, DO  simvastatin (ZOCOR) 5 MG tablet Take 1 tablet (5 mg total) by mouth at bedtime. 06/06/15  Yes Amy D Clegg, NP  torsemide (DEMADEX) 20 MG tablet Take 60 mg (3 tabs) in am and 40 mg  (2 tabs) in pm. 12/29/15  Yes Graciella Freer, PA-C  milrinone [facility]) 20 MG/100ML SOLN infusion Inject 25.875 mcg/min into the vein continuous. 01/22/15   Beatrice Lecher, PA-C  mirtazapine (REMERON) 15 MG tablet Take 0.5 tablets (7.5 mg total) by mouth at bedtime. Patient not taking: Reported on 11/24/2015 07/20/15   Gust Rung, DO    Past Medical History: Past Medical History:  Diagnosis Date  . AICD (automatic cardioverter/defibrillator) present   . Biventricular CHF (congestive heart failure) (HCC)    a. Echo 8/16:  EF 10-15%, diff HK, trivial AI, mild MR, mild LAE, mod RVE, mild reduced RVF, mild TR, PASP 40 mmHg, mild eff without tamponade.   . Blind left eye   . Depression   . GERD (gastroesophageal reflux disease)   . Hyperlipidemia   . Hypertension   . Impaired vision in both eyes   . NICM (nonischemic cardiomyopathy) (HCC)   . Type II diabetes mellitus (HCC)     Past Surgical History: Past Surgical History:  Procedure Laterality Date  . CARDIAC CATHETERIZATION    . CARDIAC DEFIBRILLATOR PLACEMENT    . ESOPHAGOGASTRODUODENOSCOPY N/A 07/09/2013   Procedure: ESOPHAGOGASTRODUODENOSCOPY (EGD);  Surgeon: Hart Carwin, MD;  Location: [facility] ENDOSCOPY;  Service: Endoscopy;  Laterality: N/A;  . RIGHT HEART CATHETERIZATION N/A 07/08/2013   Procedure: RIGHT HEART CATH;  Surgeon: Laurey Morale, MD;  Location: [facility] CATH LAB;  Service: Cardiovascular;  Laterality: N/A;    Family History:  Family History  Problem Relation Age of Onset  . Heart disease Father     Social History: Social History   Social History  . Marital status: Widowed    Spouse name: N/A  . Number of children: N/A  . Years of education: N/A   Social History Main Topics  . Smoking status: Never Smoker  . Smokeless tobacco: Never Used     Comment: "stopped smoking in the 1970's"  . Alcohol use No  . Drug use: No  . Sexual activity: No   Other Topics Concern  . None   Social History Narrative  .  None    Allergies:  Allergies  Allergen Reactions  . Penicillin G Hives  . Fluorometholone Rash    Eye drop -> rash  . Penicillins Nausea And Vomiting  . Sulfa Antibiotics Nausea And Vomiting  . Sulfamethoxazole-Trimethoprim Nausea Only    Objective:    Vital Signs:   Pulse Rate:  [141-156] 141 (07/31 1145) Resp:  [22-29] 22 (07/31 1145) BP: (77-98)/(49-79) 86/67 (07/31 1145) SpO2:  [96 %-100 %] 99 % (07/31 1145) Weight:  [114 lb (51.7 kg)] 114 lb (51.7 kg) (07/31 1056)   [employer]  01/16/16 1056  Weight: 114 lb (51.7 kg)    Physical Exam: General:  Chronically ill and elderly appearing.  HEENT: normal Neck: supple. JVD does not appear elevated.. Carotids 2+ bilat; no bruits. No lymphadenopathy or thyromegaly appreciated. Cor: PMI laterally displaced. Regular. No rubs. + S3; +3/6 systolic murmur  Tunneled PICC in R upper chest. Dressing CDI Lungs: Clear Abdomen: soft, NT, ND, no HSM. No bruits or masses. +BS  Extremities: no cyanosis, clubbing, rash, edema.  Left hand with abscess around Left middle finger nail. Neuro: alert & oriented x 3, cranial nerves grossly intact. moves all 4 extremities w/o difficulty. Affect pleasant.  Telemetry: Reviewed personally, S-TACH 130s  Labs: Basic Metabolic Panel:  Recent Labs Lab 01/16/16 1036 01/16/16 1124  NA 135 137  K 4.3 4.2  CL 91* 93*  CO2 25  --   GLUCOSE 528* 536*  BUN 105* 87*  CREATININE 3.67* 3.50*  CALCIUM 9.8  --     Liver Function Tests:  Recent Labs Lab 01/16/16 1036  AST 31  ALT 20  ALKPHOS 169*  BILITOT 5.2*  PROT 8.4*  ALBUMIN 3.7    Recent Labs Lab 01/16/16 1036  LIPASE 16   No results for input(s): AMMONIA in the last 168 hours.  CBC:  Recent Labs Lab 01/16/16 1036 01/16/16 1124  WBC 15.1*  --   HGB 17.1* 19.7*  HCT 52.2* 58.0*  MCV 81.4  --   PLT 181  --     Cardiac Enzymes: No results for input(s): CKTOTAL, CKMB, CKMBINDEX, TROPONINI in the last 168  hours.  BNP: BNP (last 3 results)  Recent Labs  01/18/15 1418 01/16/16 1036  BNP 1,896.9* 626.2*    ProBNP (last 3 results) No results for input(s): PROBNP in the last 8760 hours.   CBG: No results for input(s): GLUCAP in the last 168 hours.  Coagulation Studies:  Recent Labs  01/16/16 1036  LABPROT 17.1*  INR 1.38    Other results: EKG: S Tach 140s  Imaging: Dg Chest Portable 1 View  Result Date: 01/16/2016 CLINICAL DATA:  Tachycardia EXAM: PORTABLE CHEST 1 VIEW COMPARISON:  January 19, 2015 FINDINGS: Central catheter tip is at the cavoatrial junction. Pacemaker lead is attached to the right ventricle. No pneumothorax. No edema or consolidation. Heart is upper normal in size with pulmonary vascularity within normal limits. No adenopathy. No bone lesions peer IMPRESSION: Central catheter tip at cavoatrial junction. No pneumothorax. No edema or consolidation. Heart upper normal in size with pulmonary vascularity within normal limits. Electronically Signed   By: Bretta Bang III M.D.   On: 01/16/2016 11:18     Assessment:   1. Acute on chronic biventricular HF - On chronic milrinone. 2. Possible line sepsis 3. Diabetes with diabetic retinopathy 4. Sinus tachycardia 5. Fingernail infection  Plan/Discussion:    Concern for recurrent low output vs sepsis from fingernail vs line sepsis.  He is dehydrated on exam and down ~30 lbs from last clinic visit. Hold Diuretics  Remains tachy in 130s.  Improved from 160s with IVF.  Continue IVF. Will watch closely for volume retention.   Continue milrinone 0.25 mcg/kg/min.  Check Coox now and daily.   Will have hospitalist admit for diabetes management and possible line sepsis. Will pan culture.  Will also need finger abscess addressed.   Should be admitted to [facility]  Will -place on broad spectrum ABX for now.    Length of Stay: 0   Graciella Freer, [place] 01/16/2016, 12:12  PM  Advanced Heart Failure  Team Pager [phone] (M-F; 7a - 4p)  Please contact [facility] for night-coverage after hours (4p -7a ) and weekends on [email or web address]  Patient seen with PA, agree with the above notes.  He has had nausea/vomiting, poor po intake for several days now.  Not taking his meds regularly.  Came to ER, CXR without PNA or edema, BUN/creatinine up considerably, WBCs up.  1. Hypotension: Suspect combination of baseline cardiac dysfunction + dehydration and ?component of septic shock with possible infection.  He is not volume overloaded on exam.  Would gently hydrate.  2. Hyperglycemic nonketotic state: He has not been taking insulin regularly but also poor po intake.  Glucose > 500.  Treat with IV insulin and IV fluid, but be careful to avoid over-hydration.  May be infected.  3. ID: Elevated WBCs, hypotension, tachycardia.  ?Sepsis.  HR came down with IV fluid in ER.  No PNA on CXR, send blood cultures, at risk for PICC line infection.  Also has paronychia left middle finger.  Would cover with vanco/Zosyn for now pending culture results.  4. Chronic systolic CHF: EF about 10% on chronic milrinone, not LVAD/transplant candidate.  Creatinine/BUN up, he does not appear volume overloaded (in fact looks a bit dry).  - Hold torsemide.  - Gentle hydration.  - Continue home milrinone (co-ox 85%).  With soft BP and complexity, would put on step-down and follow CVPs.  5. AKI on CKD stage III: Creatinine up markedly, suspect prerenal.  Holding torsemide and hydrating.   Marca Ancona 01/16/2016 1:47 PM

## 2016-01-16 NOTE — Progress Notes (Signed)
Paged regarding Lactic Acid of 4.9.  Patient tachycardic to the 130s.  Evaluated at bedside.  No complaints, including no chest pain, palpitations, or dyspnea.  Somnolent but arousable to voice, answering questions appropriately with few words, drifting back to sleep.  Sinus tach on telemetry, normotensive, breathing and sating well on room air.  No rub.  Lungs CTAB, no LE edema.  Sinus tach likely secondary to volume depletion. Bolus 500 mL NS, reassess HR.

## 2016-01-16 NOTE — Progress Notes (Addendum)
Pharmacy Antibiotic Note  Gary Gallegos is a 70 y.o. male admitted on 01/16/2016 with possible sepsis possibly from line infection or finger abscess.  Pharmacy has been consulted for vancomycin and Zosyn dosing.  With noted AKI- baseline SCr ~1.5, currently 3.67 with est CrCl ~65mL/min.  Plan: Vancomycin 750 IV every 48 hours.  Goal trough 15-20 mcg/mL.  Zosyn 2.25g IV q8h Follow for clinical progression, renal function, trough at SS  Height: 5\' 8"  (172.7 cm) Weight: 112 lb (50.8 kg) IBW/kg (Calculated) : 68.4  No data recorded.   Recent Labs Lab 01/16/16 1036 01/16/16 1124  WBC 15.1*  --   CREATININE 3.67* 3.50*    Estimated Creatinine Clearance: 14.5 mL/min (by C-G formula based on SCr of 3.5 mg/dL).    Allergies  Allergen Reactions  . Penicillin G Hives  . Fluorometholone Rash    Eye drop -> rash  . Penicillins Nausea And Vomiting  . Sulfa Antibiotics Nausea And Vomiting  . Sulfamethoxazole-Trimethoprim Nausea Only    Antimicrobials this admission: vanc 7/31  >>  Zosyn 7/31 >>   Dose adjustments this admission: n/a  Microbiology results: 7/31 BCx: sent 7/31 UCx: sent    Thank you for allowing pharmacy to be a part of this patient's care.  Gary Gallegos D. Gary Gallegos, PharmD, BCPS Clinical Pharmacist Pager: [phone] 01/16/2016 1:46 PM

## 2016-01-16 NOTE — ED Notes (Signed)
Cardiology at bedside.

## 2016-01-16 NOTE — Telephone Encounter (Signed)
Pt daughter called to let us know that she is bringing her dad to the ER because he hasn't taken his meds all weekend. She said that he sounds like he is drowning. I told her that I would let the MD's know.

## 2016-01-17 ENCOUNTER — Encounter (HOSPITAL_COMMUNITY): Payer: Medicare HMO

## 2016-01-17 DIAGNOSIS — I110 Hypertensive heart disease with heart failure: Secondary | ICD-10-CM

## 2016-01-17 DIAGNOSIS — I472 Ventricular tachycardia, unspecified: Secondary | ICD-10-CM

## 2016-01-17 DIAGNOSIS — T80219A Unspecified infection due to central venous catheter, initial encounter: Secondary | ICD-10-CM

## 2016-01-17 DIAGNOSIS — R6521 Severe sepsis with septic shock: Secondary | ICD-10-CM

## 2016-01-17 DIAGNOSIS — L03012 Cellulitis of left finger: Secondary | ICD-10-CM

## 2016-01-17 DIAGNOSIS — T827XXA Infection and inflammatory reaction due to other cardiac and vascular devices, implants and grafts, initial encounter: Secondary | ICD-10-CM

## 2016-01-17 DIAGNOSIS — A4101 Sepsis due to Methicillin susceptible Staphylococcus aureus: Secondary | ICD-10-CM

## 2016-01-17 DIAGNOSIS — Z95810 Presence of automatic (implantable) cardiac defibrillator: Secondary | ICD-10-CM

## 2016-01-17 DIAGNOSIS — E43 Unspecified severe protein-calorie malnutrition: Secondary | ICD-10-CM | POA: Insufficient documentation

## 2016-01-17 DIAGNOSIS — N179 Acute kidney failure, unspecified: Secondary | ICD-10-CM

## 2016-01-17 DIAGNOSIS — D751 Secondary polycythemia: Secondary | ICD-10-CM

## 2016-01-17 DIAGNOSIS — Z95828 Presence of other vascular implants and grafts: Secondary | ICD-10-CM

## 2016-01-17 DIAGNOSIS — E1165 Type 2 diabetes mellitus with hyperglycemia: Secondary | ICD-10-CM

## 2016-01-17 DIAGNOSIS — D72829 Elevated white blood cell count, unspecified: Secondary | ICD-10-CM

## 2016-01-17 DIAGNOSIS — IMO0001 Reserved for inherently not codable concepts without codable children: Secondary | ICD-10-CM

## 2016-01-17 DIAGNOSIS — R7881 Bacteremia: Secondary | ICD-10-CM

## 2016-01-17 DIAGNOSIS — IMO0002 Reserved for concepts with insufficient information to code with codable children: Secondary | ICD-10-CM

## 2016-01-17 DIAGNOSIS — B9689 Other specified bacterial agents as the cause of diseases classified elsewhere: Secondary | ICD-10-CM

## 2016-01-17 LAB — CBC
HCT: 43.1 % (ref 39.0–52.0)
Hemoglobin: 13.6 g/dL (ref 13.0–17.0)
MCH: 25.6 pg — ABNORMAL LOW (ref 26.0–34.0)
MCHC: 31.6 g/dL (ref 30.0–36.0)
MCV: 81 fL (ref 78.0–100.0)
Platelets: 163 10*3/uL (ref 150–400)
RBC: 5.32 MIL/uL (ref 4.22–5.81)
RDW: 18.4 % — AB (ref 11.5–15.5)
WBC: 12.5 10*3/uL — AB (ref 4.0–10.5)

## 2016-01-17 LAB — BASIC METABOLIC PANEL
ANION GAP: 9 (ref 5–15)
BUN: 92 mg/dL — ABNORMAL HIGH (ref 6–20)
CALCIUM: 8.7 mg/dL — AB (ref 8.9–10.3)
CO2: 28 mmol/L (ref 22–32)
Chloride: 104 mmol/L (ref 101–111)
Creatinine, Ser: 2.73 mg/dL — ABNORMAL HIGH (ref 0.61–1.24)
GFR, EST AFRICAN AMERICAN: 26 mL/min — AB (ref >60)
GFR, EST NON AFRICAN AMERICAN: 22 mL/min — AB (ref >60)
Glucose, Bld: 143 mg/dL — ABNORMAL HIGH (ref 65–99)
Potassium: 3.8 mmol/L (ref 3.5–5.1)
SODIUM: 141 mmol/L (ref 135–145)

## 2016-01-17 LAB — GLUCOSE, CAPILLARY
GLUCOSE-CAPILLARY: 205 mg/dL — AB (ref 65–99)
GLUCOSE-CAPILLARY: 96 mg/dL (ref 65–99)
GLUCOSE-CAPILLARY: 83 mg/dL (ref 65–99)
Glucose-Capillary: 112 mg/dL — ABNORMAL HIGH (ref 65–99)
Glucose-Capillary: 131 mg/dL — ABNORMAL HIGH (ref 65–99)
Glucose-Capillary: 148 mg/dL — ABNORMAL HIGH (ref 65–99)
Glucose-Capillary: 229 mg/dL — ABNORMAL HIGH (ref 65–99)

## 2016-01-17 LAB — BLOOD CULTURE ID PANEL (REFLEXED)
ACINETOBACTER BAUMANNII: NOT DETECTED
CANDIDA ALBICANS: NOT DETECTED
CANDIDA GLABRATA: NOT DETECTED
CANDIDA TROPICALIS: NOT DETECTED
Candida krusei: NOT DETECTED
Candida parapsilosis: NOT DETECTED
Carbapenem resistance: NOT DETECTED
ENTEROBACTER CLOACAE COMPLEX: NOT DETECTED
ENTEROCOCCUS SPECIES: NOT DETECTED
ESCHERICHIA COLI: NOT DETECTED
Enterobacteriaceae species: NOT DETECTED
HAEMOPHILUS INFLUENZAE: NOT DETECTED
Klebsiella oxytoca: NOT DETECTED
Klebsiella pneumoniae: NOT DETECTED
LISTERIA MONOCYTOGENES: NOT DETECTED
METHICILLIN RESISTANCE: NOT DETECTED
NEISSERIA MENINGITIDIS: NOT DETECTED
PROTEUS SPECIES: NOT DETECTED
Pseudomonas aeruginosa: NOT DETECTED
STAPHYLOCOCCUS AUREUS BCID: DETECTED — AB
STREPTOCOCCUS PYOGENES: NOT DETECTED
STREPTOCOCCUS SPECIES: NOT DETECTED
Serratia marcescens: NOT DETECTED
Staphylococcus species: DETECTED — AB
Streptococcus agalactiae: NOT DETECTED
Streptococcus pneumoniae: NOT DETECTED
VANCOMYCIN RESISTANCE: NOT DETECTED

## 2016-01-17 LAB — TROPONIN I
TROPONIN I: 0.08 ng/mL — AB (ref <0.03)
Troponin I: 0.13 ng/mL (ref <0.03)
Troponin I: 0.08 ng/mL (ref <0.03)

## 2016-01-17 LAB — CARBOXYHEMOGLOBIN
CARBOXYHEMOGLOBIN: 2.2 % — AB (ref 0.5–1.5)
Methemoglobin: 0.8 % (ref 0.0–1.5)
O2 SAT: 80.6 %
Total hemoglobin: 13.9 g/dL (ref 13.5–18.0)

## 2016-01-17 LAB — LACTIC ACID, PLASMA: Lactic Acid, Venous: 1.3 mmol/L (ref 0.5–1.9)

## 2016-01-17 MED ORDER — NAFCILLIN SODIUM 2 G IJ SOLR
2.0000 g | Freq: Once | INTRAVENOUS | Status: AC
  Administered 2016-01-17: 2 g via INTRAVENOUS
  Filled 2016-01-17: qty 2000

## 2016-01-17 MED ORDER — DIPHENHYDRAMINE HCL 50 MG/ML IJ SOLN: 50.0000 mg | Freq: Once | INTRAMUSCULAR | Status: DC | PRN

## 2016-01-17 MED ORDER — NAFCILLIN SODIUM 2 G IJ SOLR
2.0000 g | INTRAMUSCULAR | Status: DC
  Administered 2016-01-17 – 2016-01-24 (×42): 2 g via INTRAVENOUS
  Filled 2016-01-17 (×49): qty 2000

## 2016-01-17 MED ORDER — SODIUM CHLORIDE 0.9 % IV SOLN
INTRAVENOUS | Status: DC
Start: 2016-01-17 — End: 2016-01-18
  Administered 2016-01-17: 06:00:00 via INTRAVENOUS

## 2016-01-17 MED ORDER — LIDOCAINE HCL 1 % IJ SOLN
5.0000 mL | Freq: Once | INTRAMUSCULAR | Status: DC
  Filled 2016-01-17: qty 5

## 2016-01-17 MED ORDER — INSULIN GLARGINE 100 UNIT/ML ~~LOC~~ SOLN
5.0000 [IU] | Freq: Every day | SUBCUTANEOUS | Status: DC
  Administered 2016-01-18 – 2016-01-21 (×4): 5 [IU] via SUBCUTANEOUS
  Filled 2016-01-17 (×6): qty 0.05

## 2016-01-17 MED ORDER — INSULIN ASPART 100 UNIT/ML ~~LOC~~ SOLN: 0.0000 [IU] | Freq: Every day | SUBCUTANEOUS | Status: DC

## 2016-01-17 MED ORDER — SODIUM CHLORIDE 0.9 % IV SOLN: INTRAVENOUS | Status: DC

## 2016-01-17 MED ORDER — INSULIN ASPART 100 UNIT/ML ~~LOC~~ SOLN
0.0000 [IU] | Freq: Three times a day (TID) | SUBCUTANEOUS | Status: DC
  Administered 2016-01-17 (×2): 5 [IU] via SUBCUTANEOUS
  Administered 2016-01-18 – 2016-01-19 (×4): 3 [IU] via SUBCUTANEOUS
  Administered 2016-01-20 (×2): 2 [IU] via SUBCUTANEOUS
  Administered 2016-01-21: 3 [IU] via SUBCUTANEOUS

## 2016-01-17 MED ORDER — NAFCILLIN SODIUM 2 G IJ SOLR
2.0000 g | INTRAMUSCULAR | Status: DC
  Filled 2016-01-17 (×2): qty 2000

## 2016-01-17 MED ORDER — SODIUM CHLORIDE 0.9 % IV BOLUS (SEPSIS)
500.0000 mL | Freq: Once | INTRAVENOUS | Status: AC
  Administered 2016-01-17: 500 mL via INTRAVENOUS

## 2016-01-17 NOTE — Progress Notes (Signed)
Initial Nutrition Assessment  DOCUMENTATION CODES:   Severe malnutrition in context of chronic illness, Underweight  INTERVENTION:    Advance diet as medically appropriate, add supplements accordingly  NUTRITION DIAGNOSIS:   Malnutrition related to chronic illness as evidenced by estimated needs  GOAL:   Patient will meet greater than or equal to 90% of their needs  MONITOR:   Diet advancement, PO intake, Supplement acceptance, Labs, Weight trends, I & O's  REASON FOR ASSESSMENT:   Malnutrition Screening Tool  ASSESSMENT:   70 yo Male with PMHx of type II DM, systolic CHF with ICD placement and HTN that presents to the ED due to physical decline as noted per daughter's history.  Patient was unable to state his location, year or month.  Most of history was obtained per ED notes and daughter from phone discussion.  Mr. Jochem has had a decline in the past 2 weeks.  Prior to the decline patient was able to walk up several flights of stairs and attend to his daily needs.   Daughter thought patient was fluid overloaded and began to fluid restrict.  The patient has not been able to produce urine in the past 2 days and is now not able to walk or do his regular activities of daily living.  Due to a recent move he has not been taking his medications regularly in the past 1-2weeks.   Patient does endorse nausea and vomiting along with abdominal pain.  Patient minimally conversant with RD. States he was eating "good" PTA, however, per H&P pt with decreased PO intake, nausea and vomiting. Per wt readings, pt with severe weight loss (22%) since June 2017. Currently NPO >> will add oral nutrition supplements when able. Nutrition-Focused physical exam completed. Findings are severe fat depletion, severe muscle depletion, and no edema.  Diet Order:  Diet NPO time specified  Skin:  Reviewed, no issues  Last BM:  7/27  Height:   Ht Readings from Last 1 Encounters:  01/16/16 5\' 8"  (1.727  m)    Wt Readings from Last 10 Encounters:  01/16/16 112 lb (50.8 kg)  11/24/15 143 lb (64.9 kg)  10/13/15 148 lb 3.2 oz (67.2 kg)  08/30/15 147 lb 3.2 oz (66.8 kg)  07/20/15 130 lb (59 kg)  07/19/15 137 lb (62.1 kg)  05/31/15 134 lb (60.8 kg)  04/12/15 136 lb 6.4 oz (61.9 kg)  03/01/15 142 lb 12.8 oz (64.8 kg)  02/15/15 155 lb (70.3 kg)    Weight:   Wt Readings from Last 1 Encounters:  01/16/16 112 lb (50.8 kg)    Ideal Body Weight:  70 kg  BMI:  Body mass index is 17.03 kg/m.  Estimated Nutritional Needs:   Kcal:  1300-1500  Protein:  65-75 gm  Fluid:  >/= 1.5 L  EDUCATION NEEDS:   No education needs identified at this time  Maureen Chatters, RD, LDN Pager #: [phone] After-Hours Pager #: [phone]

## 2016-01-17 NOTE — Progress Notes (Addendum)
Pharmacy Antibiotic Note  Gary Gallegos is a 70 y.o. male admitted on 01/16/2016 with MSSA bacteremia and an ICD placed. Pharmacy has been consulted for nafcillin dosing.  Allergy has been discussed with ID phyisican and 3 ID trained pharmacists. Patient has been tolerating Zosyn without any reaction for past 2 days, unsure if has true PCN allergy. Nafcillin will have better penetration if infection has spread into other areas so really the better choice. Will give one dose trial of nafcillin and then enter in schedule if tolerates dose ok. Ordered PRN IV Benadryl in case of allergic reaction. If has any type of reaction will change abx to cefazolin. Discussed with RN as well.  Plan: Give nafcillin 2g IV x 1 Monitor for allergic reaction F/U scheduled abx dosing after trial  ADDENDUM: Patient tolerated first dose of nafcillin with no issues. Talked with RN and stopped by patient room.  Continue nafcillin 2g IV Q4 Monitor clinical picture, renal function,  F/U goals of care, LOT   Height: 5\' 8"  (172.7 cm) Weight: 112 lb (50.8 kg) IBW/kg (Calculated) : 68.4  Temp (24hrs), Avg:97.6 F (36.4 C), Min:97.2 F (36.2 C), Max:98.2 F (36.8 C)   Recent Labs Lab 01/16/16 1036  01/16/16 1459 01/16/16 1718 01/16/16 1744 01/16/16 1748 01/16/16 2140 01/17/16 0435 01/17/16 0645  WBC 15.1*  --   --   --   --   --   --  12.5*  --   CREATININE 3.67*  < > 3.70* 3.50* 3.50*  --  3.11*  --  2.73*  LATICACIDVEN  --   --   --  5.0*  --  4.9*  --   --   --   < > = values in this interval not displayed.  Estimated Creatinine Clearance: 18.6 mL/min (by C-G formula based on SCr of 2.73 mg/dL).    Allergies  Allergen Reactions  . Penicillin G Hives  . Fluorometholone Rash    Eye drop -> rash  . Penicillins Nausea And Vomiting  . Sulfa Antibiotics Nausea And Vomiting  . Sulfamethoxazole-Trimethoprim Nausea Only    Antimicrobials this admission: Zosyn 7/31 >> 8/1 Vancomycin 7/31 >>  8/1 Nafcillin 8/1 >>  Dose adjustments this admission: n/a  Microbiology results: 7/31 BCx: 1/2 MSSA 7/31 urine: sent  Thank you for allowing pharmacy to be a part of this patient's care.  Gary Gallegos 01/17/2016 9:44 AM

## 2016-01-17 NOTE — Consult Note (Signed)
Date of Admission:  01/16/2016  Date of Consult:  01/17/2016  Reason for Consult: Staph aureus bacteremia with ICD in place Referring Physician: "Auto-consult" and Dr. Lynnae January   HPI: Gary Gallegos is an 70 y.o. male AICD, chronic PICC line for milrinone and now admitted with septic shock. He has paronychia and now growing Staph aureus from blood cultures. He has been on both vancomycin and zosyn despite alleged hx of "hives" with PCN.     Past Medical History:  Diagnosis Date  . AICD (automatic cardioverter/defibrillator) present   . Biventricular CHF (congestive heart failure) ([place])    a. Echo 8/16:  EF 10-15%, diff HK, trivial AI, mild MR, mild LAE, mod RVE, mild reduced RVF, mild TR, PASP 40 mmHg, mild eff without tamponade.   . Blind left eye   . Depression   . GERD (gastroesophageal reflux disease)   . Hyperlipidemia   . Hypertension   . Impaired vision in both eyes   . NICM (nonischemic cardiomyopathy) ([place])   . Type II diabetes mellitus ([place])     Past Surgical History:  Procedure Laterality Date  . CARDIAC CATHETERIZATION    . CARDIAC DEFIBRILLATOR PLACEMENT    . ESOPHAGOGASTRODUODENOSCOPY N/A 07/09/2013   Procedure: ESOPHAGOGASTRODUODENOSCOPY (EGD);  Surgeon: Lafayette Dragon, MD;  Location: [facility] ENDOSCOPY;  Service: Endoscopy;  Laterality: N/A;  . RIGHT HEART CATHETERIZATION N/A 07/08/2013   Procedure: RIGHT HEART CATH;  Surgeon: Larey Dresser, MD;  Location: [facility] CATH LAB;  Service: Cardiovascular;  Laterality: N/A;    Social History:  reports that he has never smoked. He has never used smokeless tobacco. He reports that he does not drink alcohol or use drugs.   Family History  Problem Relation Age of Onset  . Heart disease Father     Allergies  Allergen Reactions  . Penicillin G Hives  . Fluorometholone Rash    Eye drop -> rash  . Penicillins Nausea And Vomiting  . Sulfa Antibiotics Nausea And Vomiting  . Sulfamethoxazole-Trimethoprim Nausea Only      Medications: I have reviewed patients current medications as documented in Epic Anti-infectives    Start     Dose/Rate Route Frequency Ordered Stop   01/17/16 0900  nafcillin injection 2 g     2 g Intravenous Every 4 hours 01/17/16 0827     01/16/16 1500  vancomycin (VANCOCIN) IVPB 750 mg/150 ml premix  Status:  Discontinued     750 mg 150 mL/hr over 60 Minutes Intravenous Every 48 hours 01/16/16 1348 01/17/16 0827   01/16/16 1400  piperacillin-tazobactam (ZOSYN) IVPB 2.25 g  Status:  Discontinued     2.25 g 100 mL/hr over 30 Minutes Intravenous Every 8 hours 01/16/16 1348 01/17/16 0827         ROS: as in HPI otherwise remainder of 12 point Review of Systems is not obtainable due to patient's confusion   Blood pressure 106/75, pulse (!) 132, temperature 97.8 F (36.6 C), temperature source Oral, resp. rate 20, height 5' 8"  (1.727 m), weight 112 lb (50.8 kg), SpO2 97 %. General: Alert and awake, oriented person and thinks he is in [place]: anicteric sclera,  EOMI, oropharynx clear and without exudate Cardiovascular: regular rate, normal r,  no murmur rubs or gallops heard with disposable stethoscope Pulmonary: clear to auscultation bilaterally, no wheezing, rales or rhonchi Gastrointestinal: soft nontender, nondistended, normal bowel sounds, Musculoskeletal: no  clubbing or edema noted bilaterally Skin,   Midline in place on  chest, ICD without overt infection (though that means nothing with bacteremia)  His finger  01/17/16:     Left knee        Neuro: nonfocal, strength and sensation intact   Results for orders placed or performed during the hospital encounter of 01/16/16 (from the past 48 hour(s))  APTT     Status: None   Collection Time: 01/16/16 10:36 AM  Result Value Ref Range   aPTT 32 24 - 36 seconds  CBC     Status: Abnormal   Collection Time: 01/16/16 10:36 AM  Result Value Ref Range   WBC 15.1 (H) 4.0 - 10.5 K/uL    Comment: REPEATED TO  VERIFY   RBC 6.41 (H) 4.22 - 5.81 MIL/uL   Hemoglobin 17.1 (H) 13.0 - 17.0 g/dL    Comment: REPEATED TO VERIFY   HCT 52.2 (H) 39.0 - 52.0 %   MCV 81.4 78.0 - 100.0 fL   MCH 26.7 26.0 - 34.0 pg   MCHC 32.8 30.0 - 36.0 g/dL   RDW 18.2 (H) 11.5 - 15.5 %   Platelets 181 150 - 400 K/uL  Comprehensive metabolic panel     Status: Abnormal   Collection Time: 01/16/16 10:36 AM  Result Value Ref Range   Sodium 135 135 - 145 mmol/L   Potassium 4.3 3.5 - 5.1 mmol/L   Chloride 91 (L) 101 - 111 mmol/L   CO2 25 22 - 32 mmol/L   Glucose, Bld 528 (HH) 65 - 99 mg/dL    Comment: CRITICAL RESULT CALLED TO, READ BACK BY AND VERIFIED WITH: M.DOOLEY,RN 01/16/16 1147 BY BSLADE    BUN 105 (H) 6 - 20 mg/dL   Creatinine, Ser 3.67 (H) 0.61 - 1.24 mg/dL   Calcium 9.8 8.9 - 10.3 mg/dL   Total Protein 8.4 (H) 6.5 - 8.1 g/dL   Albumin 3.7 3.5 - 5.0 g/dL   AST 31 15 - 41 U/L   ALT 20 17 - 63 U/L   Alkaline Phosphatase 169 (H) 38 - 126 U/L   Total Bilirubin 5.2 (H) 0.3 - 1.2 mg/dL   GFR calc non Af Amer 16 (L) >60 mL/min   GFR calc Af Amer 18 (L) >60 mL/min    Comment: (NOTE) The eGFR has been calculated using the CKD EPI equation. This calculation has not been validated in all clinical situations. eGFR's persistently <60 mL/min signify possible Chronic Kidney Disease.    Anion gap 19 (H) 5 - 15  Protime-INR     Status: Abnormal   Collection Time: 01/16/16 10:36 AM  Result Value Ref Range   Prothrombin Time 17.1 (H) 11.4 - 15.2 seconds   INR 1.38   Brain natriuretic peptide (order if patient c/o SOB ONLY)     Status: Abnormal   Collection Time: 01/16/16 10:36 AM  Result Value Ref Range   B Natriuretic Peptide 626.2 (H) 0.0 - 100.0 pg/mL  Lipase, blood     Status: None   Collection Time: 01/16/16 10:36 AM  Result Value Ref Range   Lipase 16 11 - 51 U/L  I-stat troponin, ED (not at [facility])     Status: Abnormal   Collection Time: 01/16/16 11:04 AM  Result Value Ref Range   Troponin i, poc 0.19 (HH)  0.00 - 0.08 ng/mL   Comment NOTIFIED PHYSICIAN    Comment 3            Comment: Due to the release kinetics of cTnI, a negative result within the first  hours of the onset of symptoms does not rule out myocardial infarction with certainty. If myocardial infarction is still suspected, repeat the test at appropriate intervals.   I-stat chem 8, ed     Status: Abnormal   Collection Time: 01/16/16 11:24 AM  Result Value Ref Range   Sodium 137 135 - 145 mmol/L   Potassium 4.2 3.5 - 5.1 mmol/L   Chloride 93 (L) 101 - 111 mmol/L   BUN 87 (H) 6 - 20 mg/dL   Creatinine, Ser 3.50 (H) 0.61 - 1.24 mg/dL   Glucose, Bld 536 (HH) 65 - 99 mg/dL   Calcium, Ion 1.00 (L) 1.12 - 1.23 mmol/L   TCO2 28 0 - 100 mmol/L   Hemoglobin 19.7 (H) 13.0 - 17.0 g/dL   HCT 58.0 (H) 39.0 - 52.0 %   Comment NOTIFIED PHYSICIAN   I-Stat Venous Blood Gas, ED  ([facility], [facility])     Status: Abnormal   Collection Time: 01/16/16 12:58 PM  Result Value Ref Range   pH, Ven 7.467 (H) 7.250 - 7.300   pCO2, Ven 42.6 (L) 45.0 - 50.0 mmHg   pO2, Ven 63.0 (H) 31.0 - 45.0 mmHg   Bicarbonate 30.8 (H) 20.0 - 24.0 mEq/L   TCO2 32 0 - 100 mmol/L   O2 Saturation 93.0 %   Acid-Base Excess 6.0 (H) 0.0 - 2.0 mmol/L   Patient temperature HIDE    Sample type VENOUS   Culture, blood (Routine X 2) w Reflex to ID Panel     Status: None (Preliminary result)   Collection Time: 01/16/16  1:05 PM  Result Value Ref Range   Specimen Description BLOOD RIGHT FOREARM    Special Requests BOTTLES DRAWN AEROBIC AND ANAEROBIC 5ML    Culture  Setup Time      GRAM POSITIVE COCCI IN CLUSTERS ANAEROBIC BOTTLE ONLY Organism ID to follow CRITICAL RESULT CALLED TO, READ BACK BY AND VERIFIED WITHAlona Bene PHARMD 0617 01/17/16 A BROWNING    Culture PENDING    Report Status PENDING   Blood Culture ID Panel (Reflexed)     Status: Abnormal   Collection Time: 01/16/16  1:05 PM  Result Value Ref Range   Enterococcus species NOT DETECTED NOT DETECTED   Vancomycin  resistance NOT DETECTED NOT DETECTED   Listeria monocytogenes NOT DETECTED NOT DETECTED   Staphylococcus species DETECTED (A) NOT DETECTED    Comment: CRITICAL RESULT CALLED TO, READ BACK BY AND VERIFIED WITH: V BRYK PHARMD 0617 01/17/16 A BROWNING    Staphylococcus aureus DETECTED (A) NOT DETECTED    Comment: CRITICAL RESULT CALLED TO, READ BACK BY AND VERIFIED WITHAlona Bene PHARMD 0617 01/17/16 A BROWNING    Methicillin resistance NOT DETECTED NOT DETECTED   Streptococcus species NOT DETECTED NOT DETECTED   Streptococcus agalactiae NOT DETECTED NOT DETECTED   Streptococcus pneumoniae NOT DETECTED NOT DETECTED   Streptococcus pyogenes NOT DETECTED NOT DETECTED   Acinetobacter baumannii NOT DETECTED NOT DETECTED   Enterobacteriaceae species NOT DETECTED NOT DETECTED   Enterobacter cloacae complex NOT DETECTED NOT DETECTED   Escherichia coli NOT DETECTED NOT DETECTED   Klebsiella oxytoca NOT DETECTED NOT DETECTED   Klebsiella pneumoniae NOT DETECTED NOT DETECTED   Proteus species NOT DETECTED NOT DETECTED   Serratia marcescens NOT DETECTED NOT DETECTED   Carbapenem resistance NOT DETECTED NOT DETECTED   Haemophilus influenzae NOT DETECTED NOT DETECTED   Neisseria meningitidis NOT DETECTED NOT DETECTED   Pseudomonas aeruginosa NOT DETECTED NOT DETECTED   Candida  albicans NOT DETECTED NOT DETECTED   Candida glabrata NOT DETECTED NOT DETECTED   Candida krusei NOT DETECTED NOT DETECTED   Candida parapsilosis NOT DETECTED NOT DETECTED   Candida tropicalis NOT DETECTED NOT DETECTED  Carboxyhemoglobin     Status: Abnormal   Collection Time: 01/16/16  1:15 PM  Result Value Ref Range   Total hemoglobin 16.2 13.5 - 18.0 g/dL   O2 Saturation 86.2 %   Carboxyhemoglobin 2.4 (H) 0.5 - 1.5 %   Methemoglobin 0.7 0.0 - 1.5 %  CBG monitoring, ED     Status: Abnormal   Collection Time: 01/16/16  1:39 PM  Result Value Ref Range   Glucose-Capillary 480 (H) 65 - 99 mg/dL  Beta-hydroxybutyric acid      Status: None   Collection Time: 01/16/16  2:45 PM  Result Value Ref Range   Beta-Hydroxybutyric Acid 0.07 0.05 - 0.27 mmol/L  Basic metabolic panel     Status: Abnormal   Collection Time: 01/16/16  2:59 PM  Result Value Ref Range   Sodium 136 135 - 145 mmol/L   Potassium 4.3 3.5 - 5.1 mmol/L   Chloride 97 (L) 101 - 111 mmol/L   CO2 21 (L) 22 - 32 mmol/L   Glucose, Bld 490 (H) 65 - 99 mg/dL   BUN 108 (H) 6 - 20 mg/dL   Creatinine, Ser 3.70 (H) 0.61 - 1.24 mg/dL   Calcium 9.0 8.9 - 10.3 mg/dL   GFR calc non Af Amer 15 (L) >60 mL/min   GFR calc Af Amer 18 (L) >60 mL/min    Comment: (NOTE) The eGFR has been calculated using the CKD EPI equation. This calculation has not been validated in all clinical situations. eGFR's persistently <60 mL/min signify possible Chronic Kidney Disease.    Anion gap 18 (H) 5 - 15  CBG monitoring, ED     Status: Abnormal   Collection Time: 01/16/16  3:08 PM  Result Value Ref Range   Glucose-Capillary 502 (HH) 65 - 99 mg/dL  CBG monitoring, ED     Status: Abnormal   Collection Time: 01/16/16  4:22 PM  Result Value Ref Range   Glucose-Capillary 433 (H) 65 - 99 mg/dL  Glucose, capillary     Status: Abnormal   Collection Time: 01/16/16  5:10 PM  Result Value Ref Range   Glucose-Capillary 306 (H) 65 - 99 mg/dL  Lactic acid, plasma     Status: Abnormal   Collection Time: 01/16/16  5:18 PM  Result Value Ref Range   Lactic Acid, Venous 5.0 (HH) 0.5 - 1.9 mmol/L    Comment: CRITICAL RESULT CALLED TO, READ BACK BY AND VERIFIED WITH: DAVIS,A RN @1757  BY GRINSTEAD,C 9.16.38   Basic metabolic panel     Status: Abnormal   Collection Time: 01/16/16  5:18 PM  Result Value Ref Range   Sodium 140 135 - 145 mmol/L   Potassium 3.1 (L) 3.5 - 5.1 mmol/L    Comment: NO VISIBLE HEMOLYSIS DELTA CHECK NOTED    Chloride 98 (L) 101 - 111 mmol/L   CO2 26 22 - 32 mmol/L   Glucose, Bld 303 (H) 65 - 99 mg/dL   BUN 102 (H) 6 - 20 mg/dL   Creatinine, Ser 3.50 (H) 0.61 -  1.24 mg/dL   Calcium 9.1 8.9 - 10.3 mg/dL   GFR calc non Af Amer 17 (L) >60 mL/min   GFR calc Af Amer 19 (L) >60 mL/min    Comment: (NOTE) The eGFR has been calculated  using the CKD EPI equation. This calculation has not been validated in all clinical situations. eGFR's persistently <60 mL/min signify possible Chronic Kidney Disease.    Anion gap 16 (H) 5 - 15  Basic metabolic panel     Status: Abnormal   Collection Time: 01/16/16  5:44 PM  Result Value Ref Range   Sodium 139 135 - 145 mmol/L   Potassium 2.9 (L) 3.5 - 5.1 mmol/L   Chloride 98 (L) 101 - 111 mmol/L   CO2 26 22 - 32 mmol/L   Glucose, Bld 246 (H) 65 - 99 mg/dL   BUN 102 (H) 6 - 20 mg/dL   Creatinine, Ser 3.50 (H) 0.61 - 1.24 mg/dL   Calcium 9.2 8.9 - 10.3 mg/dL   GFR calc non Af Amer 17 (L) >60 mL/min   GFR calc Af Amer 19 (L) >60 mL/min    Comment: (NOTE) The eGFR has been calculated using the CKD EPI equation. This calculation has not been validated in all clinical situations. eGFR's persistently <60 mL/min signify possible Chronic Kidney Disease.    Anion gap 15 5 - 15  Lactic acid, plasma     Status: Abnormal   Collection Time: 01/16/16  5:48 PM  Result Value Ref Range   Lactic Acid, Venous 4.9 (HH) 0.5 - 1.9 mmol/L    Comment: CRITICAL RESULT CALLED TO, READ BACK BY AND VERIFIED WITH: DAVIS,A RN @1913  BY GRINSTEAD,C 7.31.17   MRSA PCR Screening     Status: None   Collection Time: 01/16/16  6:22 PM  Result Value Ref Range   MRSA by PCR NEGATIVE NEGATIVE    Comment:        The GeneXpert MRSA Assay (FDA approved for NASAL specimens only), is one component of a comprehensive MRSA colonization surveillance program. It is not intended to diagnose MRSA infection nor to guide or monitor treatment for MRSA infections.   Glucose, capillary     Status: Abnormal   Collection Time: 01/16/16  7:19 PM  Result Value Ref Range   Glucose-Capillary 103 (H) 65 - 99 mg/dL  Glucose, capillary     Status: Abnormal     Collection Time: 01/16/16  8:47 PM  Result Value Ref Range   Glucose-Capillary 43 (LL) 65 - 99 mg/dL   Comment 1 Notify RN   Urinalysis, Routine w reflex microscopic (not at [facility])     Status: Abnormal   Collection Time: 01/16/16  8:58 PM  Result Value Ref Range   Color, Urine AMBER (A) YELLOW    Comment: BIOCHEMICALS MAY BE AFFECTED BY COLOR   APPearance CLOUDY (A) CLEAR   Specific Gravity, Urine 1.023 1.005 - 1.030   pH 5.0 5.0 - 8.0   Glucose, UA 500 (A) NEGATIVE mg/dL   Hgb urine dipstick TRACE (A) NEGATIVE   Bilirubin Urine MODERATE (A) NEGATIVE   Ketones, ur 15 (A) NEGATIVE mg/dL   Protein, ur 30 (A) NEGATIVE mg/dL   Nitrite POSITIVE (A) NEGATIVE   Leukocytes, UA MODERATE (A) NEGATIVE  Urine microscopic-add on     Status: Abnormal   Collection Time: 01/16/16  8:58 PM  Result Value Ref Range   Squamous Epithelial / LPF 6-30 (A) NONE SEEN   WBC, UA 6-30 0 - 5 WBC/hpf   RBC / HPF 6-30 0 - 5 RBC/hpf   Bacteria, UA MANY (A) NONE SEEN   Casts HYALINE CASTS (A) NEGATIVE  Glucose, capillary     Status: None   Collection Time: 01/16/16  9:20 PM  Result Value Ref Range   Glucose-Capillary 88 65 - 99 mg/dL  Basic metabolic panel     Status: Abnormal   Collection Time: 01/16/16  9:40 PM  Result Value Ref Range   Sodium 140 135 - 145 mmol/L   Potassium 3.3 (L) 3.5 - 5.1 mmol/L   Chloride 101 101 - 111 mmol/L   CO2 29 22 - 32 mmol/L   Glucose, Bld 85 65 - 99 mg/dL   BUN 100 (H) 6 - 20 mg/dL   Creatinine, Ser 3.11 (H) 0.61 - 1.24 mg/dL   Calcium 8.7 (L) 8.9 - 10.3 mg/dL   GFR calc non Af Amer 19 (L) >60 mL/min   GFR calc Af Amer 22 (L) >60 mL/min    Comment: (NOTE) The eGFR has been calculated using the CKD EPI equation. This calculation has not been validated in all clinical situations. eGFR's persistently <60 mL/min signify possible Chronic Kidney Disease.    Anion gap 10 5 - 15  Glucose, capillary     Status: None   Collection Time: 01/16/16 10:36 PM  Result Value Ref  Range   Glucose-Capillary 96 65 - 99 mg/dL  Glucose, capillary     Status: Abnormal   Collection Time: 01/16/16 11:53 PM  Result Value Ref Range   Glucose-Capillary 112 (H) 65 - 99 mg/dL  CBC     Status: Abnormal   Collection Time: 01/17/16  4:35 AM  Result Value Ref Range   WBC 12.5 (H) 4.0 - 10.5 K/uL   RBC 5.32 4.22 - 5.81 MIL/uL   Hemoglobin 13.6 13.0 - 17.0 g/dL    Comment: REPEATED TO VERIFY SPECIMEN CHECKED FOR CLOTS    HCT 43.1 39.0 - 52.0 %   MCV 81.0 78.0 - 100.0 fL   MCH 25.6 (L) 26.0 - 34.0 pg   MCHC 31.6 30.0 - 36.0 g/dL   RDW 18.4 (H) 11.5 - 15.5 %   Platelets 163 150 - 400 K/uL  Glucose, capillary     Status: Abnormal   Collection Time: 01/17/16  4:37 AM  Result Value Ref Range   Glucose-Capillary 148 (H) 65 - 99 mg/dL  Carboxyhemoglobin     Status: Abnormal   Collection Time: 01/17/16  4:44 AM  Result Value Ref Range   Total hemoglobin 13.9 13.5 - 18.0 g/dL   O2 Saturation 80.6 %   Carboxyhemoglobin 2.2 (H) 0.5 - 1.5 %   Methemoglobin 0.8 0.0 - 1.5 %  Basic metabolic panel     Status: Abnormal   Collection Time: 01/17/16  6:45 AM  Result Value Ref Range   Sodium 141 135 - 145 mmol/L   Potassium 3.8 3.5 - 5.1 mmol/L   Chloride 104 101 - 111 mmol/L   CO2 28 22 - 32 mmol/L   Glucose, Bld 143 (H) 65 - 99 mg/dL   BUN 92 (H) 6 - 20 mg/dL   Creatinine, Ser 2.73 (H) 0.61 - 1.24 mg/dL   Calcium 8.7 (L) 8.9 - 10.3 mg/dL   GFR calc non Af Amer 22 (L) >60 mL/min   GFR calc Af Amer 26 (L) >60 mL/min    Comment: (NOTE) The eGFR has been calculated using the CKD EPI equation. This calculation has not been validated in all clinical situations. eGFR's persistently <60 mL/min signify possible Chronic Kidney Disease.    Anion gap 9 5 - 15  Glucose, capillary     Status: Abnormal   Collection Time: 01/17/16  8:47 AM  Result Value Ref Range  Glucose-Capillary 131 (H) 65 - 99 mg/dL   @BRIEFLABTABLE (sdes,specrequest,cult,reptstatus)   ) Recent Results (from the  past 720 hour(s))  Culture, blood (Routine X 2) w Reflex to ID Panel     Status: None (Preliminary result)   Collection Time: 01/16/16  1:05 PM  Result Value Ref Range Status   Specimen Description BLOOD RIGHT FOREARM  Final   Special Requests BOTTLES DRAWN AEROBIC AND ANAEROBIC 5ML  Final   Culture  Setup Time   Final    GRAM POSITIVE COCCI IN CLUSTERS ANAEROBIC BOTTLE ONLY Organism ID to follow CRITICAL RESULT CALLED TO, READ BACK BY AND VERIFIED WITHAlona Bene PHARMD [id number] 01/17/16 A BROWNING    Culture PENDING  Incomplete   Report Status PENDING  Incomplete  Blood Culture ID Panel (Reflexed)     Status: Abnormal   Collection Time: 01/16/16  1:05 PM  Result Value Ref Range Status   Enterococcus species NOT DETECTED NOT DETECTED Final   Vancomycin resistance NOT DETECTED NOT DETECTED Final   Listeria monocytogenes NOT DETECTED NOT DETECTED Final   Staphylococcus species DETECTED (A) NOT DETECTED Final    Comment: CRITICAL RESULT CALLED TO, READ BACK BY AND VERIFIED WITH: V BRYK PHARMD 0617 01/17/16 A BROWNING    Staphylococcus aureus DETECTED (A) NOT DETECTED Final    Comment: CRITICAL RESULT CALLED TO, READ BACK BY AND VERIFIED WITHAlona Bene PHARMD 0617 01/17/16 A BROWNING    Methicillin resistance NOT DETECTED NOT DETECTED Final   Streptococcus species NOT DETECTED NOT DETECTED Final   Streptococcus agalactiae NOT DETECTED NOT DETECTED Final   Streptococcus pneumoniae NOT DETECTED NOT DETECTED Final   Streptococcus pyogenes NOT DETECTED NOT DETECTED Final   Acinetobacter baumannii NOT DETECTED NOT DETECTED Final   Enterobacteriaceae species NOT DETECTED NOT DETECTED Final   Enterobacter cloacae complex NOT DETECTED NOT DETECTED Final   Escherichia coli NOT DETECTED NOT DETECTED Final   Klebsiella oxytoca NOT DETECTED NOT DETECTED Final   Klebsiella pneumoniae NOT DETECTED NOT DETECTED Final   Proteus species NOT DETECTED NOT DETECTED Final   Serratia marcescens NOT DETECTED NOT  DETECTED Final   Carbapenem resistance NOT DETECTED NOT DETECTED Final   Haemophilus influenzae NOT DETECTED NOT DETECTED Final   Neisseria meningitidis NOT DETECTED NOT DETECTED Final   Pseudomonas aeruginosa NOT DETECTED NOT DETECTED Final   Candida albicans NOT DETECTED NOT DETECTED Final   Candida glabrata NOT DETECTED NOT DETECTED Final   Candida krusei NOT DETECTED NOT DETECTED Final   Candida parapsilosis NOT DETECTED NOT DETECTED Final   Candida tropicalis NOT DETECTED NOT DETECTED Final  MRSA PCR Screening     Status: None   Collection Time: 01/16/16  6:22 PM  Result Value Ref Range Status   MRSA by PCR NEGATIVE NEGATIVE Final    Comment:        The GeneXpert MRSA Assay (FDA approved for NASAL specimens only), is one component of a comprehensive MRSA colonization surveillance program. It is not intended to diagnose MRSA infection nor to guide or monitor treatment for MRSA infections.      Impression/Recommendation  Active Problems:   Acute renal failure (ARF) (HCC)   Gary Gallegos is a 70 y.o. male with  SAB septic shock with AICD, paronychia, Mid line  #1.      [place] Antimicrobial Management Team Staphylococcus aureus bacteremia   Staphylococcus aureus bacteremia (SAB) is associated with a high rate of complications and mortality.  Specific aspects of clinical management are critical to  optimizing the outcome of patients with SAB.  Therefore, the [facility] Antimicrobial Management Team [facility]) has initiated an intervention aimed at improving the management of SAB at [facility].  To do so, Infectious Diseases physicians are providing an evidence-based consult for the management of all patients with SAB.     Yes No Comments  Perform follow-up blood cultures (even if the patient is afebrile) to ensure clearance of bacteremia [x]  []  He would also need them drawn AFTER PICC removal and ICD removal  Remove vascular catheter and obtain follow-up blood cultures  after the removal of the catheter [x]  []  To be CURED he will need PICC removed  Perform echocardiography to evaluate for endocarditis (transthoracic ECHO is 40-50% sensitive, TEE is > 90% sensitive) []  []  Please keep in mind, that neither test can definitively EXCLUDE endocarditis, and that should clinical suspicion remain high for endocarditis the patient should then still be treated with an "endocarditis" duration of therapy = 6 weeks  IF aggressive care being pursued he needs a TEE  Consult electrophysiologist to evaluate implanted cardiac device (pacemaker, ICD) [x]  []  If he is to be cured he needs AICD extraction  Ensure source control []  []  Have all abscesses been drained effectively? Have deep seeded infections (septic joints or osteomyelitis) had appropriate surgical debridement?  Finger could be source. If it does not drain may need surgical debridement. Would apply compresses   Investigate for "metastatic" sites of infection []  []  Does the patient have ANY symptom or physical exam finding that would suggest a deeper infection (back or neck pain that may be suggestive of vertebral osteomyelitis or epidural abscess, muscle pain that could be a symptom of pyomyositis)?  Keep in mind that for deep seeded infections MRI imaging with contrast is preferred rather than other often insensitive tests such as plain x-rays, especially early in a patient's presentation. No other obvious sources  Change antibiotic therapy to   changed to NAFCILLIN for more specific therapy and felt it was safe to do so given his having tolerated IV Zosyn  Due to getting multiple phone calls re anaphylaxis etc on PCN which he has not had on zosyn I will change him to ancef for now  Note ancef will not have ANY CNS penetration in case he has left sided endocarditis with septicv emboli but will be reasonably NARROw though not as narrow as NAF []  []  Beta-lactam antibiotics are preferred for MSSA due to higher cure rates.     If on Vancomycin, goal trough should be 15 - 20 mcg/mL  Estimated duration of IV antibiotic therapy:  6 weeks after AICD device extraction [x]  []  Consult case management for probably prolonged outpatient IV antibiotic therapy    To me it seems it will be highly challenging to enact AICD removal, PICC line removal but would discuss with Dr. Aundra Dubin and Dr. Lovena Le from EP to see if this will be do-able.   He also should have a TEE to look at heart valves and device more closely.  However those first TWO questions need answering and if they are not do-able it is VERY, VERY unlikely that he will be able to clear this infection.  I think a palliative care consult is also in order   01/17/2016, 8:58 AM   Thank you so much for this interesting consult  [place] [facility] [place] [phone] (pager) [phone] (office) 01/17/2016, 8:58 AM  Rhina [place] 01/17/2016, 8:58 AM

## 2016-01-17 NOTE — Progress Notes (Signed)
Subjective: Currently, the patient is comfortable and feeling better than yesterday. He requests food and drink and has no acute complaints.  Interval Events: ID, HF, and EP consultants have seen the patient. Lactic acid continued to trend down. Tachycardia improved.  Objective: Vital signs in last 24 hours: Vitals:   01/17/16 0900 01/17/16 1217 01/17/16 1300 01/17/16 1558  BP: 117/79 (!) 85/76 97/70 (!) 84/66  Pulse: 100 98 94   Resp: 20  17 19   Temp: 98.2 F (36.8 C) 97.2 F (36.2 C)  97.7 F (36.5 C)  TempSrc: Oral Oral  Oral  SpO2: 97% 95% 99% 99%  Weight:      Height:       07/31 0701 - 08/01 0700 In: 2058.2 [I.V.:1908.2; IV Piggyback:150] Out: 475 [Urine:475]  Physical Exam: Physical Exam  Constitutional: No distress.  Cardiovascular: Normal rate, regular rhythm and normal heart sounds.   Pulmonary/Chest: Effort normal and breath sounds normal.  Abdominal: Soft. Bowel sounds are normal. There is no tenderness.  Musculoskeletal: He exhibits no edema.  Neurological: He is disoriented (oriented to self and hospital only).  Psychiatric: Cognition and memory are impaired.   Labs: CBC:  Recent Labs Lab 01/16/16 1036 01/16/16 1124 01/17/16 0435  WBC 15.1*  --  12.5*  HGB 17.1* 19.7* 13.6  HCT 52.2* 58.0* 43.1  MCV 81.4  --  81.0  PLT 181  --  163   Metabolic Panel:  Recent Labs Lab 01/16/16 1036  01/16/16 1459 01/16/16 1718 01/16/16 1744 01/16/16 2140 01/17/16 0645  NA 135  < > 136 140 139 140 141  K 4.3  < > 4.3 3.1* 2.9* 3.3* 3.8  CL 91*  < > 97* 98* 98* 101 104  CO2 25  --  21* 26 26 29 28   GLUCOSE 528*  < > 490* 303* 246* 85 143*  BUN 105*  < > 108* 102* 102* 100* 92*  CREATININE 3.67*  < > 3.70* 3.50* 3.50* 3.11* 2.73*  CALCIUM 9.8  --  9.0 9.1 9.2 8.7* 8.7*  ALT 20  --   --   --   --   --   --   ALKPHOS 169*  --   --   --   --   --   --   BILITOT 5.2*  --   --   --   --   --   --   PROT 8.4*  --   --   --   --   --   --   ALBUMIN 3.7   --   --   --   --   --   --   LIPASE 16  --   --   --   --   --   --   LABPROT 17.1*  --   --   --   --   --   --   INR 1.38  --   --   --   --   --   --   < > = values in this interval not displayed. Cardiac Labs:  Recent Labs Lab 01/16/16 1036 01/16/16 1104 01/17/16 0434  TROPIPOC  --  0.19*  --   TROPONINI  --   --  0.13*  BNP 626.2*  --   --    BG:  Recent Labs Lab 01/16/16 2236 01/16/16 2353 01/17/16 0437 01/17/16 0847 01/17/16 1215  GLUCAP 96 112* 148* 131* 205*   Lab Results  Component  Value Date   HGBA1C 8.7 07/20/2015   Microbiology: BCx 1/2 growing MSSA, repeat BCx pending.  Imaging: CXR wnl.   Medications: Infusions: . sodium chloride 75 mL/hr at 01/17/16 0550  . milrinone 0.25 mcg/kg/min (01/17/16 1016)    Scheduled Medications: . heparin  5,000 Units Subcutaneous Q8H  . insulin aspart  0-15 Units Subcutaneous TID WC  . insulin aspart  0-5 Units Subcutaneous QHS  . insulin glargine  5 Units Subcutaneous QHS  . lidocaine  5 mL Intradermal Once  . nafcillin IV  2 g Intravenous Q4H   Assessment/Plan: Pt is a 70 y.o. yo male with a PMHx of CHF who was admitted on 01/16/2016 with symptoms of oliguria, hyperglycemia, and AMS which was determined to be secondary to HHS, dehydration, and bacteremia. Interventions at this time will be focused on treating his bacteremia, gentle rehydration, glycemic control.   Prerenal Azotemia / Uremia: BUN and SCr down-trending with gentle rehydration ON. AG closed. Lactic acidosis resolved. Pt more comfortable w/o complaints, requesting diet. - Reg Diet, NPO after MN for possible TEE - Repeat BMP in AM to follow SCr - Continue gentle IVF, NS @ 73ml/hr  Bacteremia: BCx 1/2 + for MSSA. Leukocytosis from 15.1 now 12.5. PICC removed. Not candidate for ICD removal per HF team. ID, EP, HF teams following. HF knows the patient well and will take point with Goals of Care discussion with family. - Repeat BCx x2 pending - Trend  CBC in AM - Per ID: tx w/ 6 wks Nafcillin IV, then lifelong oral suppression Abx if ICD remains in place - HF to discuss possible TEE w/ family, currently scheduled for tomorrow morning  CHF: LV EF 20-25% w/ ICD. Currently on milrinone through PIV. Now bacteremic, GoC w/ family needed to assess whether ICD removal should be attempted vs lifelong Abx therapy. - Cont milrinone infusion - Watch fluid status - Possible TEE tomorrow morning  DMII: Glucoses under better control. Now on FULL diet with palliation as priority. Restarted insulins today. - Lantus 5U qHS - mSSI qAC/qHS  HTN: Holding home BP meds. BP low currently. IVF bolus as needed.  DVT PPx: - Heparin SQ 5,000 U  Length of Stay: 1 day(s) Dispo: Anticipated discharge when clinically stable. Will need long term IV access for 6 wks Abx therapy.  Adria Devon Pager: [phone] (7AM-5PM) 01/17/2016, 4:15 PM

## 2016-01-17 NOTE — Progress Notes (Signed)
  Date: 01/17/2016  Patient name: Gary Gallegos  Medical record number: [id number]  Date of birth: 01/21/1946   I have seen and evaluated Gary Gallegos and discussed their care with the Residency Team. Gary Gallegos was seen on AM rounds with team. Gary Gallegos is pleasantly confused today so info obtained from Dr Gary Gallegos H&P when the daughter was present. He has non ischemic HF with EF 10-15% on 2016 ECHO and requiring home milrinone infusion. He also has ICD for primary prevention. He came to the ED with his daughter with 2 weeks of feeling poorly. He is now unable to walk up flights of stairs and do his ADL's. He also had decreased urine output, N/V, ABD pain, and confusion. The daughter had been fluid restricting bc she thought he was vol overloaded as had missed some med days due to chaos with anticipated move.   PMHx, Fam Hx, and/or Soc Hx : His daughter is his care giver. PMHx also inc DM II and HTN.  Vitals:   01/17/16 0800 01/17/16 0900  BP: 106/75 117/79  Pulse: (!) 132 100  Resp: 20 20  Temp:  98.2 F (36.8 C)  tachycardia has trended down with most recent while in room at 100. Afebrile Thin man Alert, oriented to name, DOB, place with multiple choice, thought yr 2007. H tachy ABD + BS, soft Ext no edema Moving all 4 extremities L 3rd digit s/p I&D  LABS :  Cr 2.73, down from admit of 3.11, up from baseline of 1.0-1.2 BNP 626 Trop 0.19 - 0.13 Lactic acid 5 - 4.9 - 1.3 WBC 15.1 - 12.5 Blood Cx + staph aureus  I personally viewed his CXR images and confirmed by reading with the official read. No pul edema  I personally viewed his EKG and confirmed by reading with the official read. Sinus tach, inverted T in lateral leads  Assessment and Plan: I have seen and evaluated the patient as outlined above. I agree with the formulated Assessment and Plan as detailed in the residents' note, with the following changes:   Gary Gallegos is a 70 yo with end stage non ischemic HF on home  infusion of milrinone. He is normally independent at home but presents with confusion, ARF, staph aureus bacteremia, and a paronychia presumed acute. The concern is that his bacteria has seeded the PICC line which has been removed and his ICD lines which will not be so easily removed. EP has stated that he is not a candidate for ICD removal and recommends moving towards palliative care, Dr Gary Gallegos has rec a TEE but since he is not an ICD removal candidate, doubt TEE will add much. EP discussed palliative care with family who preferred to defer to HF team. One team should lead the palliative care discussion and our team will coordinate with HF to determine who takes lead in these discussions so as not to give differing advice and conflicting messages.   ACTIVE PROBLEMS  1. Staph bacteremia 2. ICD in place while bactermic 3. Acute encephalopathy 2/2 #1, #4, and #5 4. Volume contraction 2/2 fluid restriction and N/V 5. ARF 2/2 above 6. Chronic HF dependent on milrinone drip 7. DM 8. Acute L 3rd paronychia  PLAN : 1. Discuss with HF end of life issues and which team will lead discussions with pt and family 2. Start diet 3. Cont IVF and follow Cr 4. Cont IVF 5. Finger soaks QID   Gary Spain, MD 8/1/201712:01 PM

## 2016-01-17 NOTE — Progress Notes (Signed)
Inpatient Diabetes Program Recommendations  AACE/ADA: New Consensus Statement on Inpatient Glycemic Control (2015)  Target Ranges:  Prepandial:   less than 140 mg/dL      Peak postprandial:   less than 180 mg/dL (1-2 hours)      Critically ill patients:  140 - 180 mg/dL   Lab Results  Component Value Date   GLUCAP 148 (H) 01/17/2016   HGBA1C 8.7 07/20/2015    Review of Glycemic Control  Results for Gary Gallegos, Gary Gallegos (MRN [id number]) as of 01/17/2016 08:31  Ref. Range 01/16/2016 20:47 01/16/2016 21:20 01/16/2016 22:36 01/16/2016 23:53 01/17/2016 04:37  Glucose-Capillary Latest Ref Range: 65 - 99 mg/dL 43 (LL) 88 96 846 (H) 962 (H)    Diabetes history: Type 2  Outpatient Diabetes medications: Lantus 5 units qhs  Current orders for Inpatient glycemic control: none  Inpatient Diabetes Program Recommendations:    Per ADA recommendations "consider performing an A1C on all patients with diabetes or hypreglycemia admitted to the hospital if not performed in the prior 3 months".  Consider starting Lantus 3 units qhs  Consider creating a custom correction scale q4h CBG 150-200mg /dl=1 unit         201-250mg /dl=2 units         251-300mg /dl=3 units         301-350mg /dl=4 units         952-$WUXLKGMWNUUVOZDG_UYQIHKVQQVZDGLOVFIEPPIRJJOACZYSA$$YTKZSWFUXNATFTDD_UKGURKYHCWCBJSEGBTDVVOHYWVPXTGGY$ mg/dl= 5 units  694-$WNIOEVOJJKKXFGHW_EXHBZJIRCVELFYBOFBPZWCHENIDPOEUM$$PNTIRWERXVQMGQQP_YPPJKDTOIZTIWPYKDXIPJASNKNLZJQBH$, RN, 419-$FXTKWIOXBDZHGDJM_EQASTMHDQQIWLNLGXQJJHERDEYCXKGYJ$$EHUDJSHFWYOVZCHY_IFOYDXAJOINOMVEHMCNOBSJGGEZMOQHU$, >765, CDE Diabetes Coordinator Inpatient Diabetes Program  [phone] (Team Pager) [phone] [facility] Office) 01/17/2016 8:51 AM

## 2016-01-17 NOTE — Progress Notes (Signed)
Advanced Heart Failure Rounding Note  Referring Provider: Dr. Lynelle Doctor Primary Physician:  Pincus Badder, MD Primary Cardiologist:  Dr. [name]   Reason for Admission: A/C systolic HF  Subjective:    Admitted 01/16/16 with N/V, decreased appetite, Acute renal failure, and hyperglycemic nonketotic state. With Elevated WBCs and hypotension concerns for sepsis.  Pan cultured.  Blood cultures + for staph aureus.  Lactic acid 01/16/16 4.9.   WBC 15.1 -> 12.5  Feeing somewhat better today.  Finger still very tender.  Denies SOB at rest.  No lightheadedness or dizziness, but has not been out of bed.   Creatinine improved 3.5 -> 3.1 -> 2.7 Coox 80.6  Objective:   Weight Range: 112 lb (50.8 kg) Body mass index is 17.03 kg/m.   Vital Signs:   Temp:  [97.2 F (36.2 C)-97.8 F (36.6 C)] 97.8 F (36.6 C) (08/01 0436) Pulse Rate:  [105-156] 132 (08/01 0800) Resp:  [15-29] 20 (08/01 0800) BP: (77-114)/(49-86) 106/75 (08/01 0800) SpO2:  [95 %-100 %] 97 % (08/01 0800) Weight:  [112 lb (50.8 kg)-114 lb (51.7 kg)] 112 lb (50.8 kg) (07/31 1314) Last BM Date: 01/12/16  Weight change: Filed Weights   01/16/16 1056 01/16/16 1314  Weight: 114 lb (51.7 kg) 112 lb (50.8 kg)    Intake/Output:   Intake/Output Summary (Last 24 hours) at 01/17/16 0832 Last data filed at 01/17/16 0600  Gross per 24 hour  Intake          2058.17 ml  Output              475 ml  Net          1583.17 ml     Physical Exam: General:  Chronically ill and elderly appearing. Fatigued HEENT: normal Neck: supple. JVD does not appear elevated.. Carotids 2+ bilat; no bruits. No thyromegaly or nodule noted. Cor: PMI laterally displaced. Regular. No rubs. + S3; +3/6 systolic murmur Tunneled PICC in R upper chest.  Lungs: CTAB, normal effort Abdomen: soft, non-tender, non-distended, no HSM. No bruits or masses. +BS  Extremities: no cyanosis, clubbing, rash, edema.  Left hand with paronychia around left middle finger  nail. Neuro: alert & oriented x 3, cranial nerves grossly intact. moves all 4 extremities w/o difficulty. Affect pleasant.  Telemetry: Reviewed, remains in [facility] 130s  Labs: CBC  Recent Labs  01/16/16 1036 01/16/16 1124 01/17/16 0435  WBC 15.1*  --  12.5*  HGB 17.1* 19.7* 13.6  HCT 52.2* 58.0* 43.1  MCV 81.4  --  81.0  PLT 181  --  163   Basic Metabolic Panel  Recent Labs  01/16/16 2140 01/17/16 0645  NA 140 141  K 3.3* 3.8  CL 101 104  CO2 29 28  GLUCOSE 85 143*  BUN 100* 92*  CREATININE 3.11* 2.73*  CALCIUM 8.7* 8.7*   Liver Function Tests  Recent Labs  01/16/16 1036  AST 31  ALT 20  ALKPHOS 169*  BILITOT 5.2*  PROT 8.4*  ALBUMIN 3.7    Recent Labs  01/16/16 1036  LIPASE 16   Cardiac Enzymes No results for input(s): CKTOTAL, CKMB, CKMBINDEX, TROPONINI in the last 72 hours.  BNP: BNP (last 3 results)  Recent Labs  01/18/15 1418 01/16/16 1036  BNP 1,896.9* 626.2*    ProBNP (last 3 results) No results for input(s): PROBNP in the last 8760 hours.   D-Dimer No results for input(s): DDIMER in the last 72 hours. Hemoglobin A1C No results for input(s): HGBA1C  in the last 72 hours. Fasting Lipid Panel No results for input(s): CHOL, HDL, LDLCALC, TRIG, CHOLHDL, LDLDIRECT in the last 72 hours. Thyroid Function Tests No results for input(s): TSH, T4TOTAL, T3FREE, THYROIDAB in the last 72 hours.  Invalid input(s): FREET3  Other results:     Imaging/Studies:  Dg Chest Portable 1 View  Result Date: 01/16/2016 CLINICAL DATA:  Tachycardia EXAM: PORTABLE CHEST 1 VIEW COMPARISON:  January 19, 2015 FINDINGS: Central catheter tip is at the cavoatrial junction. Pacemaker lead is attached to the right ventricle. No pneumothorax. No edema or consolidation. Heart is upper normal in size with pulmonary vascularity within normal limits. No adenopathy. No bone lesions peer IMPRESSION: Central catheter tip at cavoatrial junction. No pneumothorax. No edema  or consolidation. Heart upper normal in size with pulmonary vascularity within normal limits. Electronically Signed   By: Bretta Bang III M.D.   On: 01/16/2016 11:18    Latest Echo  Latest Cath   Medications:     Scheduled Medications: . heparin  5,000 Units Subcutaneous Q8H  . nafcillin  2 g Intravenous Q4H     Infusions: . sodium chloride 75 mL/hr at 01/17/16 0550  . milrinone 0.25 mcg/kg/min (01/16/16 1342)     PRN Medications:  ondansetron **OR** ondansetron (ZOFRAN) IV   Assessment   1. Bacteremia - BCx + with Staph Aureus 2. Chronic systolic CHF 3. Hyperglycemic nonketotic state 4. ARF on CKD stage III 5. Paronychia; left middle finger  Plan    BCx + for Staph aureus.    Per ID will need PICC and ICD removal to cure.   PICC removal will be easily accomplished and replaced, though worry about ICD. Will ask EP their thoughts, unlikely to tolerate AICD extraction. Currently on Vanco/Zosyn for empiric coverage, further per ID.  Creatinine improving with IVF. Will need to give only gentle IVF to prevent overload.   Hyperglycemia treated with insulin drip.  Glucose 143 this am.   Length of Stay: 1  Graciella Freer PA-C 01/17/2016, 8:32 AM  Advanced Heart Failure Team Pager [phone] (M-F; 7a - 4p)  Please contact [facility] for night-coverage after hours (4p -7a ) and weekends on [email or web address]  Patient seen and examined with Otilio Saber, PA-C. We discussed all aspects of the encounter. I agree with the assessment and plan as stated above.   He has end-stage HF now with staph bacteremia in setting of PICC line for chronic milrinone and left middle finger paronychia. Although he has done well on milrinone, he continues with marked decline including 30 pound weight loss over the past year. I do not think he will be able to tolerate ICD extraction easily. I discussed with Dr. Daiva Eves and we agree that best plan will be to remove PICC line. Run  milrinone through PIV. Treat with IV abx. After 48 hours will draw surveillance cx. If remain negative will repalce PICC line and treat for 6 weeks with iv abx followed but oral suppressive therapy lifelong.   I did bedside I&D of tense left 3rd middle finger paronychia. After procedural time out I injected the site with 1& lidocaine and made a linear incision along the nail bed to drain the wound which revealed a deep infection but a clean nail bed. Will continue wound care.   Bensimhon, Daniel,MD 10:24 AM

## 2016-01-17 NOTE — Progress Notes (Signed)
PHARMACY - PHYSICIAN COMMUNICATION CRITICAL VALUE ALERT - BLOOD CULTURE IDENTIFICATION (BCID)  Results for orders placed or performed during the hospital encounter of 01/16/16  Blood Culture ID Panel (Reflexed) (Collected: 01/16/2016  1:05 PM)  Result Value Ref Range   Enterococcus species NOT DETECTED NOT DETECTED   Vancomycin resistance NOT DETECTED NOT DETECTED   Listeria monocytogenes NOT DETECTED NOT DETECTED   Staphylococcus species DETECTED (A) NOT DETECTED   Staphylococcus aureus DETECTED (A) NOT DETECTED   Methicillin resistance NOT DETECTED NOT DETECTED   Streptococcus species NOT DETECTED NOT DETECTED   Streptococcus agalactiae NOT DETECTED NOT DETECTED   Streptococcus pneumoniae NOT DETECTED NOT DETECTED   Streptococcus pyogenes NOT DETECTED NOT DETECTED   Acinetobacter baumannii NOT DETECTED NOT DETECTED   Enterobacteriaceae species NOT DETECTED NOT DETECTED   Enterobacter cloacae complex NOT DETECTED NOT DETECTED   Escherichia coli NOT DETECTED NOT DETECTED   Klebsiella oxytoca NOT DETECTED NOT DETECTED   Klebsiella pneumoniae NOT DETECTED NOT DETECTED   Proteus species NOT DETECTED NOT DETECTED   Serratia marcescens NOT DETECTED NOT DETECTED   Carbapenem resistance NOT DETECTED NOT DETECTED   Haemophilus influenzae NOT DETECTED NOT DETECTED   Neisseria meningitidis NOT DETECTED NOT DETECTED   Pseudomonas aeruginosa NOT DETECTED NOT DETECTED   Candida albicans NOT DETECTED NOT DETECTED   Candida glabrata NOT DETECTED NOT DETECTED   Candida krusei NOT DETECTED NOT DETECTED   Candida parapsilosis NOT DETECTED NOT DETECTED   Candida tropicalis NOT DETECTED NOT DETECTED    Name of physician (or Provider) Contacted: Dr Peggyann Juba by text page  Changes to prescribed antibiotics required: May be contaminant, already on vanc for ?sepsis and abscess, no need to change until appropriate to narrow therapy.  Vernard Gambles, PharmD, BCPS  01/17/2016  6:29 AM

## 2016-01-17 NOTE — Consult Note (Signed)
ELECTROPHYSIOLOGY CONSULT NOTE    Patient ID: Gary Gallegos MRN: [id number], DOB/AGE: Jan 03, 1946 70 y.o.  Admit date: 01/16/2016 Date of Consult: 01/17/2016  Primary Physician: Pincus Badder, MD Primary Cardiologist: [name] Requesting MD: Bensimhon/[place]  Reason for Consultation: staph bacteremia   HPI:  Gary Gallegos is a 70 y.o. male with a past medical history significant for hypertension, NICM, s/p MDT ICD, chronic systolic heart failure, and inotrope dependence. He presented to the hospital on 01/16/16 with nausea, vomiting, and decreased appetite.  Blood cultures are positive for staph bacteremia. EP has been asked to evaluate for treatment options.  Echo 01/2015 demonstrated EF 10-15%, diffuse hypokinesis, mild pericardial effusion.    He has been seen by ID who feels that he will be unable to clear his infection without removal of ICD and PICC line.    He currently denies shortness of breath at rest, chest pain, LE edema, recent palpitations, or syncope.   Past Medical History:  Diagnosis Date  . AICD (automatic cardioverter/defibrillator) present   . Biventricular CHF (congestive heart failure) (HCC)    a. Echo 8/16:  EF 10-15%, diff HK, trivial AI, mild MR, mild LAE, mod RVE, mild reduced RVF, mild TR, PASP 40 mmHg, mild eff without tamponade.   . Blind left eye   . Depression   . GERD (gastroesophageal reflux disease)   . Hyperlipidemia   . Hypertension   . Impaired vision in both eyes   . NICM (nonischemic cardiomyopathy) (HCC)   . Type II diabetes mellitus (HCC)      Surgical History:  Past Surgical History:  Procedure Laterality Date  . CARDIAC CATHETERIZATION    . CARDIAC DEFIBRILLATOR PLACEMENT    . ESOPHAGOGASTRODUODENOSCOPY N/A 07/09/2013   Procedure: ESOPHAGOGASTRODUODENOSCOPY (EGD);  Surgeon: Hart Carwin, MD;  Location: [facility] ENDOSCOPY;  Service: Endoscopy;  Laterality: N/A;  . RIGHT HEART CATHETERIZATION N/A 07/08/2013   Procedure: RIGHT HEART CATH;   Surgeon: Laurey Morale, MD;  Location: [facility] CATH LAB;  Service: Cardiovascular;  Laterality: N/A;     Prescriptions Prior to Admission  Medication Sig Dispense Refill Last Dose  . aspirin EC 81 MG tablet Take 81 mg by mouth daily.   01/15/2016 at Unknown time  . carvedilol (COREG) 3.125 MG tablet TAKE 1 TABLET BY MOUTH TWO TIMES DAILY WITH A MEAL 60 tablet 3 01/15/2016 at 2130  . Insulin Glargine (LANTUS) 100 UNIT/ML Solostar Pen Inject 5 Units into the skin daily at 10 pm. 15 mL 11 01/15/2016 at Unknown time  . Insulin Pen Needle 31G X 4 MM MISC 1 Units by Does not apply route daily. 100 each 3 Taking  . Magnesium 200 MG TABS Take 200 mg by mouth daily.   01/15/2016 at Unknown time  . metolazone (ZAROXOLYN) 5 MG tablet Take 1 tablet (5 mg total) by mouth daily as needed (shortness of breath/edema). As directed by HF clinic.   01/11/2016 at Unknown time  . pantoprazole (PROTONIX) 20 MG tablet Take 1 tablet (20 mg total) by mouth daily. 90 tablet 3 Past Week at Unknown time  . potassium chloride SA (K-DUR,KLOR-CON) 20 MEQ tablet Take 2 tablets (40 mEq total) by mouth 3 (three) times daily.   01/15/2016 at Unknown time  . sertraline (ZOLOFT) 50 MG tablet Take 1 tablet (50 mg total) by mouth daily. 90 tablet 3 01/15/2016 at Unknown time  . simvastatin (ZOCOR) 5 MG tablet Take 1 tablet (5 mg total) by mouth at bedtime. 30 tablet 6 01/15/2016  at Unknown time  . torsemide (DEMADEX) 20 MG tablet Take 60 mg (3 tabs) in am and 40 mg (2 tabs) in pm. 150 tablet 6 01/15/2016 at Unknown time  . milrinone (PRIMACOR) 20 MG/100ML SOLN infusion Inject 25.875 mcg/min into the vein continuous. 100 mL 999 continuous  . mirtazapine (REMERON) 15 MG tablet Take 0.5 tablets (7.5 mg total) by mouth at bedtime. (Patient not taking: Reported on 11/24/2015) 45 tablet 1 Not Taking    Inpatient Medications:  . heparin  5,000 Units Subcutaneous Q8H  . lidocaine  5 mL Intradermal Once  . nafcillin IV  2 g Intravenous Once     Allergies:  Allergies  Allergen Reactions  . Penicillin G Hives    Tolerated Zosyn in 12/2015  . Fluorometholone Rash    Eye drop -> rash  . Penicillins Nausea And Vomiting  . Sulfa Antibiotics Nausea And Vomiting  . Sulfamethoxazole-Trimethoprim Nausea Only    Social History   Social History  . Marital status: Widowed    Spouse name: N/A  . Number of children: N/A  . Years of education: N/A   Occupational History  . Not on file.   Social History Main Topics  . Smoking status: Never Smoker  . Smokeless tobacco: Never Used     Comment: "stopped smoking in the 1970's"  . Alcohol use No  . Drug use: No  . Sexual activity: No   Other Topics Concern  . Not on file   Social History Narrative  . No narrative on file     Family History  Problem Relation Age of Onset  . Heart disease Father      Review of Systems: All other systems reviewed and are otherwise negative except as noted above.  Physical Exam: Vitals:   01/17/16 0700 01/17/16 0730 01/17/16 0800 01/17/16 0900  BP: 114/76 105/65 106/75 117/79  Pulse: (!) 135  (!) 132 100  Resp: (!) 26 (!) 21 20 20   Temp:    98.2 F (36.8 C)  TempSrc:    Oral  SpO2: 100% 99% 97% 97%  Weight:      Height:        GEN- The patient is chronically ill appearing, alert and oriented x 3 today.   HEENT: normocephalic, atraumatic; sclera clear, conjunctiva pink; hearing intact; oropharynx clear; neck supple  Lungs- Clear to ausculation bilaterally, normal work of breathing.  No wheezes, rales, rhonchi Heart- Regular rate and rhythm, +3/6 SEM GI- soft, non-tender, non-distended, bowel sounds present  Extremities- no clubbing, cyanosis, or edema  MS- no significant deformity or atrophy Skin- warm and dry, no rash or lesion Psych- euthymic mood, full affect Neuro- strength and sensation are intact  Labs:   Lab Results  Component Value Date   WBC 12.5 (H) 01/17/2016   HGB 13.6 01/17/2016   HCT 43.1 01/17/2016    MCV 81.0 01/17/2016   PLT 163 01/17/2016    Recent Labs Lab 01/16/16 1036  01/17/16 0645  NA 135  < > 141  K 4.3  < > 3.8  CL 91*  < > 104  CO2 25  < > 28  BUN 105*  < > 92*  CREATININE 3.67*  < > 2.73*  CALCIUM 9.8  < > 8.7*  PROT 8.4*  --   --   BILITOT 5.2*  --   --   ALKPHOS 169*  --   --   ALT 20  --   --   AST 31  --   --  GLUCOSE 528*  < > 143*  < > = values in this interval not displayed.    Radiology/Studies: Dg Chest Portable 1 View Result Date: 01/16/2016 CLINICAL DATA:  Tachycardia EXAM: PORTABLE CHEST 1 VIEW COMPARISON:  January 19, 2015 FINDINGS: Central catheter tip is at the cavoatrial junction. Pacemaker lead is attached to the right ventricle. No pneumothorax. No edema or consolidation. Heart is upper normal in size with pulmonary vascularity within normal limits. No adenopathy. No bone lesions peer IMPRESSION: Central catheter tip at cavoatrial junction. No pneumothorax. No edema or consolidation. Heart upper normal in size with pulmonary vascularity within normal limits. Electronically Signed   By: Bretta Bang III M.D.   On: 01/16/2016 11:18   WUJ:WJXBJ tach, rate 136  TELEMETRY: sinus rhythm/sinus tach   DEVICE HISTORY: MDT single chamber dual coil ICD implanted 2013  Assessment/Plan: 1.  Staph bacteremia The patient has been found to have staph bacteremia with ICD and PICC in place. I agree that with milrinone dependent end stage HF, he is not a candidate for ICD system extraction.   Would recommend palliative care consult and consideration of deactivation of ICD therapies.  I have brought this up with the patient and family today but will defer to HF team who knows him well.   2.  NICM/chronic systolic heart failure Management per HF team  3.  Ventricular tachycardia 2 pace terminated VT episodes 02/2015 With milrinone dependent end stage HF, would consider deactivation of tachy therapies as above  Dr Johney Frame to see later today   Signed, Gypsy Balsam, NP 01/17/2016 11:31 AM  I have seen, examined the patient, and reviewed the above assessment and plan. On exam, he is elderly and frail.  Changes to above are made where necessary.  Though I agree with ID that ICD system removal is required for adequate treatment of his bacteremia, I also agree with Dr [name] that he is a very poor candidate for ICD system extraction.  Would consider palliative care consultation.  Pt and family do not seem eager to have aggressive intervention at this time.  Electrophysiology team to see as needed while here. Please call with questions.   Co Sign: Hillis Range, MD 01/17/2016 8:59 PM

## 2016-01-18 ENCOUNTER — Other Ambulatory Visit (HOSPITAL_COMMUNITY): Payer: Medicare HMO

## 2016-01-18 ENCOUNTER — Encounter (HOSPITAL_COMMUNITY): Payer: Self-pay | Admitting: General Surgery

## 2016-01-18 ENCOUNTER — Encounter (HOSPITAL_COMMUNITY)
Admission: EM | Disposition: A | Discharge status: Hospice | Payer: Self-pay | Source: Home / Self Care | Attending: Internal Medicine

## 2016-01-18 ENCOUNTER — Inpatient Hospital Stay (HOSPITAL_COMMUNITY): Payer: Medicare HMO

## 2016-01-18 DIAGNOSIS — Z9889 Other specified postprocedural states: Secondary | ICD-10-CM

## 2016-01-18 DIAGNOSIS — B9561 Methicillin susceptible Staphylococcus aureus infection as the cause of diseases classified elsewhere: Secondary | ICD-10-CM

## 2016-01-18 DIAGNOSIS — N178 Other acute kidney failure: Secondary | ICD-10-CM

## 2016-01-18 HISTORY — PX: IR GENERIC HISTORICAL: IMG1180011

## 2016-01-18 LAB — BASIC METABOLIC PANEL
ANION GAP: 9 (ref 5–15)
BUN: 64 mg/dL — ABNORMAL HIGH (ref 6–20)
CALCIUM: 8.2 mg/dL — AB (ref 8.9–10.3)
CO2: 27 mmol/L (ref 22–32)
CREATININE: 1.77 mg/dL — AB (ref 0.61–1.24)
Chloride: 106 mmol/L (ref 101–111)
GFR calc Af Amer: 44 mL/min — ABNORMAL LOW (ref >60)
GFR, EST NON AFRICAN AMERICAN: 38 mL/min — AB (ref >60)
GLUCOSE: 115 mg/dL — AB (ref 65–99)
Potassium: 3 mmol/L — ABNORMAL LOW (ref 3.5–5.1)
Sodium: 142 mmol/L (ref 135–145)

## 2016-01-18 LAB — CARBOXYHEMOGLOBIN
Carboxyhemoglobin: 2 % — ABNORMAL HIGH (ref 0.5–1.5)
Methemoglobin: 0.6 % (ref 0.0–1.5)
O2 Saturation: 79.8 %
Total hemoglobin: 12.8 g/dL — ABNORMAL LOW (ref 13.5–18.0)

## 2016-01-18 LAB — CBC
HCT: 40.7 % (ref 39.0–52.0)
Hemoglobin: 12.6 g/dL — ABNORMAL LOW (ref 13.0–17.0)
MCH: 25.4 pg — ABNORMAL LOW (ref 26.0–34.0)
MCHC: 31 g/dL (ref 30.0–36.0)
MCV: 81.9 fL (ref 78.0–100.0)
PLATELETS: 144 10*3/uL — AB (ref 150–400)
RBC: 4.97 MIL/uL (ref 4.22–5.81)
RDW: 18.5 % — AB (ref 11.5–15.5)
WBC: 7.2 10*3/uL (ref 4.0–10.5)

## 2016-01-18 LAB — URINE CULTURE

## 2016-01-18 LAB — GLUCOSE, CAPILLARY
GLUCOSE-CAPILLARY: 192 mg/dL — AB (ref 65–99)
GLUCOSE-CAPILLARY: 159 mg/dL — AB (ref 65–99)
Glucose-Capillary: 109 mg/dL — ABNORMAL HIGH (ref 65–99)
Glucose-Capillary: 156 mg/dL — ABNORMAL HIGH (ref 65–99)

## 2016-01-18 SURGERY — ECHOCARDIOGRAM, TRANSESOPHAGEAL
Anesthesia: Moderate Sedation

## 2016-01-18 MED ORDER — POTASSIUM CHLORIDE CRYS ER 20 MEQ PO TBCR
40.0000 meq | EXTENDED_RELEASE_TABLET | ORAL | Status: AC
  Administered 2016-01-18 (×2): 40 meq via ORAL
  Filled 2016-01-18 (×2): qty 2

## 2016-01-18 MED ORDER — ACETAMINOPHEN 325 MG PO TABS: 650.0000 mg | ORAL_TABLET | Freq: Four times a day (QID) | ORAL | Status: DC | PRN

## 2016-01-18 MED ORDER — SODIUM CHLORIDE 0.9 % IV SOLN: INTRAVENOUS | Status: DC

## 2016-01-18 NOTE — Procedures (Signed)
Successful removal of tunneled PICC line.  Cuff remains in place.  No immediate complications.  Mustapha Colson E 4:40 PM 01/18/2016

## 2016-01-18 NOTE — Progress Notes (Signed)
I spoke with patient's daughter, Bodee Por, and patient's sister, Carolynne Edouard today regarding her care. I discussed with them the father's current clinical situation. Noting that in terms of his kidney function, blood sugar, and general hydration he was much improved. However I explained that his most pressure issue was the bacteria growing in his blood.  I explained that his bacteremia presented problems for his current indwelling catheter and his ICD. I noted that the catheter was removed today however the ICD, on consultation with heart failure and EP teams would not be easy to remove. In the course of discussion I explained that the decision tree resulted in 2 options.  Option 1: The ICD remains in place and Mr. Womble receives 6 weeks of IV antibiotics, and lifelong oral antibiotic suppression. I appointment with this option Mr. Stidham would likely continue with his current trajectory of worsening heart failure. Additionally, I explained that he would suffer from the side effects of chronic antibiotic treatment including GI upset, diarrhea, and risk of C. difficile infection.  Option 2: Evaluate the ICD with TEE, in order to determine whether there is significant vegetation present. I described that in this case, the heart failure team may be more in favor of removing the ICD, in order to increase the chance of curing his bacteremia. In the event that his ICD were to be removed, I explained that this would entail the risks of ICD removal, as well as the significant increased risk of sudden cardiac death. However this approach may also allow for the definitive cure of his bacteremia.  I clarified any questions at the patient's daughter and sister had about these options, and the patient's current situation. They endorsed understanding of these options, and were agreeable to taking some time to think about their decision. However in the interim her conversation they didn't disclose that they were  more in favor of a conservative approach with antibiotics versus ICD removal. We will continue to follow up on the situation. For the time being I recommend that we defer TEE.   I clarified that the heart failure team was more than willing to speak without if it had any questions or concerns he would feel more comfortable addressing with this team. Patient's daughter and sister were appreciative of my call. I thanked them for the time and made plans to follow up with him in the next 1-2 days.

## 2016-01-18 NOTE — Progress Notes (Signed)
Pt transferred per bed to 3 west bed 13 per nurse techs with all belongings.

## 2016-01-18 NOTE — Progress Notes (Signed)
Called report to 3 [facility] Spoke to [employer]. Pt will go to IR first from [facility] bed 5 than got to 3 West bed 13as a transfer per bed Milrinone running at 3.9 ml/hour

## 2016-01-18 NOTE — Progress Notes (Signed)
Subjective: No new complaints   Antibiotics:  Anti-infectives    Start     Dose/Rate Route Frequency Ordered Stop   01/17/16 1430  nafcillin 2 g in dextrose 5 % 100 mL IVPB     2 g 200 mL/hr over 30 Minutes Intravenous Every 4 hours 01/17/16 1258     01/17/16 0945  nafcillin 2 g in dextrose 5 % 100 mL IVPB     2 g 200 mL/hr over 30 Minutes Intravenous  Once 01/17/16 0943 01/17/16 1146   01/17/16 0900  nafcillin injection 2 g  Status:  Discontinued     2 g Intravenous Every 4 hours 01/17/16 0827 01/17/16 0912   01/16/16 1500  vancomycin (VANCOCIN) IVPB 750 mg/150 ml premix  Status:  Discontinued     750 mg 150 mL/hr over 60 Minutes Intravenous Every 48 hours 01/16/16 1348 01/17/16 0827   01/16/16 1400  piperacillin-tazobactam (ZOSYN) IVPB 2.25 g  Status:  Discontinued     2.25 g 100 mL/hr over 30 Minutes Intravenous Every 8 hours 01/16/16 1348 01/17/16 0827      Medications: Scheduled Meds: . heparin  5,000 Units Subcutaneous Q8H  . insulin aspart  0-15 Units Subcutaneous TID WC  . insulin aspart  0-5 Units Subcutaneous QHS  . insulin glargine  5 Units Subcutaneous QHS  . lidocaine  5 mL Intradermal Once  . nafcillin IV  2 g Intravenous Q4H  . potassium chloride  40 mEq Oral Q4H   Continuous Infusions: . sodium chloride 75 mL/hr at 01/17/16 0550  . sodium chloride    . milrinone 0.25 mcg/kg/min (01/17/16 1016)   PRN Meds:.acetaminophen, [COMPLETED] nafcillin IV **AND** diphenhydrAMINE, ondansetron **OR** ondansetron (ZOFRAN) IV    Objective: Weight change: 14 lb 1.4 oz (6.39 kg)  Intake/Output Summary (Last 24 hours) at 01/18/16 1259 Last data filed at 01/18/16 1211  Gross per 24 hour  Intake           3849.5 ml  Output             1150 ml  Net           2699.5 ml   Blood pressure 131/81, pulse 95, temperature 98 F (36.7 C), temperature source Axillary, resp. rate (!) 27, height 5\' 8"  (1.727 m), weight 128 lb 1.4 oz (58.1 kg), SpO2 100 %. Temp:   [97.5 F (36.4 C)-98.1 F (36.7 C)] 98 F (36.7 C) (08/02 1203) Pulse Rate:  [92-104] 95 (08/02 1100) Resp:  [10-30] 27 (08/02 1203) BP: (79-131)/(60-84) 131/81 (08/02 1203) SpO2:  [96 %-100 %] 100 % (08/02 1203) Weight:  [128 lb 1.4 oz (58.1 kg)] 128 lb 1.4 oz (58.1 kg) (08/02 0424)  Physical Exam: General: Alert and awake, oriented person and thinks he is in [place] HEENT: anicteric sclera,  EOMI, oropharynx clear and without exudate Cardiovascular: regular rate, normal r,  no murmur rubs or gallops heard with disposable stethoscope Pulmonary: clear to auscultation bilaterally, no wheezing, rales or rhonchi Gastrointestinal: soft nontender, nondistended, normal bowel sounds, Musculoskeletal: no  clubbing or edema noted bilaterally Skin,   Midline in place on chest, ICD without overt infection (though that means nothing with bacteremia)  His finger  01/17/16:    Left finger 01/18/16: sp I and  D by Dr. Teressa Lower     Neuro: nonfocal  CBC: CBC Latest Ref Rng & Units 01/18/2016 01/17/2016 01/16/2016  WBC 4.0 - 10.5 K/uL 7.2 12.5(H) -  Hemoglobin 13.0 - 17.0  g/dL 12.6(L) 13.6 19.7(H)  Hematocrit 39.0 - 52.0 % 40.7 43.1 58.0(H)  Platelets 150 - 400 K/uL 144(L) 163 -      BMET  Recent Labs  01/17/16 0645 01/18/16 0425  NA 141 142  K 3.8 3.0*  CL 104 106  CO2 28 27  GLUCOSE 143* 115*  BUN 92* 64*  CREATININE 2.73* 1.77*  CALCIUM 8.7* 8.2*     Liver Panel   Recent Labs  01/16/16 1036  PROT 8.4*  ALBUMIN 3.7  AST 31  ALT 20  ALKPHOS 169*  BILITOT 5.2*       Sedimentation Rate No results for input(s): ESRSEDRATE in the last 72 hours. C-Reactive Protein No results for input(s): CRP in the last 72 hours.  Micro Results: Recent Results (from the past 720 hour(s))  Culture, blood (Routine X 2) w Reflex to ID Panel     Status: Abnormal (Preliminary result)   Collection Time: 01/16/16  1:05 PM  Result Value Ref Range Status   Specimen Description  BLOOD RIGHT FOREARM  Final   Special Requests BOTTLES DRAWN AEROBIC AND ANAEROBIC  Final   Culture  Setup Time   Final    GRAM POSITIVE COCCI IN CLUSTERS ANAEROBIC BOTTLE ONLY CRITICAL RESULT CALLED TO, READ BACK BY AND VERIFIED WITHMerlene Morse PHARMD 0617 01/17/16 A BROWNING    Culture (A)  Final    STAPHYLOCOCCUS AUREUS SUSCEPTIBILITIES TO FOLLOW    Report Status PENDING  Incomplete  Blood Culture ID Panel (Reflexed)     Status: Abnormal   Collection Time: 01/16/16  1:05 PM  Result Value Ref Range Status   Enterococcus species NOT DETECTED NOT DETECTED Final   Vancomycin resistance NOT DETECTED NOT DETECTED Final   Listeria monocytogenes NOT DETECTED NOT DETECTED Final   Staphylococcus species DETECTED (A) NOT DETECTED Final    Comment: CRITICAL RESULT CALLED TO, READ BACK BY AND VERIFIED WITH: V BRYK PHARMD 0617 01/17/16 A BROWNING    Staphylococcus aureus DETECTED (A) NOT DETECTED Final    Comment: CRITICAL RESULT CALLED TO, READ BACK BY AND VERIFIED WITHMerlene Morse PHARMD 0617 01/17/16 A BROWNING    Methicillin resistance NOT DETECTED NOT DETECTED Final   Streptococcus species NOT DETECTED NOT DETECTED Final   Streptococcus agalactiae NOT DETECTED NOT DETECTED Final   Streptococcus pneumoniae NOT DETECTED NOT DETECTED Final   Streptococcus pyogenes NOT DETECTED NOT DETECTED Final   Acinetobacter baumannii NOT DETECTED NOT DETECTED Final   Enterobacteriaceae species NOT DETECTED NOT DETECTED Final   Enterobacter cloacae complex NOT DETECTED NOT DETECTED Final   Escherichia coli NOT DETECTED NOT DETECTED Final   Klebsiella oxytoca NOT DETECTED NOT DETECTED Final   Klebsiella pneumoniae NOT DETECTED NOT DETECTED Final   Proteus species NOT DETECTED NOT DETECTED Final   Serratia marcescens NOT DETECTED NOT DETECTED Final   Carbapenem resistance NOT DETECTED NOT DETECTED Final   Haemophilus influenzae NOT DETECTED NOT DETECTED Final   Neisseria meningitidis NOT DETECTED NOT DETECTED  Final   Pseudomonas aeruginosa NOT DETECTED NOT DETECTED Final   Candida albicans NOT DETECTED NOT DETECTED Final   Candida glabrata NOT DETECTED NOT DETECTED Final   Candida krusei NOT DETECTED NOT DETECTED Final   Candida parapsilosis NOT DETECTED NOT DETECTED Final   Candida tropicalis NOT DETECTED NOT DETECTED Final  Culture, blood (Routine X 2) w Reflex to ID Panel     Status: None (Preliminary result)   Collection Time: 01/16/16  1:22 PM  Result Value Ref  Range Status   Specimen Description LEFT ANTECUBITAL  Final   Special Requests BOTTLES DRAWN AEROBIC AND ANAEROBIC  Final   Culture NO GROWTH < 24 HOURS  Final   Report Status PENDING  Incomplete  MRSA PCR Screening     Status: None   Collection Time: 01/16/16  6:22 PM  Result Value Ref Range Status   MRSA by PCR NEGATIVE NEGATIVE Final    Comment:        The GeneXpert MRSA Assay (FDA approved for NASAL specimens only), is one component of a comprehensive MRSA colonization surveillance program. It is not intended to diagnose MRSA infection nor to guide or monitor treatment for MRSA infections.   Culture, Urine     Status: Abnormal   Collection Time: 01/16/16  8:58 PM  Result Value Ref Range Status   Specimen Description URINE, RANDOM  Final   Special Requests NONE  Final   Culture MULTIPLE SPECIES PRESENT, SUGGEST RECOLLECTION (A)  Final   Report Status 01/18/2016 FINAL  Final  Culture, blood (Routine X 2) w Reflex to ID Panel     Status: None (Preliminary result)   Collection Time: 01/17/16 10:31 AM  Result Value Ref Range Status   Specimen Description BLOOD RIGHT HAND  Final   Special Requests IN PEDIATRIC BOTTLE  2CC  Final   Culture NO GROWTH < 12 HOURS  Final   Report Status PENDING  Incomplete  Culture, blood (Routine X 2) w Reflex to ID Panel     Status: None (Preliminary result)   Collection Time: 01/17/16 10:32 AM  Result Value Ref Range Status   Specimen Description BLOOD RIGHT HAND  Final   Special  Requests BOTTLES DRAWN AEROBIC AND ANAEROBIC  5CC  Final   Culture NO GROWTH < 12 HOURS  Final   Report Status PENDING  Incomplete    Studies/Results: No results found.    Assessment/Plan:  INTERVAL HISTORY: paronychia area incised drained   Active Problems:   Acute renal failure (ARF) (HCC)   Sepsis due to Methicillin susceptible Staphylococcus aureus (HCC)   ICD (implantable cardioverter-defibrillator) infection (HCC)   Paronychia   PICC line infection   Protein-calorie malnutrition, severe   Bacteremia   Paronychia of third finger, left   Ventricular tachycardia (HCC)    Gary Gallegos is a 70 y.o. male with  SAB septic shock with AICD, paronychia, Mid line  #1.                                                                          [facility]icrobial Management Team Staphylococcus aureus bacteremia  Staphylococcus aureus bacteremia (SAB) is associated with a high rate of complications and mortality.  Specific aspects of clinical management are critical to optimizing the outcome of patients with SAB.  Therefore, the [facility] Antimicrobial Management Team [facility]) has initiated an intervention aimed at improving the management of SAB at [facility].  To do so, Infectious Diseases physicians are providing an evidence-based consult for the management of all patients with SAB.     Yes No Comments  Perform follow-up blood cultures (even if the patient is afebrile) to ensure clearance of bacteremia [x]  []  He would also need them drawn  AFTER PICC removal  (ICD removal not planned)  Remove vascular catheter and obtain follow-up blood cultures after the removal of the catheter [x]  []  Give line holiday and when repeat blood cultures remain negative x 72 hours preferably then place new pICC  Perform echocardiography to evaluate for endocarditis (transthoracic ECHO is 40-50% sensitive, TEE is > 90% sensitive) []  []  TEE not being pursued  Consult electrophysiologist to  evaluate implanted cardiac device (pacemaker, ICD) [x]  []  AICD extraction not planned  Ensure source control []  []  Have all abscesses been drained effectively? Have deep seeded infections (septic joints or osteomyelitis) had appropriate surgical debridement?  Finger could be source. Continue to follow    Investigate for "metastatic" sites of infection []  []  Does the patient have ANY symptom or physical exam finding that would suggest a deeper infection (back or neck pain that may be suggestive of vertebral osteomyelitis or epidural abscess, muscle pain that could be a symptom of pyomyositis)?  Keep in mind that for deep seeded infections MRI imaging with contrast is preferred rather than other often insensitive tests such as plain x-rays, especially early in a patient's presentation. No other obvious sources  Change antibiotic therapy to   Nafcillin without problems so far     []  []  Beta-lactam antibiotics are preferred for MSSA due to higher cure rates.   If on Vancomycin, goal trough should be 15 - 20 mcg/mL  Estimated duration of IV antibiotic therapy:  6 weeks after AICD device extraction then change to po suppressive antibiotics for life [x]  []  Consult case management for probably prolonged outpatient IV antibiotic therapy   Dr. Orvan Falconer is covering for me tomorrow and I will be back on Friday.    LOS: 2 days   Gary Gallegos 01/18/2016, 12:59 PM

## 2016-01-18 NOTE — Progress Notes (Signed)
Advanced Heart Failure Rounding Note  Referring Provider: Dr. Lynelle Doctor Primary Physician:  Pincus Badder, MD Primary Cardiologist:  Dr. [name]   Reason for Admission: A/C systolic HF  Subjective:    Admitted 01/16/16 with N/V, decreased appetite, Acute renal failure, and hyperglycemic nonketotic state. With Elevated WBCs and hypotension concerns for sepsis.  Pan cultured.  Blood cultures + for staph aureus.  Lactic acid 01/16/16 4.9.   WBC 15.1   Now nafcillin. Afebrile. Sleeping this am. A bit confused. CVP 5 after hydration. Co-ox 80%  Creatinine much improved   Objective:   Weight Range: 58.1 kg (128 lb 1.4 oz) Body mass index is 19.48 kg/m.   Vital Signs:   Temp:  [97.2 F (36.2 C)-98.1 F (36.7 C)] 98.1 F (36.7 C) (08/02 0803) Pulse Rate:  [92-104] 92 (08/02 1000) Resp:  [11-30] 19 (08/02 1000) BP: (79-122)/(60-77) 112/60 (08/02 1000) SpO2:  [95 %-99 %] 97 % (08/02 1000) Weight:  [58.1 kg (128 lb 1.4 oz)] 58.1 kg (128 lb 1.4 oz) (08/02 0424) Last BM Date: 01/18/16  Weight change: Filed Weights   01/16/16 1056 01/16/16 1314 01/18/16 0424  Weight: 51.7 kg (114 lb) 50.8 kg (112 lb) 58.1 kg (128 lb 1.4 oz)    Intake/Output:   Intake/Output Summary (Last 24 hours) at 01/18/16 1029 Last data filed at 01/18/16 1017  Gross per 24 hour  Intake           3007.3 ml  Output              750 ml  Net           2257.3 ml     Physical Exam: General:  Chronically ill and elderly appearing. Sleeping HEENT: normal Neck: supple. JVD does not appear elevated.. Carotids 2+ bilat; no bruits. No thyromegaly or nodule noted. Cor: PMI laterally displaced. Regular. No rubs. + S3; +3/6 systolic murmur Tunneled PICC in R upper chest.  Lungs: CTAB, normal effort Abdomen: soft, non-tender, non-distended, no HSM. No bruits or masses. +BS  Extremities: no cyanosis, clubbing, rash, edema.  Left 3rd finger with dressing Neuro: alert & oriented x 3, cranial nerves grossly intact.  moves all 4 extremities w/o difficulty. Affect pleasant.  Telemetry: NSR 80-90s  Labs: CBC  Recent Labs  01/17/16 0435 01/18/16 0425  WBC 12.5* 7.2  HGB 13.6 12.6*  HCT 43.1 40.7  MCV 81.0 81.9  PLT 163 144*   Basic Metabolic Panel  Recent Labs  01/17/16 0645 01/18/16 0425  NA 141 142  K 3.8 3.0*  CL 104 106  CO2 28 27  GLUCOSE 143* 115*  BUN 92* 64*  CREATININE 2.73* 1.77*  CALCIUM 8.7* 8.2*   Liver Function Tests  Recent Labs  01/16/16 1036  AST 31  ALT 20  ALKPHOS 169*  BILITOT 5.2*  PROT 8.4*  ALBUMIN 3.7    Recent Labs  01/16/16 1036  LIPASE 16   Cardiac Enzymes  Recent Labs  01/17/16 0434 01/17/16 1630 01/17/16 2005  TROPONINI 0.13* 0.08* 0.08*    BNP: BNP (last 3 results)  Recent Labs  01/18/15 1418 01/16/16 1036  BNP 1,896.9* 626.2*    ProBNP (last 3 results) No results for input(s): PROBNP in the last 8760 hours.   D-Dimer No results for input(s): DDIMER in the last 72 hours. Hemoglobin A1C No results for input(s): HGBA1C in the last 72 hours. Fasting Lipid Panel No results for input(s): CHOL, HDL, LDLCALC, TRIG, CHOLHDL, LDLDIRECT in the last  72 hours. Thyroid Function Tests No results for input(s): TSH, T4TOTAL, T3FREE, THYROIDAB in the last 72 hours.  Invalid input(s): FREET3  Other results:     Imaging/Studies:  Dg Chest Portable 1 View  Result Date: 01/16/2016 CLINICAL DATA:  Tachycardia EXAM: PORTABLE CHEST 1 VIEW COMPARISON:  January 19, 2015 FINDINGS: Central catheter tip is at the cavoatrial junction. Pacemaker lead is attached to the right ventricle. No pneumothorax. No edema or consolidation. Heart is upper normal in size with pulmonary vascularity within normal limits. No adenopathy. No bone lesions peer IMPRESSION: Central catheter tip at cavoatrial junction. No pneumothorax. No edema or consolidation. Heart upper normal in size with pulmonary vascularity within normal limits. Electronically Signed    By: Bretta Bang III M.D.   On: 01/16/2016 11:18   Latest Echo  Latest Cath   Medications:     Scheduled Medications: . heparin  5,000 Units Subcutaneous Q8H  . insulin aspart  0-15 Units Subcutaneous TID WC  . insulin aspart  0-5 Units Subcutaneous QHS  . insulin glargine  5 Units Subcutaneous QHS  . lidocaine  5 mL Intradermal Once  . nafcillin IV  2 g Intravenous Q4H  . potassium chloride  40 mEq Oral Q4H    Infusions: . sodium chloride 75 mL/hr at 01/17/16 0550  . sodium chloride    . milrinone 0.25 mcg/kg/min (01/17/16 1016)    PRN Medications: acetaminophen, [COMPLETED] nafcillin IV **AND** diphenhydrAMINE, ondansetron **OR** ondansetron (ZOFRAN) IV   Assessment   1. Bacteremia - BCx + with Staph Aureus 2. Chronic systolic CHF 3. Hyperglycemic nonketotic state 4. ARF on CKD stage III 5. Paronychia; left middle finger  Plan     He has end-stage HF now with staph bacteremia in setting of PICC line for chronic milrinone and left middle finger paronychia. Although he has done well on milrinone, he continues with marked decline including 30 pound weight loss over the past year. I do not think he will be able to tolerate ICD extraction easily. I discussed with EP who agrees.   He has improved with IV hydration and ID is managing his abx. Would continue to hold diuretics. Pull PICC today. After 48 hours will draw surveillance cx. If remain negative will repalce PICC line and treat for 6 weeks with iv abx followed but oral suppressive therapy lifelong.   I have discussed TEE with primary team and agree that it is unlikely to change manage much except if there was a large vegetation on the lead then we might need to reconsider risk/benefits of lead extraction. [facility] team will d/w family.   Will see how much he improves with treatment of bacteremia prior to considering full hospice consult and deactivation of ICD.   Length of Stay: 2  Arvilla Meres  MD 01/18/2016, 10:29 AM  Advanced Heart Failure Team Pager [phone] (M-F; 7a - 4p)  Please contact [facility] for night-coverage after hours (4p -7a ) and weekends on [email or web address]

## 2016-01-18 NOTE — Progress Notes (Addendum)
Subjective: Currently, the patient is comfortable and feeling better than yesterday. No complaints other than HA. He is oriented to self and city. Eating some food and has increased PO fluid intake.  Interval Events: Stable. Some low BPs O/N tx w/ 500cc bolus.  Objective: Vital signs in last 24 hours: Vitals:   01/18/16 0900 01/18/16 1000 01/18/16 1100 01/18/16 1203  BP: 110/71 112/60 110/84 131/81  Pulse: 95 92 95   Resp: (!) (!) 27  Temp:    98 F (36.7 C)  TempSrc:    Axillary  SpO2: 97% 97% 96% 100%  Weight:      Height:       08/01 0701 - 08/02 0700 In: 2994 [P.O.:580; I.V.:1914; IV Piggyback:500] Out: 750 [Urine:750]  Physical Exam: Physical Exam  Constitutional: No distress.  Cardiovascular: Normal rate, regular rhythm and normal heart sounds.   Pulmonary/Chest: Effort normal and breath sounds normal.  Abdominal: Soft. Bowel sounds are normal. There is no tenderness.  Musculoskeletal: He exhibits no edema.  Neurological: He is disoriented (oriented to self and hospital only).  Psychiatric: Cognition and memory are impaired.   Labs: CBC:  Recent Labs Lab 01/16/16 1036 01/16/16 1124 01/17/16 0435 01/18/16 0425  WBC 15.1*  --  12.5* 7.2  HGB 17.1* 19.7* 13.6 12.6*  HCT 52.2* 58.0* 43.1 40.7  MCV 81.4  --  81.0 81.9  PLT 181  --  163 144*   Metabolic Panel:  Recent Labs Lab 01/16/16 1036  01/16/16 1718 01/16/16 1744 01/16/16 2140 01/17/16 0645 01/18/16 0425  NA 135  < > 140 139 140 141 142  K 4.3  < > 3.1* 2.9* 3.3* 3.8 3.0*  CL 91*  < > 98* 98* 101 104 106  CO2 25  < > GLUCOSE 528*  < > 303* 246* 85 143* 115*  BUN 105*  < > 102* 102* 100* 92* 64*  CREATININE 3.67*  < > 3.50* 3.50* 3.11* 2.73* 1.77*  CALCIUM 9.8  < > 9.1 9.2 8.7* 8.7* 8.2*  ALT 20  --   --   --   --   --   --   ALKPHOS 169*  --   --   --   --   --   --   BILITOT 5.2*  --   --   --   --   --   --   PROT 8.4*  --   --   --   --   --   --   ALBUMIN  3.7  --   --   --   --   --   --   LIPASE 16  --   --   --   --   --   --   LABPROT 17.1*  --   --   --   --   --   --   INR 1.38  --   --   --   --   --   --   < > = values in this interval not displayed. BG:  Recent Labs Lab 01/17/16 1602 01/17/16 1924 01/17/16 2150 01/18/16 0807 01/18/16 1203  GLUCAP 229* 96 83 109* 192*    Lab Results  Component Value Date   HGBA1C 8.7 07/20/2015   Microbiology: BCx 1/2 growing MSSA, repeat BCx pending.  Imaging: CXR wnl.   Medications: Infusions: . sodium chloride 75 mL/hr at 01/17/16 0550  .  sodium chloride    . milrinone 0.25 mcg/kg/min (01/17/16 1016)    Scheduled Medications: . heparin  5,000 Units Subcutaneous Q8H  . insulin aspart  0-15 Units Subcutaneous TID WC  . insulin aspart  0-5 Units Subcutaneous QHS  . insulin glargine  5 Units Subcutaneous QHS  . lidocaine  5 mL Intradermal Once  . nafcillin IV  2 g Intravenous Q4H   Assessment/Plan: Pt is a 70 y.o. yo male with a PMHx of CHF who was admitted on 01/16/2016 with symptoms of oliguria, hyperglycemia, and AMS which was determined to be secondary to HHS, dehydration, and bacteremia. Interventions at this time will be focused on treating his bacteremia, gentle rehydration, glycemic control.   Prerenal Azotemia / Uremia: Electrolytes resolved. Pt comfortable w/o complaints, tolerating small amounts of regular diet. Still required bolus O/N w/ low BPs and CVP of 4-5. Continued IVF today w/ improved oral fluid intake, but still poor solid intake. - Reg diet, Nutrition c/s for recs on supplements - Repeat BMP in AM to follow SCr, K - d/c IVF, consider 500cc bolus for soft BPs - transfer to telemetry from stepdown  Bacteremia: BCx 1/2 + for MSSA. Leukocytosis from 15.1 now resolved. Tunneled PICC to be removed by IR. Not candidate for ICD removal per HF/EP team, will discuss potential for TEE w/ family to eval for vegetations. ID, EP, HF teams following. Goals of Care  discussion with family. - Second set of BCx x2 pending - Trend CBC in AM - Per ID: tx w/ 6 wks Nafcillin IV, then lifelong oral suppression Abx if ICD remains in place - Discuss possible TEE w/ family  Paronichia of right middle finger: I&D, treated w/ warm soaks. ID notes could be source for bacteremia. Continue to monitor. On IV Abx for bacteremia.  CHF: LV EF 20-25% w/ ICD. Currently on milrinone through PIV. Now bacteremic, GoC w/ family needed to assess whether ICD removal should be attempted vs lifelong Abx therapy. - Cont milrinone infusion - Watch fluid status - Possible TEE  DMII: Glucoses under better control. Now on FULL diet with palliation as priority. Restarted insulins today. - Lantus 5U qHS - mSSI qAC/qHS  HTN: Holding home BP meds. BP low currently. IVF bolus as needed.  DVT PPx: - Heparin SQ 5,000 U  Length of Stay: 2 day(s) Dispo: Anticipated discharge when clinically stable. Will need long term IV access for 6 wks Abx therapy.  Carolynn Comment, MD  Pager: [phone] (7AM-5PM) 01/18/2016, 2:41 PM

## 2016-01-18 NOTE — Progress Notes (Signed)
  Date: 01/18/2016  Patient name: Gary Gallegos  Medical record number: [id number]  Date of birth: 08-01-1945   This patient has been seen and the plan of care was discussed with the house staff. Please see their note for complete details. I concur with their findings with the following additions/corrections: Team discussed Mr Fiebelkorn with Dr B this AM. Dr Marinda Elk will discuss TEE with daughter for final decision. I thought PICC had been removed but I was wrong - remove PICC. Will need repeat blood cxs afterwards. Cr is trending down nicely. Will stop IVF as pt seems euvolemic and is taking PO, and has very low EF. Cont IV milrinone infusion.  Burns Spain, MD 01/18/2016, 3:28 PM

## 2016-01-19 DIAGNOSIS — T80219D Unspecified infection due to central venous catheter, subsequent encounter: Secondary | ICD-10-CM

## 2016-01-19 LAB — BASIC METABOLIC PANEL
ANION GAP: 9 (ref 5–15)
BUN: 40 mg/dL — ABNORMAL HIGH (ref 6–20)
CALCIUM: 8.5 mg/dL — AB (ref 8.9–10.3)
CO2: 26 mmol/L (ref 22–32)
CREATININE: 1.34 mg/dL — AB (ref 0.61–1.24)
Chloride: 108 mmol/L (ref 101–111)
GFR calc Af Amer: >60 mL/min (ref >60)
GFR, EST NON AFRICAN AMERICAN: 53 mL/min — AB (ref >60)
GLUCOSE: 82 mg/dL (ref 65–99)
Potassium: 3.6 mmol/L (ref 3.5–5.1)
Sodium: 143 mmol/L (ref 135–145)

## 2016-01-19 LAB — CBC
HCT: 42.6 % (ref 39.0–52.0)
Hemoglobin: 13.1 g/dL (ref 13.0–17.0)
MCH: 25.1 pg — ABNORMAL LOW (ref 26.0–34.0)
MCHC: 30.8 g/dL (ref 30.0–36.0)
MCV: 81.8 fL (ref 78.0–100.0)
Platelets: 148 10*3/uL — ABNORMAL LOW (ref 150–400)
RBC: 5.21 MIL/uL (ref 4.22–5.81)
RDW: 19 % — ABNORMAL HIGH (ref 11.5–15.5)
WBC: 6.8 10*3/uL (ref 4.0–10.5)

## 2016-01-19 LAB — CULTURE, BLOOD (ROUTINE X 2)

## 2016-01-19 LAB — GLUCOSE, CAPILLARY
GLUCOSE-CAPILLARY: 180 mg/dL — AB (ref 65–99)
GLUCOSE-CAPILLARY: 166 mg/dL — AB (ref 65–99)
GLUCOSE-CAPILLARY: 169 mg/dL — AB (ref 65–99)
Glucose-Capillary: 100 mg/dL — ABNORMAL HIGH (ref 65–99)

## 2016-01-19 MED ORDER — RIFAMPIN 300 MG PO CAPS
600.0000 mg | ORAL_CAPSULE | Freq: Every day | ORAL | Status: DC
  Administered 2016-01-19 – 2016-01-24 (×6): 600 mg via ORAL
  Filled 2016-01-19 (×7): qty 2

## 2016-01-19 MED ORDER — POTASSIUM CHLORIDE CRYS ER 20 MEQ PO TBCR
40.0000 meq | EXTENDED_RELEASE_TABLET | Freq: Once | ORAL | Status: DC
  Filled 2016-01-19: qty 2

## 2016-01-19 MED ORDER — POTASSIUM CHLORIDE 20 MEQ/15ML (10%) PO SOLN
40.0000 meq | Freq: Once | ORAL | Status: AC
  Administered 2016-01-19: 40 meq via ORAL
  Filled 2016-01-19: qty 30

## 2016-01-19 MED ORDER — ENSURE ENLIVE PO LIQD
237.0000 mL | Freq: Three times a day (TID) | ORAL | Status: DC
  Administered 2016-01-19 – 2016-01-23 (×6): 237 mL via ORAL

## 2016-01-19 NOTE — Care Management Note (Addendum)
Case Management Note  Patient Details  Name: Gary Gallegos MRN: [id number] Date of Birth: April 09, 1946 Nausea/Vomiting Subjective/Objective:  Pt presented for Nausea/Vomiting, increased WBC. Pt is on Milrinone- End Stage Heart Failure. Pt was previously active with [facility] for home Milrinone and agencies were changed. Per pt he lives in [place]. CM did try to contact the daughter- mailbox was full. Hopefully daughter will call back to get more information.             Action/Plan: PT/OT consulted and recommendations for SNF. CM will continue to monitor for additional needs.   Expected Discharge Date:                  Expected Discharge Plan:  Home w Home Health Services  In-House Referral:   N/A  Discharge planning Services  CM Consult  Post Acute Care Choice:   Hospice Choice offered to:   Patient/Daughter  DME Arranged:   N/A DME Agency:   N/A  HH Arranged:    RN HH Agency:   [facility]  Status of Service: Completed.  If discussed at Long Length of Stay Meetings, dates discussed:    Additional Comments:  [address] Tomi Bamberger, RN,BSN [phone]  CM did speak with [facility] Liaison and they can accept patient for Hospice Services. Pt is awaiting [facility] via IR before d/c. [facility] to provide IV Milrinone and pump along with IC Naficillin. Plan will be to d/c home with Hospice Services. No further needs from CM at this time.    1205 01-24-16 Tomi Bamberger, RN,BSN [phone] CM did speak with MD Strelow in ref to disposition needs. Per MD plan will be to go home via [facility]. CM did place call back to [facility] in regards to [facility]. Liaison Adela Lank to call CM back in regards to if can accept the patient. CM will continue to monitor.  1522 01-23-16 Tomi Bamberger, RN,BSN [phone] CM did speak with daughter [name]. Plan will be to return home once stable. CM did receive referral for Home with Hospice Services. Per daughter pt  lives in [place] with her. Pt was using [facility] for Milrinone gtt. CM did speak with pt and daughter. Plans were for Hospice Services- daughter agreed to speak with [facility]. CM needs to find out if pt will need Hospice vs returning home with [facility]. No further needs at this time.   Gala Lewandowsky, RN 01/19/2016, 2:47 PM

## 2016-01-19 NOTE — Care Management Important Message (Signed)
Important Message  Patient Details  Name: Gary Gallegos MRN: [id number] Date of Birth: [birth year]   Medicare Important Message Given:  Yes    [name] 01/19/2016, 10:13 AM

## 2016-01-19 NOTE — Progress Notes (Signed)
Subjective: Currently, the pt has no complaints. He was able to ambulate some w/ assistance yesterday and has had good BM. Still only patially oriented, but reports good appetite.  Interval Events: Transferred to floor.  Objective: Vital signs in last 24 hours: Vitals:   01/18/16 2344 01/19/16 0150 01/19/16 0435 01/19/16 0439  BP: (!) 113/91 108/79  109/87  Pulse: (!) 115 (!) 106  (!) 125  Resp: (!) 26   (!) 24  Temp: 98.1 F (36.7 C)   98.3 F (36.8 C)  TempSrc: Axillary   Oral  SpO2: 94%   98%  Weight:   128 lb 11.2 oz (58.4 kg)   Height:       Wt: +14 lbs since admission I/O: 08/02 0701 - 08/03 0700 In: 2800 [P.O.:1550; I.V.:750; IV Piggyback:500] Out: 1275 [Urine:1275]  Physical Exam: Physical Exam  Constitutional: No distress.  Cardiovascular: Normal rate, regular rhythm and normal heart sounds.   Pulmonary/Chest: Effort normal and breath sounds normal.  Abdominal: Soft. Bowel sounds are normal. There is no tenderness.  Musculoskeletal: He exhibits no edema.  Neurological: He is disoriented (oriented to self and hospital only).  Psychiatric: Cognition and memory are impaired.   Labs: CBC:  Recent Labs Lab 01/16/16 1036 01/16/16 1124 01/17/16 0435 01/18/16 0425 01/19/16 0328  WBC 15.1*  --  12.5* 7.2 6.8  HGB 17.1* 19.7* 13.6 12.6* 13.1  HCT 52.2* 58.0* 43.1 40.7 42.6  MCV 81.4  --  81.0 81.9 81.8  PLT 181  --  163 144* 148*   Metabolic Panel:  Recent Labs Lab 01/16/16 1036  01/16/16 1744 01/16/16 2140 01/17/16 0645 01/18/16 0425 01/19/16 0328  NA 135  < > 139 140 141 142 143  K 4.3  < > 2.9* 3.3* 3.8 3.0* 3.6  CL 91*  < > 98* 101 104 106 108  CO2 25  < > GLUCOSE 528*  < > 246* 85 143* 115* 82  BUN 105*  < > 102* 100* 92* 64* 40*  CREATININE 3.67*  < > 3.50* 3.11* 2.73* 1.77* 1.34*  CALCIUM 9.8  < > 9.2 8.7* 8.7* 8.2* 8.5*  ALT 20  --   --   --   --   --   --   ALKPHOS 169*  --   --   --   --   --   --   BILITOT 5.2*  --    --   --   --   --   --   PROT 8.4*  --   --   --   --   --   --   ALBUMIN 3.7  --   --   --   --   --   --   LIPASE 16  --   --   --   --   --   --   LABPROT 17.1*  --   --   --   --   --   --   INR 1.38  --   --   --   --   --   --   < > = values in this interval not displayed. BG:  Recent Labs Lab 01/17/16 2150 01/18/16 0807 01/18/16 1203 01/18/16 1717 01/18/16 2051  GLUCAP 83 109* 192* 159* 156*    Lab Results  Component Value Date   HGBA1C 8.7 07/20/2015   Microbiology: BCx 1/2 growing MSSA, repeat BCx pending.  Imaging:  CXR wnl.   Medications: Infusions: . milrinone 0.25 mcg/kg/min (01/18/16 1400)    Scheduled Medications: . heparin  5,000 Units Subcutaneous Q8H  . insulin aspart  0-15 Units Subcutaneous TID WC  . insulin aspart  0-5 Units Subcutaneous QHS  . insulin glargine  5 Units Subcutaneous QHS  . lidocaine  5 mL Intradermal Once  . nafcillin IV  2 g Intravenous Q4H   Assessment/Plan: Pt is a 70 y.o. yo male with a PMHx of CHF who was admitted on 01/16/2016 with symptoms of oliguria, hyperglycemia, and AMS which was determined to be secondary to HHS, dehydration, and bacteremia. Interventions at this time will be focused on treating his bacteremia, gentle rehydration, glycemic control.   Prerenal Azotemia / Uremia: Electrolytes resolved. Pt comfortable w/o complaints, tolerating small amounts of regular diet. PO intake improved. - Reg diet, Nutrition c/s for recs on supplements - Repeat BMP in AM to follow SCr, K - d/c IVF, consider 500cc bolus for soft BPs - transfer to telemetry from stepdown  Bacteremia: BCx 1/2 + for MSSA. Leukocytosis from 15.1 now resolved. Tunneled PICC to be removed by IR. Not candidate for ICD removal per HF/EP team, will discuss potential for TEE w/ family to eval for vegetations. ID, EP, HF teams following. Goals of Care discussion with family was positive yesterday, leaning toward non-aggressive management w/o removal of  ICD. - Second set of BCx x2 pending, will repeat in 48hrs post PICC removal - Trend CBC in AM - Per ID: ADD rifampin today + 6 wks Nafcillin IV, then lifelong oral suppression Abx if ICD remains in place - Defer TEE for now per family  Paronichia of right middle finger: s/p I&D, treated w/ warm soaks. ID notes could be source for bacteremia, wound cx +MSSA. Continue to monitor. On IV Abx for bacteremia.  CHF: LV EF 20-25% w/ ICD. Currently on milrinone through PIV. Now bacteremic. Will encourage continued GoC discussion once outpt as pts HF has continued to deteriorate, now with possibility of uncurable bacteremia. - Cont milrinone infusion - Watch fluid status - Defer TEE  DMII: Glucoses under better control. Now on FULL diet with palliation as priority. Restarted insulins today. Receiving nafcillin w/ dextrose. - Lantus 5U qHS - mSSI qAC/qHS  HTN: Holding home BP meds. BP low currently. IVF bolus as needed.  DVT PPx: - Heparin SQ 5,000 U  Length of Stay: 3 day(s) Dispo: Anticipated discharge when clinically stable, needs 48hrs post-PICC BCx. If clear, will replace long-term access for IV Abx and can DC to SNF.  Carolynn Comment, MD  Pager: [phone] (7AM-5PM) 01/19/2016, 6:58 AM

## 2016-01-19 NOTE — Evaluation (Signed)
Physical Therapy Evaluation Patient Details Name: Gary Gallegos MRN: [id number] DOB: 1946/02/03 Today's Date: 01/19/2016   History of Present Illness  Admitted 01/16/16 with N/V, decreased appetite, Acute renal failure, and hyperglycemic nonketotic state. With Elevated WBCs and hypotension concerns for sepsis    Clinical Impression  Pt admitted with above diagnosis. Pt currently with functional limitations due to the deficits listed below (see PT Problem List). Pt currently required mod to max assist for functional mobility with limited standing tolerance due to weakness as well  increasing HR (reaching 141 bpm).  Pt will benefit from skilled PT to increase their independence and safety with mobility to allow discharge to the venue listed below.       Follow Up Recommendations SNF;Supervision/Assistance - 24 hour    Equipment Recommendations  Wheelchair cushion (measurements PT);Wheelchair (measurements PT);3in1 (PT)    Recommendations for Other Services       Precautions / Restrictions Precautions Precautions: Fall;ICD/Pacemaker Restrictions Weight Bearing Restrictions: No      Mobility  Bed Mobility Overal bed mobility: Needs Assistance Bed Mobility: Rolling;Sidelying to Sit;Sit to Sidelying Rolling: Min assist Sidelying to sit: Mod assist     Sit to sidelying: Min assist General bed mobility comments: verbal cues for sequencing, hand placement, and technique. pt using rails for support  Transfers Overall transfer level: Needs assistance Equipment used: Rolling walker (2 wheeled) Transfers: Sit to/from Stand Sit to Stand: Max assist         General transfer comment: verbal and tactile cues for hand placement and facilitation for anterior weightshift. Pt maintaining heavy posterior lean despite up to max assist provided to correct. Pt's HR increased to 141 bpm so deferred further standing activities at this time.   Ambulation/Gait Ambulation/Gait assistance:   (unsafe to assess at this time due to increasing HR)              Stairs            Wheelchair Mobility    Modified Rankin (Stroke Patients Only)       Balance Overall balance assessment: Needs assistance Sitting-balance support: Bilateral upper extremity supported;Feet supported;Feet unsupported Sitting balance-Leahy Scale: Poor Sitting balance - Comments: required use of at least 1 UE for support to maintain sitting EOB Postural control: Posterior lean Standing balance support: Bilateral upper extremity supported Standing balance-Leahy Scale: Zero                               Pertinent Vitals/Pain Pain Assessment: No/denies pain    Home Living Family/patient expects to be discharged to:: Private residence Living Arrangements: Children Available Help at Discharge: Family;Available 24 hours/day Type of Home: House Home Access: Ramped entrance     Home Layout: One level Home Equipment: Walker - 2 wheels Additional Comments: Once they moved to [facility], daughter reports his health declined and needed assist with everything including mobility. Prior to the functional decline, pt able to be independent including negotiating stairs    Prior Function Level of Independence: Needs assistance   Gait / Transfers Assistance Needed: required assist wtih RW            Hand Dominance        Extremity/Trunk Assessment   Upper Extremity Assessment: Generalized weakness           Lower Extremity Assessment: Generalized weakness      Cervical / Trunk Assessment: Kyphotic  Communication   Communication: No difficulties  Cognition  Arousal/Alertness: Lethargic;Awake/alert (keeps eyes closed but easily arousable) Behavior During Therapy: WFL for tasks assessed/performed Overall Cognitive Status: Impaired/Different from baseline Area of Impairment: Orientation;Following commands;Safety/judgement;Awareness;Memory Orientation Level:  Person;Place   Memory: Decreased short-term memory Following Commands: Follows one step commands consistently;Follows multi-step commands inconsistently Safety/Judgement: Decreased awareness of safety;Decreased awareness of deficits     General Comments: daughter reports change in personality/behavior    General Comments General comments (skin integrity, edema, etc.): HR increasing to 141 bpm during functional mobility so deferred further mobility.  Unlikley pt would have been agreeable even if HR had not increased this high    Exercises        Assessment/Plan    PT Assessment Patient needs continued PT services  PT Diagnosis Difficulty walking;Generalized weakness;Acute pain;Altered mental status   PT Problem List Decreased strength;Decreased range of motion;Decreased activity tolerance;Decreased balance;Decreased mobility;Decreased coordination;Decreased cognition;Decreased knowledge of use of DME;Decreased safety awareness;Cardiopulmonary status limiting activity;Decreased skin integrity;Pain  PT Treatment Interventions DME instruction;Gait training;Functional mobility training;Stair training;Therapeutic activities;Therapeutic exercise;Balance training;Neuromuscular re-education;Cognitive remediation;Patient/family education;Wheelchair mobility training   PT Goals (Current goals can be found in the Care Plan section) Acute Rehab PT Goals Patient Stated Goal: none stated PT Goal Formulation: With patient/family Time For Goal Achievement: 02/02/16 Potential to Achieve Goals: Fair    Frequency Min 2X/week   Barriers to discharge Inaccessible home environment;Other (comment) chart indicates possible hospice may be needed    Co-evaluation               End of Session Equipment Utilized During Treatment: Gait belt Activity Tolerance: Treatment limited secondary to medical complications (Comment) (increased HR) Patient left: in bed;with call bell/phone within reach;with  family/visitor present Nurse Communication: Mobility status;Other (comment) (increasing HR)         Time: 1340-1405 PT Time Calculation (min) (ACUTE ONLY): 25 min   Charges:   PT Evaluation $PT Eval High Complexity: 1 Procedure     PT G Codes:        Delorise Royals, PT, DPT  01/19/2016, 2:19 PM

## 2016-01-19 NOTE — Plan of Care (Signed)
Problem: Activity: Goal: Risk for activity intolerance will decrease Outcome: Progressing Patient able to stand and pivot to bedside commode with maximum assist of two staff members.  Patient able to pull self up to stand on standing scale and hold on to scale to obtain morning weight also with two staff assistance.  Sinus tachycardia in the 120's to 130 during this activity, no shortness of breath noted.  Heart rate 100-115 while resting in bed.

## 2016-01-19 NOTE — Plan of Care (Signed)
Problem: Bowel/Gastric: Goal: Will not experience complications related to bowel motility Outcome: Progressing Patient successful with having large bowel movement this morning.

## 2016-01-19 NOTE — Progress Notes (Signed)
Patient ID: Gary Gallegos, male   DOB: June 13, 1946, 70 y.o.   MRN: [id number]          [facility]ious Disease    Date of Admission:  01/16/2016   Total days of antibiotics 4        Day 3 nafcillin  [name] remains afebrile. One of 2 admission blood cultures and cultures of his paronychia have grown MSSA. Repeat blood cultures are negative at 48 hours. His central line was removed yesterday. There was no evidence of endocarditis or ICD lead infection by transthoracic echocardiogram. I will continue nafcillin and add rifampin since we are trying to cure his infection and not need to remove the ICD.    [name], MD [facility] for Infectious Disease [facility] [phone] pager   [phone] cell 06/21/2015, 1:32 PM

## 2016-01-19 NOTE — Progress Notes (Signed)
Pt could not swallow K. Pharmacy switched to liquid

## 2016-01-19 NOTE — Progress Notes (Signed)
Advanced Heart Failure Rounding Note  Referring Provider: Dr. Lynelle Doctor Primary Physician:  Pincus Badder, MD Primary Cardiologist:  Dr. [name]   Reason for Admission: A/C systolic HF  Subjective:    Admitted 01/16/16 with N/V, decreased appetite, Acute renal failure, and hyperglycemic nonketotic state. With Elevated WBCs and hypotension concerns for sepsis.  Pan cultured.  Blood cultures + for staph aureus.  Lactic acid 01/16/16 4.9.   WBC 15.1 -> 6.8. Now on rifampin  Afebrile. No complaints this am.  Remains confused > than his baseline.  Creatinine continues to trend down.  Not taking his potassium pills.   Objective:   Weight Range: 128 lb 11.2 oz (58.4 kg) Body mass index is 19.57 kg/m.   Vital Signs:   Temp:  [97.4 F (36.3 C)-99.3 F (37.4 C)] 99.3 F (37.4 C) (08/03 0721) Pulse Rate:  [100-125] 125 (08/03 0439) Resp:  [14-27] 24 (08/03 0721) BP: (98-131)/(72-91) 112/80 (08/03 0721) SpO2:  [94 %-100 %] 98 % (08/03 0439) Weight:  [128 lb 11.2 oz (58.4 kg)] 128 lb 11.2 oz (58.4 kg) (08/03 0435) Last BM Date: 01/19/16  Weight change: Filed Weights   01/16/16 1314 01/18/16 0424 01/19/16 0435  Weight: 112 lb (50.8 kg) 128 lb 1.4 oz (58.1 kg) 128 lb 11.2 oz (58.4 kg)    Intake/Output:   Intake/Output Summary (Last 24 hours) at 01/19/16 1113 Last data filed at 01/19/16 0610  Gross per 24 hour  Intake             2000 ml  Output             1275 ml  Net              725 ml     Physical Exam: General:  Chronically ill and elderly appearing. HEENT: normal Neck: supple. JVD does not appear elevated.. Carotids 2+ bilat; no bruits. No thyromegaly or nodule noted. Cor: PMI laterally displaced. Regular. No rubs. + S3; +3/6 systolic murmur PICC removed. Site C/D/I Lungs: Clear Abdomen: soft, NT, ND, no HSM. No bruits or masses. +BS  Extremities: no cyanosis, clubbing, rash, edema.  Left 3rd finger with dressing Neuro: alert & oriented x 3, cranial nerves grossly  intact. moves all 4 extremities w/o difficulty. Affect pleasant.  Telemetry: Reviewed personally, NSR/ST 100-110s  Labs: CBC  Recent Labs  01/18/16 0425 01/19/16 0328  WBC 7.2 6.8  HGB 12.6* 13.1  HCT 40.7 42.6  MCV 81.9 81.8  PLT 144* 148*   Basic Metabolic Panel  Recent Labs  01/18/16 0425 01/19/16 0328  NA 142 143  K 3.0* 3.6  CL 106 108  CO2 27 26  GLUCOSE 115* 82  BUN 64* 40*  CREATININE 1.77* 1.34*  CALCIUM 8.2* 8.5*   Liver Function Tests No results for input(s): AST, ALT, ALKPHOS, BILITOT, PROT, ALBUMIN in the last 72 hours. No results for input(s): LIPASE, AMYLASE in the last 72 hours. Cardiac Enzymes  Recent Labs  01/17/16 0434 01/17/16 1630 01/17/16 2005  TROPONINI 0.13* 0.08* 0.08*    BNP: BNP (last 3 results)  Recent Labs  01/16/16 1036  BNP 626.2*    ProBNP (last 3 results) No results for input(s): PROBNP in the last 8760 hours.   D-Dimer No results for input(s): DDIMER in the last 72 hours. Hemoglobin A1C No results for input(s): HGBA1C in the last 72 hours. Fasting Lipid Panel No results for input(s): CHOL, HDL, LDLCALC, TRIG, CHOLHDL, LDLDIRECT in the last 72 hours. Thyroid  Function Tests No results for input(s): TSH, T4TOTAL, T3FREE, THYROIDAB in the last 72 hours.  Invalid input(s): FREET3  Other results:     Imaging/Studies:  Ir Removal Tun Cv Cath W/o Fl  Result Date: 01/18/2016 INDICATION: Bacteremia, request made for removal of tunneled IJ PICC that is in place for over a year and a half. The central venous catheter was placed at an outside institution. EXAM: REMOVAL OF TUNNELED PICC line MEDICATIONS: None COMPLICATIONS: None immediate. PROCEDURE: Informed verbal consent was obtained from the patient following an explanation of the procedure, risks, benefits and alternatives to treatment. Sterile technique was utilized including mask, sterile gloves, sterile drape, hand hygiene, and ChloraPrep. Utilizing a  combination of blunt dissection and gentle traction, the catheter was removed. The cuff remained scarred into the patient and was not removed. Hemostasis was obtained with manual compression. A dressing was placed. The patient tolerated the procedure well without immediate post procedural complication. IMPRESSION: Successful removal of tunneled PICC line. Read by: Barnetta Chapel, PA-C Electronically Signed   By: Simonne Come M.D.   On: 01/18/2016 16:38    Latest Echo  Latest Cath   Medications:     Scheduled Medications: . heparin  5,000 Units Subcutaneous Q8H  . insulin aspart  0-15 Units Subcutaneous TID WC  . insulin aspart  0-5 Units Subcutaneous QHS  . insulin glargine  5 Units Subcutaneous QHS  . lidocaine  5 mL Intradermal Once  . nafcillin IV  2 g Intravenous Q4H  . potassium chloride  40 mEq Oral Once  . potassium chloride  40 mEq Oral Once  . rifampin  600 mg Oral Daily    Infusions: . milrinone 0.25 mcg/kg/min (01/18/16 1400)    PRN Medications: acetaminophen, ondansetron **OR** ondansetron (ZOFRAN) IV   Assessment   1. Bacteremia - BCx + with Staph Aureus 2. Chronic systolic CHF 3. Hyperglycemic nonketotic state 4. ARF on CKD stage III 5. Paronychia; left middle finger  Plan    Renal function continues to improve with IV hydration and ABX.  Diuretics on hold.   PICC line out 01/18/16.  Will need repeat cultures drawn 48 hrs after removal.  If remain negative ill replace PICC line and treat for 6 weeks with IV ABX follwed with oral suppressive therapy for life.  ICD extraction discussed but consensus is he would not tolerate procedure with end stage HF.   Will defer TEE for now.   Will see how much he improves with treatment of bacteremia prior to considering full hospice consult and deactivation of ICD.   Length of Stay: 3  Luane School 01/19/2016, 11:13 AM  Advanced Heart Failure Team Pager [phone] (M-F; 7a - 4p)  Please contact [facility] for night-coverage after hours (4p -7a ) and weekends on [email or web address]  Patient seen and examined with Otilio Saber, PA-C. We discussed all aspects of the encounter. I agree with the assessment and plan as stated above.   Remains very weak and confused. PICC line now out. Renal function improved with holding diuretics. Getting milrinone via PIV. Will continue to follow. Agree with deferring TEE. If he doesn't improve significantly may be getting to point where Hospice is best situation for him.  Tejasvi Brissett,MD 11:50 PM

## 2016-01-19 NOTE — Progress Notes (Signed)
Nutrition Follow-up / Consult  DOCUMENTATION CODES:   Severe malnutrition in context of chronic illness, Underweight  INTERVENTION:    Ensure Enlive po TID, each supplement provides 350 kcal and 20 grams of protein  NUTRITION DIAGNOSIS:   Malnutrition related to chronic illness as evidenced by estimated needs.  Ongoing  GOAL:   Patient will meet greater than or equal to 90% of their needs  Unmet  MONITOR:   PO intake, Supplement acceptance, I & O's, Labs, Weight trends  ASSESSMENT:   70 yo Male with PMHx of type II DM, systolic CHF with ICD placement and HTN that presents to the ED due to physical decline as noted per daughter's history.  Patient was unable to state his location, year or month.  Most of history was obtained per ED notes and daughter from phone discussion.  Gary Gallegos has had a decline in the past 2 weeks.  Prior to the decline patient was able to walk up several flights of stairs and attend to his daily needs.   Daughter thought patient was fluid overloaded and began to fluid restrict.  The patient has not been able to produce urine in the past 2 days and is now not able to walk or do his regular activities of daily living.  Due to a recent move he has not been taking his medications regularly in the past 1-2weeks.   Patient does endorse nausea and vomiting along with abdominal pain.  Diet has been advanced to heart healthy with chopped meats. Patient with improved appetite and PO intake, but still only consuming 15-20% of meals. Family thinks he is eating better than he was PTA. He likes chocolate and strawberry Ensure, and agreed to drink it between meals.   Diet Order:  Diet Heart Room service appropriate? Yes; Fluid consistency: Thin  Skin:  Reviewed, no issues  Last BM:  7/27  Height:   Ht Readings from Last 1 Encounters:  01/16/16 5\' 8"  (1.727 m)    Weight:   Wt Readings from Last 1 Encounters:  01/19/16 128 lb 11.2 oz (58.4 kg)    Ideal Body  Weight:  70 kg  BMI:  Body mass index is 19.57 kg/m.  Estimated Nutritional Needs:   Kcal:  1700-1900  Protein:  80-90 gm  Fluid:  1.7-1.9 L  EDUCATION NEEDS:   No education needs identified at this time  Joaquin Courts, RD, LDN, CNSC Pager [phone] After Hours Pager [phone]

## 2016-01-19 NOTE — Progress Notes (Signed)
  Date: 01/19/2016  Patient name: Gary Gallegos  Medical record number: [id number]  Date of birth: 01/30/1946   This patient has been seen and the plan of care was discussed with the house staff. Please see their note for complete details. I concur with their findings with the following additions/corrections: [name] wants to go home. Also asked for cold water, then juice, then a soda. When he was admitted, his diuretics were held as he was volume depleted. He received IV hydration and now +5.3 L up and 14 lbs up. Weight in Feb 130, in June 143, weight today 128. His lungs remain clear and no LE edema. I doubt he has vol overload although will need monitored closely. Plan for now - no TEE, follow cxs, once cx clear 48 hrs, put in PICC, ABX 6 weeks IV, then oral suppressive ABX. Deactivation of ICD and stopping milrinone will be deferred to HF team as outpt,.   Burns Spain, MD 01/19/2016, 2:44 PM

## 2016-01-20 DIAGNOSIS — I5023 Acute on chronic systolic (congestive) heart failure: Secondary | ICD-10-CM

## 2016-01-20 DIAGNOSIS — L03011 Cellulitis of right finger: Secondary | ICD-10-CM

## 2016-01-20 DIAGNOSIS — I509 Heart failure, unspecified: Secondary | ICD-10-CM

## 2016-01-20 DIAGNOSIS — E119 Type 2 diabetes mellitus without complications: Secondary | ICD-10-CM

## 2016-01-20 DIAGNOSIS — Z794 Long term (current) use of insulin: Secondary | ICD-10-CM

## 2016-01-20 LAB — CBC WITH DIFFERENTIAL/PLATELET
BASOS PCT: 0 %
Basophils Absolute: 0 10*3/uL (ref 0.0–0.1)
EOS ABS: 0.1 10*3/uL (ref 0.0–0.7)
EOS PCT: 2 %
HCT: 39.3 % (ref 39.0–52.0)
Hemoglobin: 12.3 g/dL — ABNORMAL LOW (ref 13.0–17.0)
LYMPHS ABS: 0.8 10*3/uL (ref 0.7–4.0)
Lymphocytes Relative: 11 %
MCH: 25.6 pg — AB (ref 26.0–34.0)
MCHC: 31.3 g/dL (ref 30.0–36.0)
MCV: 81.7 fL (ref 78.0–100.0)
MONO ABS: 0.7 10*3/uL (ref 0.1–1.0)
MONOS PCT: 9 %
Neutro Abs: 5.4 10*3/uL (ref 1.7–7.7)
Neutrophils Relative %: 78 %
PLATELETS: 126 10*3/uL — AB (ref 150–400)
RBC: 4.81 MIL/uL (ref 4.22–5.81)
RDW: 19.5 % — AB (ref 11.5–15.5)
WBC: 6.9 10*3/uL (ref 4.0–10.5)

## 2016-01-20 LAB — BASIC METABOLIC PANEL
ANION GAP: 7 (ref 5–15)
BUN: 26 mg/dL — AB (ref 6–20)
CALCIUM: 8.1 mg/dL — AB (ref 8.9–10.3)
CO2: 23 mmol/L (ref 22–32)
Chloride: 109 mmol/L (ref 101–111)
Creatinine, Ser: 1.19 mg/dL (ref 0.61–1.24)
GFR calc Af Amer: >60 mL/min (ref >60)
GFR calc non Af Amer: >60 mL/min (ref >60)
GLUCOSE: 194 mg/dL — AB (ref 65–99)
POTASSIUM: 3.7 mmol/L (ref 3.5–5.1)
Sodium: 139 mmol/L (ref 135–145)

## 2016-01-20 LAB — GLUCOSE, CAPILLARY
GLUCOSE-CAPILLARY: 108 mg/dL — AB (ref 65–99)
Glucose-Capillary: 135 mg/dL — ABNORMAL HIGH (ref 65–99)
Glucose-Capillary: 148 mg/dL — ABNORMAL HIGH (ref 65–99)
Glucose-Capillary: 192 mg/dL — ABNORMAL HIGH (ref 65–99)

## 2016-01-20 MED ORDER — POTASSIUM CHLORIDE 20 MEQ/15ML (10%) PO SOLN
40.0000 meq | Freq: Every day | ORAL | Status: DC
  Administered 2016-01-21 – 2016-01-23 (×3): 40 meq via ORAL
  Filled 2016-01-20 (×3): qty 30

## 2016-01-20 MED ORDER — TORSEMIDE 20 MG PO TABS: 60.0000 mg | ORAL_TABLET | Freq: Every day | ORAL | Status: DC

## 2016-01-20 MED ORDER — TORSEMIDE 20 MG PO TABS: 40.0000 mg | ORAL_TABLET | Freq: Every day | ORAL | Status: DC

## 2016-01-20 MED ORDER — TORSEMIDE 20 MG PO TABS
40.0000 mg | ORAL_TABLET | Freq: Every day | ORAL | Status: DC
  Administered 2016-01-21: 40 mg via ORAL
  Filled 2016-01-20: qty 2

## 2016-01-20 NOTE — Progress Notes (Signed)
Subjective: No new complaints. PICC out   Antibiotics:  Anti-infectives    Start     Dose/Rate Route Frequency Ordered Stop   01/19/16 1100  rifampin (RIFADIN) capsule 600 mg     600 mg Oral Daily 01/19/16 1000     01/17/16 1430  nafcillin 2 g in dextrose 5 % 100 mL IVPB     2 g 200 mL/hr over 30 Minutes Intravenous Every 4 hours 01/17/16 1258     01/17/16 0945  nafcillin 2 g in dextrose 5 % 100 mL IVPB     2 g 200 mL/hr over 30 Minutes Intravenous  Once 01/17/16 0943 01/17/16 1146   01/17/16 0900  nafcillin injection 2 g  Status:  Discontinued     2 g Intravenous Every 4 hours 01/17/16 0827 01/17/16 0912   01/16/16 1500  vancomycin (VANCOCIN) IVPB 750 mg/150 ml premix  Status:  Discontinued     750 mg 150 mL/hr over 60 Minutes Intravenous Every 48 hours 01/16/16 1348 01/17/16 0827   01/16/16 1400  piperacillin-tazobactam (ZOSYN) IVPB 2.25 g  Status:  Discontinued     2.25 g 100 mL/hr over 30 Minutes Intravenous Every 8 hours 01/16/16 1348 01/17/16 0827      Medications: Scheduled Meds: . feeding supplement (ENSURE ENLIVE)  237 mL Oral TID BM  . heparin  5,000 Units Subcutaneous Q8H  . insulin aspart  0-15 Units Subcutaneous TID WC  . insulin aspart  0-5 Units Subcutaneous QHS  . insulin glargine  5 Units Subcutaneous QHS  . lidocaine  5 mL Intradermal Once  . nafcillin IV  2 g Intravenous Q4H  . [START ON 01/21/2016] potassium chloride  40 mEq Oral Daily  . rifampin  600 mg Oral Daily  . [START ON 01/21/2016] torsemide  40 mg Oral Daily   Continuous Infusions: . milrinone 0.25 mcg/kg/min (01/20/16 0851)   PRN Meds:.acetaminophen, ondansetron **OR** ondansetron (ZOFRAN) IV    Objective: Weight change: 3 lb 11.2 oz (1.678 kg)  Intake/Output Summary (Last 24 hours) at 01/20/16 1801 Last data filed at 01/20/16 1300  Gross per 24 hour  Intake              360 ml  Output             1650 ml  Net            -1290 ml   Blood pressure 113/75, pulse 68,  temperature 98.8 F (37.1 C), temperature source Oral, resp. rate (!) 23, height 5' 8"  (1.727 m), weight 132 lb 6.4 oz (60.1 kg), SpO2 96 %. Temp:  [98.2 F (36.8 C)-99.2 F (37.3 C)] 98.8 F (37.1 C) (08/04 1345) Pulse Rate:  [68-133] 68 (08/04 1345) Resp:  [22-23] 23 (08/04 0512) BP: (93-113)/(60-86) 113/75 (08/04 1735) SpO2:  [95 %-100 %] 96 % (08/04 1345) Weight:  [132 lb 6.4 oz (60.1 kg)] 132 lb 6.4 oz (60.1 kg) (08/04 5003)  Physical Exam: General: Alert and awake, oriented person and thinks he is in [place]: anicteric sclera,  EOMI, oropharynx clear and without exudate Cardiovascular: regular rate, normal r,  no murmur rubs or gallops heard with disposable stethoscope Pulmonary: clear to auscultation bilaterally, no wheezing, rales or rhonchi Gastrointestinal: soft nontender, nondistended, normal bowel sounds, Musculoskeletal: no  clubbing or edema noted bilaterally Skin,   Midline in place on chest, ICD without overt infection (though that means nothing with bacteremia)  His finger  01/17/16:  Left finger 01/18/16: sp I and  D by Dr. Jeffie Pollock     Neuro: nonfocal  CBC: CBC Latest Ref Rng & Units 01/20/2016 01/19/2016 01/18/2016  WBC 4.0 - 10.5 K/uL 6.9 6.8 7.2  Hemoglobin 13.0 - 17.0 g/dL 12.3(L) 13.1 12.6(L)  Hematocrit 39.0 - 52.0 % 39.3 42.6 40.7  Platelets 150 - 400 K/uL 126(L) 148(L) 144(L)      BMET  Recent Labs  01/19/16 0328 01/20/16 0342  NA 143 139  K 3.6 3.7  CL 108 109  CO2 26 23  GLUCOSE 82 194*  BUN 40* 26*  CREATININE 1.34* 1.19  CALCIUM 8.5* 8.1*     Liver Panel  No results for input(s): PROT, ALBUMIN, AST, ALT, ALKPHOS, BILITOT, BILIDIR, IBILI in the last 72 hours.     Sedimentation Rate No results for input(s): ESRSEDRATE in the last 72 hours. C-Reactive Protein No results for input(s): CRP in the last 72 hours.  Micro Results: Recent Results (from the past 720 hour(s))  Culture, blood (Routine X 2) w Reflex to  ID Panel     Status: Abnormal   Collection Time: 01/16/16  1:05 PM  Result Value Ref Range Status   Specimen Description BLOOD RIGHT FOREARM  Final   Special Requests BOTTLES DRAWN AEROBIC AND ANAEROBIC 5ML  Final   Culture  Setup Time   Final    GRAM POSITIVE COCCI IN CLUSTERS ANAEROBIC BOTTLE ONLY CRITICAL RESULT CALLED TO, READ BACK BY AND VERIFIED WITHAlona Bene PHARMD 2263 01/17/16 A BROWNING    Culture STAPHYLOCOCCUS AUREUS (A)  Final   Report Status 01/19/2016 FINAL  Final   Organism ID, Bacteria STAPHYLOCOCCUS AUREUS  Final      Susceptibility   Staphylococcus aureus - MIC*    CIPROFLOXACIN <=0.5 SENSITIVE Sensitive     ERYTHROMYCIN >=8 RESISTANT Resistant     GENTAMICIN <=0.5 SENSITIVE Sensitive     OXACILLIN 0.5 SENSITIVE Sensitive     TETRACYCLINE <=1 SENSITIVE Sensitive     VANCOMYCIN <=0.5 SENSITIVE Sensitive     TRIMETH/SULFA <=10 SENSITIVE Sensitive     CLINDAMYCIN <=0.25 RESISTANT Resistant     RIFAMPIN <=0.5 SENSITIVE Sensitive     Inducible Clindamycin POSITIVE Resistant     * STAPHYLOCOCCUS AUREUS  Blood Culture ID Panel (Reflexed)     Status: Abnormal   Collection Time: 01/16/16  1:05 PM  Result Value Ref Range Status   Enterococcus species NOT DETECTED NOT DETECTED Final   Vancomycin resistance NOT DETECTED NOT DETECTED Final   Listeria monocytogenes NOT DETECTED NOT DETECTED Final   Staphylococcus species DETECTED (A) NOT DETECTED Final    Comment: CRITICAL RESULT CALLED TO, READ BACK BY AND VERIFIED WITH: V BRYK PHARMD 0617 01/17/16 A BROWNING    Staphylococcus aureus DETECTED (A) NOT DETECTED Final    Comment: CRITICAL RESULT CALLED TO, READ BACK BY AND VERIFIED WITHAlona Bene PHARMD 0617 01/17/16 A BROWNING    Methicillin resistance NOT DETECTED NOT DETECTED Final   Streptococcus species NOT DETECTED NOT DETECTED Final   Streptococcus agalactiae NOT DETECTED NOT DETECTED Final   Streptococcus pneumoniae NOT DETECTED NOT DETECTED Final   Streptococcus  pyogenes NOT DETECTED NOT DETECTED Final   Acinetobacter baumannii NOT DETECTED NOT DETECTED Final   Enterobacteriaceae species NOT DETECTED NOT DETECTED Final   Enterobacter cloacae complex NOT DETECTED NOT DETECTED Final   Escherichia coli NOT DETECTED NOT DETECTED Final   Klebsiella oxytoca NOT DETECTED NOT DETECTED Final   Klebsiella pneumoniae NOT DETECTED NOT  DETECTED Final   Proteus species NOT DETECTED NOT DETECTED Final   Serratia marcescens NOT DETECTED NOT DETECTED Final   Carbapenem resistance NOT DETECTED NOT DETECTED Final   Haemophilus influenzae NOT DETECTED NOT DETECTED Final   Neisseria meningitidis NOT DETECTED NOT DETECTED Final   Pseudomonas aeruginosa NOT DETECTED NOT DETECTED Final   Candida albicans NOT DETECTED NOT DETECTED Final   Candida glabrata NOT DETECTED NOT DETECTED Final   Candida krusei NOT DETECTED NOT DETECTED Final   Candida parapsilosis NOT DETECTED NOT DETECTED Final   Candida tropicalis NOT DETECTED NOT DETECTED Final  Culture, blood (Routine X 2) w Reflex to ID Panel     Status: None (Preliminary result)   Collection Time: 01/16/16  1:22 PM  Result Value Ref Range Status   Specimen Description BLOOD LEFT ANTECUBITAL  Final   Special Requests BOTTLES DRAWN AEROBIC AND ANAEROBIC 5ML  Final   Culture NO GROWTH 4 DAYS  Final   Report Status PENDING  Incomplete  MRSA PCR Screening     Status: None   Collection Time: 01/16/16  6:22 PM  Result Value Ref Range Status   MRSA by PCR NEGATIVE NEGATIVE Final    Comment:        The GeneXpert MRSA Assay (FDA approved for NASAL specimens only), is one component of a comprehensive MRSA colonization surveillance program. It is not intended to diagnose MRSA infection nor to guide or monitor treatment for MRSA infections.   Culture, Urine     Status: Abnormal   Collection Time: 01/16/16  8:58 PM  Result Value Ref Range Status   Specimen Description URINE, RANDOM  Final   Special Requests NONE  Final     Culture MULTIPLE SPECIES PRESENT, SUGGEST RECOLLECTION (A)  Final   Report Status 01/18/2016 FINAL  Final  Culture, blood (Routine X 2) w Reflex to ID Panel     Status: None (Preliminary result)   Collection Time: 01/17/16 10:31 AM  Result Value Ref Range Status   Specimen Description BLOOD RIGHT HAND  Final   Special Requests IN PEDIATRIC BOTTLE  2CC  Final   Culture NO GROWTH 3 DAYS  Final   Report Status PENDING  Incomplete  Culture, blood (Routine X 2) w Reflex to ID Panel     Status: None (Preliminary result)   Collection Time: 01/17/16 10:32 AM  Result Value Ref Range Status   Specimen Description BLOOD RIGHT HAND  Final   Special Requests BOTTLES DRAWN AEROBIC AND ANAEROBIC  5CC  Final   Culture NO GROWTH 3 DAYS  Final   Report Status PENDING  Incomplete    Studies/Results: No results found.    Assessment/Plan:  INTERVAL HISTORY: paronychia area incised drained   Active Problems:   Acute renal failure (ARF) (HCC)   Sepsis due to Methicillin susceptible Staphylococcus aureus (HCC)   ICD (implantable cardioverter-defibrillator) infection ([place])   Paronychia   PICC line infection   Protein-calorie malnutrition, severe   Bacteremia   Paronychia of third finger, left   Ventricular tachycardia (HCC)    Frederik Standley is a 70 y.o. male with  SAB septic shock with AICD, paronychia, Mid line  #1.  [facility] Staphylococcus aureus bacteremia  Staphylococcus aureus bacteremia (SAB) is associated with a high rate of complications and mortality.  Specific aspects of clinical management are critical to optimizing the outcome of patients with SAB.  Therefore, the [facility] Antimicrobial Management Team [facility]) has initiated an intervention aimed at improving the management of SAB at [facility].  To do so, Infectious Diseases physicians are providing an evidence-based  consult for the management of all patients with SAB.     Yes No Comments  Perform follow-up blood cultures (even if the patient is afebrile) to ensure clearance of bacteremia [x]  []  I ORDERED ANOTHER SET OF BLOOD CULTURES TODAY THAT ARE  AFTER PICC removal  (ICD removal not planned)  Remove vascular catheter and obtain follow-up blood cultures after the removal of the catheter [x]  []  DO NOT PLACE NEW PICC UNTIL THE POST PICC BLOOD CULTURES ARE NGTD at 48 hours at least  Perform echocardiography to evaluate for endocarditis (transthoracic ECHO is 40-50% sensitive, TEE is > 90% sensitive) []  []  TEE not being pursued  Consult electrophysiologist to evaluate implanted cardiac device (pacemaker, ICD) [x]  []  AICD extraction not planned  Ensure source control []  []  Have all abscesses been drained effectively? Have deep seeded infections (septic joints or osteomyelitis) had appropriate surgical debridement?  Finger could be source. Continue to follow    Investigate for "metastatic" sites of infection []  []  Does the patient have ANY symptom or physical exam finding that would suggest a deeper infection (back or neck pain that may be suggestive of vertebral osteomyelitis or epidural abscess, muscle pain that could be a symptom of pyomyositis)?  Keep in mind that for deep seeded infections MRI imaging with contrast is preferred rather than other often insensitive tests such as plain x-rays, especially early in a patient's presentation. No other obvious sources  Change antibiotic therapy to   Nafcillin + rifampin     [x]  []  Beta-lactam antibiotics are preferred for MSSA due to higher cure rates.   If on Vancomycin, goal trough should be 15 - 20 mcg/mL  Estimated duration of IV antibiotic therapy:  6 weeks after AICD device extraction then change to po suppressive antibiotics for life [x]  []  Consult case management for probably prolonged outpatient IV antibiotic therapy   Dr. Megan Salon is covering  over the weekend.  I will arrange HSFU with our ID pharmacist in next 2 weeks and with me in 4 weeks  Diagnosis:  AICD infection and bacteremia with MSSA  Culture Result: MSSA  Allergies  Allergen Reactions  . Penicillin G Hives    Tolerated Zosyn and nafcillin in 12/2015. Most likely does not have true PCN allergy   . Fluorometholone Rash    Eye drop -> rash  . Penicillins Nausea And Vomiting  . Sulfa Antibiotics Nausea And Vomiting  . Sulfamethoxazole-Trimethoprim Nausea Only    Discharge antibiotics: Per pharmacy protocol Nafcillin IV and rifampin orally  Duration: 6 weeks  End Date:  September 14th (assuming todays cultures are No growth) then is on lifelong suppressive antibiotics    Labs weekly while on IV antibiotics: _x_ CBC with differential x__ CMP _x_ CRP _x_ ESR  Fax weekly labs to [phone]  Clinic Follow Up Appt:  2 weeks with pharmacy and 4 with me  Dr. Megan Salon is covering over the weekend.   LOS: 4 days   Alcide Evener 01/20/2016, 6:01 PM

## 2016-01-20 NOTE — Progress Notes (Signed)
Subjective: Currently, pt is pleasant and conversant. Making jokes. Asking for something to drink. Has eaten small amount of breakfast. Still only partially oriented.  Interval Events: None.  Objective: Vital signs in last 24 hours: Vitals:   01/19/16 1550 01/19/16 2101 01/19/16 2327 01/20/16 0512  BP:  105/77 107/86 104/84  Pulse:  (!) 122 (!) 119 (!) 133  Resp:  (!) 22  (!) 23  Temp: 98.1 F (36.7 C) 98.4 F (36.9 C) 98.2 F (36.8 C) 99.2 F (37.3 C)  TempSrc: Oral Oral Oral Oral  SpO2: 100% 100% 95% 96%  Weight:    132 lb 6.4 oz (60.1 kg)  Height:       Wt: +18 lbs since admission I/O: 08/03 0701 - 08/04 0700 In: 720 [P.O.:720] Out: 1250 [Urine:1250]  Physical Exam: Physical Exam  Constitutional: No distress.  Cardiovascular: Normal rate, regular rhythm and normal heart sounds.   Pulmonary/Chest: Effort normal and breath sounds normal.  Abdominal: Soft. Bowel sounds are normal. There is no tenderness.  Musculoskeletal: He exhibits no edema.  Neurological: He is disoriented (oriented to self and hospital only).  Psychiatric: Cognition and memory are impaired.   Labs: Metabolic Panel:  Recent Labs Lab 01/16/16 1036  01/16/16 2140 01/17/16 0645 01/18/16 0425 01/19/16 0328 01/20/16 0342  NA 135  < > 140 141 142 143 139  K 4.3  < > 3.3* 3.8 3.0* 3.6 3.7  CL 91*  < > 101 104 106 108 109  CO2 25  < > 29 28 27 26 23   GLUCOSE 528*  < > 85 143* 115* 82 194*  BUN 105*  < > 100* 92* 64* 40* 26*  CREATININE 3.67*  < > 3.11* 2.73* 1.77* 1.34* 1.19  CALCIUM 9.8  < > 8.7* 8.7* 8.2* 8.5* 8.1*  ALT 20  --   --   --   --   --   --   ALKPHOS 169*  --   --   --   --   --   --   BILITOT 5.2*  --   --   --   --   --   --   PROT 8.4*  --   --   --   --   --   --   ALBUMIN 3.7  --   --   --   --   --   --   LIPASE 16  --   --   --   --   --   --   LABPROT 17.1*  --   --   --   --   --   --   INR 1.38  --   --   --   --   --   --   < > = values in this interval not  displayed. BG:  Recent Labs Lab 01/19/16 0722 01/19/16 1132 01/19/16 1630 01/19/16 2249 01/20/16 0724  GLUCAP 100* 180* 166* 169* 135*    Lab Results  Component Value Date   HGBA1C 8.7 07/20/2015   Microbiology: BCx 1/2 growing MSSA, repeat BCx NGx2d  Imaging: CXR wnl.   Medications: Infusions: . milrinone 0.25 mcg/kg/min (01/19/16 1233)    Scheduled Medications: . feeding supplement (ENSURE ENLIVE)  237 mL Oral TID BM  . heparin  5,000 Units Subcutaneous Q8H  . insulin aspart  0-15 Units Subcutaneous TID WC  . insulin aspart  0-5 Units Subcutaneous QHS  . insulin glargine  5 Units Subcutaneous  QHS  . lidocaine  5 mL Intradermal Once  . nafcillin IV  2 g Intravenous Q4H  . potassium chloride  40 mEq Oral Once  . rifampin  600 mg Oral Daily   Assessment/Plan: Pt is a 70 y.o. yo male with a PMHx of CHF who was admitted on 01/16/2016 with symptoms of oliguria, hyperglycemia, and AMS which was determined to be secondary to HHS, dehydration, and bacteremia. Interventions at this time will be focused on treating his bacteremia, gentle rehydration, glycemic control.   Prerenal Azotemia / Uremia: Resolved. No with good PO intake. Good weight gain. - Consider restarting Lassix tomorrow. - Reg diet, Nutrition c/s for recs on supplements  Bacteremia: BCx 1/2 + for MSSA. Leukocytosis resolved. PICC removed, will redraw cultures tomorrow and when clear, replace PICC for 6wk IV abx. ICD to remain in place for now. - 2nd BCx x2 NGx2d, will repeat tomorrow - Trend CBC in AM - Per ID: continue rifampin PO + 6 wks Nafcillin IV, plan lifelong oral suppression Abx if ICD remains in place  Paronichia of right middle finger: s/p I&D, treated w/ warm soaks. ID notes could be source for bacteremia, wound cx +MSSA. Continue to monitor. On IV Abx for bacteremia.  CHF: LV EF 20-25% w/ ICD. Currently on milrinone through PIV. Now bacteremic. Defer discussion of GoC and palliative options  to HF as outpt. - Plan to restart Lassix tomorrow - Cont milrinone infusion - Watch fluid status  DMII: Glucoses under better control. Now on FULL diet with palliation as priority. Restarted insulins today. Receiving nafcillin w/ dextrose. - Lantus 5U qHS - mSSI qAC/qHS  HTN: Holding home BP meds. BP low currently. IVF bolus as needed.  DVT PPx: - Heparin SQ 5,000 U  Length of Stay: 4 day(s) Dispo: Anticipated discharge when clinically stable, needs 48hrs post-PICC BCx. If clear, will replace long-term access for IV Abx and can DC to SNF.  Carolynn Comment, MD  Pager: [phone] (7AM-5PM) 01/20/2016, 8:13 AM

## 2016-01-20 NOTE — Clinical Social Work Note (Signed)
Clinical Social Work Assessment  Patient Details  Name: Gary Gallegos MRN: [id number] Date of Birth: 06-30-1945  Date of referral:  01/20/16               Reason for consult:  Facility Placement                Permission sought to share information with:  Family Supports Permission granted to share information::  Yes, Verbal Permission Granted  Name::     Gary Gallegos  Relationship::  Daughter  Contact Information:   [phone]  Housing/Transportation Living arrangements for the past 2 months:  [place] of Information:  Patient, Adult Children Patient Interpreter Needed:  None Criminal Activity/Legal Involvement Pertinent to Current Situation/Hospitalization:  No - Comment as needed Significant Relationships:  Adult Children Lives with:   Daughter Do you feel safe going back to the place where you live?  Yes Need for family participation in patient care:  No (Coment)  Care giving concerns:  PT is recommending SNF.   Social Worker assessment / plan:  CSW met with pt to address consult. CSW introduced herself and explained role of social work.CSW also explained the recommendation of PT, which is SNF. Pt declined and wanted to return home. Pt shared that he lives with his daughter, and she would be able to care for him. Pt gave permission for CSW to speak with daughter. Pt is currently confused and could not explained why he was in [place] while he lives in [place].   CSW spoke with daughter to address consult. CSW explained process of discharging to SNF versus home health services. Pt's daughter shared that pt and her lived in [place] until 2014 until they moved back to [place]. Pt was impressed with his cardiologist in [place], therefore will commute for appointments. Pt's daughter shared that she would like for pt to return home and would like to continue with home health services through [facility] [phone]).   CSW  updated RNCM. CSW is signing off as no further needs identified. Please reconsult if a need arises prior to discharge.   Employment status:   Retired Nurse, adult PT Recommendations:  Green / Referral to community resources:  Other (Comment Required) (Private Duty Care List)  Patient/Family's Response to care:  Pt's daughter was appreciative of CSW support.   Patient/Family's Understanding of and Emotional Response to Diagnosis, Current Treatment, and Prognosis:  Pt's daughter would like for pt to return home as she promised him that he would not go to a nursing home.   Emotional Assessment Appearance:  Appears stated age Attitude/Demeanor/Rapport:   (Appropriate) Affect (typically observed):  Accepting, Pleasant Orientation:  Oriented to Self, Fluctuating Orientation (Suspected and/or reported Sundowners) Alcohol / Substance use:  Never Used Psych involvement (Current and /or in the community):  No (Comment)  Discharge Needs  Concerns to be addressed:  Adjustment to Illness Readmission within the last 30 days:  No Current discharge risk:  Chronically ill Barriers to Discharge:  Continued Medical Work up   [employer], LCSW 01/20/2016, 4:43 PM

## 2016-01-20 NOTE — Progress Notes (Signed)
Pharmacy Antibiotic Note  Gary Gallegos is a 70 y.o. male admitted on 01/16/2016 with MSSA bacteremia and an ICD placed. Pharmacy has been consulted for nafcillin dosing.  Also on rifampin due to presence of AICD.  History of penicillin allergy, but tolerating nafcillin without signs of allergic reaction.  Plan: Continue nafcillin 2g IV Q4 F/U goals of care, LOT   Height: 5\' 8"  (172.7 cm) Weight: 132 lb 6.4 oz (60.1 kg) IBW/kg (Calculated) : 68.4  Temp (24hrs), Avg:98.5 F (36.9 C), Min:98.1 F (36.7 C), Max:99.2 F (37.3 C)   Recent Labs Lab 01/16/16 1036  01/16/16 1718  01/16/16 1748 01/16/16 2140 01/17/16 0435 01/17/16 0645 01/17/16 0934 01/18/16 0425 01/19/16 0328 01/20/16 0342 01/20/16 1055  WBC 15.1*  --   --   --   --   --  12.5*  --   --  7.2 6.8  --  6.9  CREATININE 3.67*  < > 3.50*  < >  --  3.11*  --  2.73*  --  1.77* 1.34* 1.19  --   LATICACIDVEN  --   --  5.0*  --  4.9*  --   --   --  1.3  --   --   --   --   < > = values in this interval not displayed.  Estimated Creatinine Clearance: 50.5 mL/min (by C-G formula based on SCr of 1.19 mg/dL).    Allergies  Allergen Reactions  . Penicillin G Hives    Tolerated Zosyn and nafcillin in 12/2015. Most likely does not have true PCN allergy   . Fluorometholone Rash    Eye drop -> rash  . Penicillins Nausea And Vomiting  . Sulfa Antibiotics Nausea And Vomiting  . Sulfamethoxazole-Trimethoprim Nausea Only    Antimicrobials this admission: 7/31 BCx: 1/2 MSSA 7/31 urine: multiple species 8/1 BCx x 2 > ngtd 8/4 BCx x 2 >  Dose adjustments this admission: n/a  Microbiology results: 7/31 BCx: 1/2 MSSA 7/31 urine: multiple species 8/1 BCx x 2 > ngtd 8/4 BCx x 2 >  Thank you for allowing pharmacy to be a part of this patient's care.  Tad Moore, BCPS  Clinical Pharmacist Pager [phone]  01/20/2016 12:49 PM

## 2016-01-20 NOTE — Progress Notes (Signed)
Advanced Heart Failure Rounding Note  Referring Provider: Dr. Lynelle Doctor Primary Physician:  Pincus Badder, MD Primary Cardiologist:  Dr. [name]   Reason for Admission: A/C systolic HF  Subjective:    Admitted 01/16/16 with N/V, decreased appetite, Acute renal failure, and hyperglycemic nonketotic state. With Elevated WBCs and hypotension concerns for sepsis.  Pan cultured.  Blood cultures 01/17/16 + for staph aureus.  Lactic acid 01/16/16 4.9.   WBC 15.1 -> 6.8 -> Pending. Remains on rifampin  Afebrile.  Feels OK today.  Likes liquid potassium a lot better than the pills.  Seems more with it today but still confused.   Weight shows up 4 lbs.  Suspect initial weight of 112 lbs was error. Overall positive 4.9 L this admission.   Objective:   Weight Range: 132 lb 6.4 oz (60.1 kg) Body mass index is 20.13 kg/m.   Vital Signs:   Temp:  [98.1 F (36.7 C)-99.2 F (37.3 C)] 99.2 F (37.3 C) (08/04 0512) Pulse Rate:  [119-133] 133 (08/04 0512) Resp:  [22-23] 23 (08/04 0512) BP: (104-107)/(77-86) 104/84 (08/04 0512) SpO2:  [95 %-100 %] 96 % (08/04 0512) Weight:  [132 lb 6.4 oz (60.1 kg)] 132 lb 6.4 oz (60.1 kg) (08/04 0512) Last BM Date: 01/19/16  Weight change: Filed Weights   01/18/16 0424 01/19/16 0435 01/20/16 0512  Weight: 128 lb 1.4 oz (58.1 kg) 128 lb 11.2 oz (58.4 kg) 132 lb 6.4 oz (60.1 kg)    Intake/Output:   Intake/Output Summary (Last 24 hours) at 01/20/16 1011 Last data filed at 01/20/16 0825  Gross per 24 hour  Intake              600 ml  Output             1250 ml  Net             -650 ml     Physical Exam: General:  Chronically ill and elderly appearing.  HEENT: normal Neck: supple. JVD to jaw. Carotids 2+ bilat; no bruits. No thyromegaly or nodule noted. Cor: PMI laterally displaced. Regular. No rubs. + S3; +3/6 systolic murmur PICC removed. Site C/D/I Lungs: CTAB, normal effort. Abdomen: soft, NT, ND, no HSM. No bruits or masses. +BS  Extremities:  no cyanosis, clubbing, rash, edema.  Left 3rd finger with dressing Neuro: alert & oriented. Confused at times cranial nerves grossly intact. moves all 4 extremities w/o difficulty. Affect pleasant.  Telemetry: Reviewed personally, NSR/ST 100-110s  Labs: CBC  Recent Labs  01/18/16 0425 01/19/16 0328  WBC 7.2 6.8  HGB 12.6* 13.1  HCT 40.7 42.6  MCV 81.9 81.8  PLT 144* 148*   Basic Metabolic Panel  Recent Labs  01/19/16 0328 01/20/16 0342  NA 143 139  K 3.6 3.7  CL 108 109  CO2 26 23  GLUCOSE 82 194*  BUN 40* 26*  CREATININE 1.34* 1.19  CALCIUM 8.5* 8.1*   Liver Function Tests No results for input(s): AST, ALT, ALKPHOS, BILITOT, PROT, ALBUMIN in the last 72 hours. No results for input(s): LIPASE, AMYLASE in the last 72 hours. Cardiac Enzymes  Recent Labs  01/17/16 1630 01/17/16 2005  TROPONINI 0.08* 0.08*    BNP: BNP (last 3 results)  Recent Labs  01/16/16 1036  BNP 626.2*    ProBNP (last 3 results) No results for input(s): PROBNP in the last 8760 hours.   D-Dimer No results for input(s): DDIMER in the last 72 hours. Hemoglobin A1C No results for  input(s): HGBA1C in the last 72 hours. Fasting Lipid Panel No results for input(s): CHOL, HDL, LDLCALC, TRIG, CHOLHDL, LDLDIRECT in the last 72 hours. Thyroid Function Tests No results for input(s): TSH, T4TOTAL, T3FREE, THYROIDAB in the last 72 hours.  Invalid input(s): FREET3  Other results:     Imaging/Studies:  Ir Removal Tun Cv Cath W/o Fl  Result Date: 01/18/2016 INDICATION: Bacteremia, request made for removal of tunneled IJ PICC that is in place for over a year and a half. The central venous catheter was placed at an outside institution. EXAM: REMOVAL OF TUNNELED PICC line MEDICATIONS: None COMPLICATIONS: None immediate. PROCEDURE: Informed verbal consent was obtained from the patient following an explanation of the procedure, risks, benefits and alternatives to treatment. Sterile technique  was utilized including mask, sterile gloves, sterile drape, hand hygiene, and ChloraPrep. Utilizing a combination of blunt dissection and gentle traction, the catheter was removed. The cuff remained scarred into the patient and was not removed. Hemostasis was obtained with manual compression. A dressing was placed. The patient tolerated the procedure well without immediate post procedural complication. IMPRESSION: Successful removal of tunneled PICC line. Read by: Barnetta Chapel, PA-C Electronically Signed   By: Simonne Come M.D.   On: 01/18/2016 16:38    Latest Echo  Latest Cath   Medications:     Scheduled Medications: . feeding supplement (ENSURE ENLIVE)  237 mL Oral TID BM  . heparin  5,000 Units Subcutaneous Q8H  . insulin aspart  0-15 Units Subcutaneous TID WC  . insulin aspart  0-5 Units Subcutaneous QHS  . insulin glargine  5 Units Subcutaneous QHS  . lidocaine  5 mL Intradermal Once  . nafcillin IV  2 g Intravenous Q4H  . potassium chloride  40 mEq Oral Once  . rifampin  600 mg Oral Daily    Infusions: . milrinone 0.25 mcg/kg/min (01/20/16 0851)    PRN Medications: acetaminophen, ondansetron **OR** ondansetron (ZOFRAN) IV   Assessment   1. Bacteremia - BCx + with Staph Aureus 2. Chronic systolic CHF 3. Hyperglycemic nonketotic state 4. ARF on CKD stage III 5. Paronychia; left middle finger  Plan    Renal function much improved with IV hydration and ABX.   Remains somewhat tachycardic.  PICC line out 01/18/16.  Plan on repeating Blood Cultures today as 48 hr surveillance s/p PICC line removal as originally planned. If negative will replace PICC line and treat for 6 weeks with IV ABX follwed with oral suppressive therapy for life.  ICD extraction discussed but consensus is he would not tolerate procedure with end stage HF.   Will defer TEE for now. Will see how much he improves with treatment of bacteremia. He may be approaching hospice.   Becoming volume  overloaded now with IVF. Stop fluids. Resume torsemide at decreased dose of 40 mg daily. Will follow to increase as needed.   Length of Stay: 4  Luane School 01/20/2016, 10:11 AM  Advanced Heart Failure Team Pager [phone] (M-F; 7a - 4p)  Please contact [facility] for night-coverage after hours (4p -7a ) and weekends on [email or web address]  Patient seen and examined with Otilio Saber, PA-C. We discussed all aspects of the encounter. I agree with the assessment and plan as stated above.   Remains very debilitated and weak. Still confused. Volume status going back up. Renal function normalized. Stop IVF. Resume diuretics. PICC to be replaced. Continue milrinone and IV abx. Would not d/c to SNF until volume status improved some. Palliative  care discussions ongoing. I will need to speak to his daughter over the weekend.   Blessing Zaucha,MD 5:45 PM

## 2016-01-20 NOTE — Progress Notes (Signed)
  Date: 01/20/2016  Patient name: Gary Gallegos  Medical record number: [id number]  Date of birth: 1946/03/20   This patient has been seen and the plan of care was discussed with the house staff. Please see their note for complete details. I concur with their findings with the following additions/corrections: Mr Lockamy cont to be in good mood. PICC out. Blood cx today. PICC in once cxs neg 48 hrs. Cont ABX IV 6 weeks then oral suppressive tx. Diuretics started HF team today.  Burns Spain, MD 01/20/2016, 3:06 PM

## 2016-01-20 NOTE — Progress Notes (Signed)
      INFECTIOUS DISEASE ATTENDING ADDENDUM:   Date: 01/20/2016  Patient name: Gary Gallegos  Medical record number: [id number]  Date of birth: 1946/04/19    NOTE IT DOES NOT APPEAR THAT BLOOD CULTURES WERE DRAWN AFTER PICC REMOVAL ON 01/18/16  WE NEED TO ENSURE THOSE BLOOD CULTURES ARE NEGATIVE BEFORE PUTTING IN A NEW PICC  I HAVE ORDERED REPEAT BLOOD CULTURES TODAY   Paulette Blanch Dam 01/20/2016, 9:07 AM

## 2016-01-21 LAB — GLUCOSE, CAPILLARY
GLUCOSE-CAPILLARY: 124 mg/dL — AB (ref 65–99)
GLUCOSE-CAPILLARY: 160 mg/dL — AB (ref 65–99)
GLUCOSE-CAPILLARY: 188 mg/dL — AB (ref 65–99)
Glucose-Capillary: 180 mg/dL — ABNORMAL HIGH (ref 65–99)

## 2016-01-21 LAB — BASIC METABOLIC PANEL
ANION GAP: 10 (ref 5–15)
BUN: 19 mg/dL (ref 6–20)
CHLORIDE: 105 mmol/L (ref 101–111)
CO2: 21 mmol/L — AB (ref 22–32)
Calcium: 8.1 mg/dL — ABNORMAL LOW (ref 8.9–10.3)
Creatinine, Ser: 1.18 mg/dL (ref 0.61–1.24)
GFR calc non Af Amer: >60 mL/min (ref >60)
GLUCOSE: 189 mg/dL — AB (ref 65–99)
Potassium: 3.4 mmol/L — ABNORMAL LOW (ref 3.5–5.1)
Sodium: 136 mmol/L (ref 135–145)

## 2016-01-21 LAB — CBC WITH DIFFERENTIAL/PLATELET
BASOS PCT: 0 %
Basophils Absolute: 0 10*3/uL (ref 0.0–0.1)
EOS PCT: 1 %
Eosinophils Absolute: 0.1 10*3/uL (ref 0.0–0.7)
HEMATOCRIT: 39.4 % (ref 39.0–52.0)
HEMOGLOBIN: 12.4 g/dL — AB (ref 13.0–17.0)
LYMPHS PCT: 12 %
Lymphs Abs: 0.9 10*3/uL (ref 0.7–4.0)
MCH: 25.9 pg — AB (ref 26.0–34.0)
MCHC: 31.5 g/dL (ref 30.0–36.0)
MCV: 82.3 fL (ref 78.0–100.0)
MONOS PCT: 8 %
Monocytes Absolute: 0.6 10*3/uL (ref 0.1–1.0)
NEUTROS PCT: 79 %
Neutro Abs: 6.3 10*3/uL (ref 1.7–7.7)
Platelets: 124 10*3/uL — ABNORMAL LOW (ref 150–400)
RBC: 4.79 MIL/uL (ref 4.22–5.81)
RDW: 19.8 % — ABNORMAL HIGH (ref 11.5–15.5)
WBC: 7.9 10*3/uL (ref 4.0–10.5)

## 2016-01-21 LAB — CULTURE, BLOOD (ROUTINE X 2): Culture: NO GROWTH

## 2016-01-21 MED ORDER — TORSEMIDE 20 MG PO TABS: 60.0000 mg | ORAL_TABLET | Freq: Every day | ORAL | Status: DC

## 2016-01-21 NOTE — Progress Notes (Signed)
Advanced Heart Failure Rounding Note  Referring Provider: Dr. Lynelle Doctor Primary Physician:  Pincus Badder, MD Primary Cardiologist:  Dr. [name]   Reason for Admission: A/C systolic HF  Subjective:    PICC out. Remains on abx.  Started back on diuretics yesterday.  Weight down one pound. Remains weak.  Coughing. Denies dyspnea or orthopnea. HR remains in 120s.    Objective:   Weight Range: 59.6 kg (131 lb 8 oz) Body mass index is 19.99 kg/m.   Vital Signs:   Temp:  [98.8 F (37.1 C)-99.6 F (37.6 C)] 99.3 F (37.4 C) (08/05 0500) Pulse Rate:  [68-130] 126 (08/05 0500) Resp:  [20] 20 (08/05 0500) BP: (93-113)/(60-83) 102/83 (08/05 0500) SpO2:  [96 %-99 %] 97 % (08/05 0500) Weight:  [59.6 kg (131 lb 8 oz)] 59.6 kg (131 lb 8 oz) (08/05 0500) Last BM Date: 01/19/16  Weight change: Filed Weights   01/19/16 0435 01/20/16 0512 01/21/16 0500  Weight: 58.4 kg (128 lb 11.2 oz) 60.1 kg (132 lb 6.4 oz) 59.6 kg (131 lb 8 oz)    Intake/Output:   Intake/Output Summary (Last 24 hours) at 01/21/16 0811 Last data filed at 01/20/16 1940  Gross per 24 hour  Intake              600 ml  Output              400 ml  Net              200 ml     Physical Exam: General:  Chronically ill and elderly appearing.  HEENT: normal Neck: supple. JVD to jaw. Carotids 2+ bilat; no bruits. No thyromegaly or nodule noted. Cor: PMI laterally displaced. Regular. Tachy  No rubs. + S3; +3/6 systolic murmur PICC removed. Site C/D/I Lungs: CTAB, normal effort. Abdomen: soft, NT, ND, no HSM. No bruits or masses. +BS  Extremities: no cyanosis, clubbing, rash, edema.  Left 3rd finger with dressing Neuro: alert & oriented. Confused at times cranial nerves grossly intact. moves all 4 extremities w/o difficulty. Affect pleasant.  Telemetry: Reviewed personally, NSR/ST 120s  Labs: CBC  Recent Labs  01/20/16 1055 01/21/16 0345  WBC 6.9 7.9  NEUTROABS 5.4 6.3  HGB 12.3* 12.4*  HCT 39.3 39.4    MCV 81.7 82.3  PLT 126* 124*   Basic Metabolic Panel  Recent Labs  01/20/16 0342 01/21/16 0345  NA 139 136  K 3.7 3.4*  CL 109 105  CO2 23 21*  GLUCOSE 194* 189*  BUN 26* 19  CREATININE 1.19 1.18  CALCIUM 8.1* 8.1*   Liver Function Tests No results for input(s): AST, ALT, ALKPHOS, BILITOT, PROT, ALBUMIN in the last 72 hours. No results for input(s): LIPASE, AMYLASE in the last 72 hours. Cardiac Enzymes No results for input(s): CKTOTAL, CKMB, CKMBINDEX, TROPONINI in the last 72 hours.  BNP: BNP (last 3 results)  Recent Labs  01/16/16 1036  BNP 626.2*    ProBNP (last 3 results) No results for input(s): PROBNP in the last 8760 hours.   D-Dimer No results for input(s): DDIMER in the last 72 hours. Hemoglobin A1C No results for input(s): HGBA1C in the last 72 hours. Fasting Lipid Panel No results for input(s): CHOL, HDL, LDLCALC, TRIG, CHOLHDL, LDLDIRECT in the last 72 hours. Thyroid Function Tests No results for input(s): TSH, T4TOTAL, T3FREE, THYROIDAB in the last 72 hours.  Invalid input(s): FREET3  Other results:     Imaging/Studies:  No results found.  Latest Echo  Latest Cath   Medications:     Scheduled Medications: . feeding supplement (ENSURE ENLIVE)  237 mL Oral TID BM  . heparin  5,000 Units Subcutaneous Q8H  . insulin aspart  0-15 Units Subcutaneous TID WC  . insulin aspart  0-5 Units Subcutaneous QHS  . insulin glargine  5 Units Subcutaneous QHS  . lidocaine  5 mL Intradermal Once  . nafcillin IV  2 g Intravenous Q4H  . potassium chloride  40 mEq Oral Daily  . rifampin  600 mg Oral Daily  . torsemide  40 mg Oral Daily    Infusions: . milrinone 0.25 mcg/kg/min (01/21/16 0603)    PRN Medications: acetaminophen, ondansetron **OR** ondansetron (ZOFRAN) IV   Assessment   1. Bacteremia - BCx + with Staph Aureus 2. Acute on chronic systolic CHF 3. Hyperglycemic nonketotic state 4. ARF on CKD stage III 5. Paronychia; left  middle finger  Plan    Remains very debilitated and weak. He is oriented this am.  Volume status back to baseline. Renal function normalized. Continue milrinone and IV abx. We have restarted diuretics at a lower dose. Will follow volume status. He remains tachycardic. Prognosis remains poor.  Palliative care discussions ongoing. I will need to speak to his daughter over the weekend.   Length of Stay: 5  Arvilla Meres, MD 01/21/2016, 8:11 AM  Advanced Heart Failure Team Pager [phone] (M-F; 7a - 4p)  Please contact [facility] for night-coverage after hours (4p -7a ) and weekends on [email or web address]

## 2016-01-22 LAB — CULTURE, BLOOD (ROUTINE X 2)
CULTURE: NO GROWTH
Culture: NO GROWTH

## 2016-01-22 LAB — CBC WITH DIFFERENTIAL/PLATELET
BASOS ABS: 0 10*3/uL (ref 0.0–0.1)
Basophils Relative: 0 %
EOS ABS: 0.1 10*3/uL (ref 0.0–0.7)
EOS PCT: 1 %
HCT: 36.9 % — ABNORMAL LOW (ref 39.0–52.0)
HEMOGLOBIN: 12.5 g/dL — AB (ref 13.0–17.0)
LYMPHS ABS: 0.6 10*3/uL — AB (ref 0.7–4.0)
Lymphocytes Relative: 6 %
MCH: 27.1 pg (ref 26.0–34.0)
MCHC: 33.9 g/dL (ref 30.0–36.0)
MCV: 79.9 fL (ref 78.0–100.0)
MONOS PCT: 9 %
Monocytes Absolute: 0.8 10*3/uL (ref 0.1–1.0)
NEUTROS PCT: 84 %
Neutro Abs: 8 10*3/uL — ABNORMAL HIGH (ref 1.7–7.7)
PLATELETS: 128 10*3/uL — AB (ref 150–400)
RBC: 4.62 MIL/uL (ref 4.22–5.81)
RDW: 20.2 % — ABNORMAL HIGH (ref 11.5–15.5)
WBC: 9.5 10*3/uL (ref 4.0–10.5)

## 2016-01-22 LAB — BASIC METABOLIC PANEL
Anion gap: 9 (ref 5–15)
BUN: 21 mg/dL — AB (ref 6–20)
CHLORIDE: 105 mmol/L (ref 101–111)
CO2: 23 mmol/L (ref 22–32)
CREATININE: 1.47 mg/dL — AB (ref 0.61–1.24)
Calcium: 7.9 mg/dL — ABNORMAL LOW (ref 8.9–10.3)
GFR calc Af Amer: 55 mL/min — ABNORMAL LOW (ref >60)
GFR calc non Af Amer: 47 mL/min — ABNORMAL LOW (ref >60)
Glucose, Bld: 128 mg/dL — ABNORMAL HIGH (ref 65–99)
Potassium: 3.5 mmol/L (ref 3.5–5.1)
Sodium: 137 mmol/L (ref 135–145)

## 2016-01-22 LAB — GLUCOSE, CAPILLARY
GLUCOSE-CAPILLARY: 64 mg/dL — AB (ref 65–99)
Glucose-Capillary: 83 mg/dL (ref 65–99)
Glucose-Capillary: 95 mg/dL (ref 65–99)
Glucose-Capillary: 58 mg/dL — ABNORMAL LOW (ref 65–99)
Glucose-Capillary: 92 mg/dL (ref 65–99)

## 2016-01-22 LAB — LACTIC ACID, PLASMA: Lactic Acid, Venous: 1.4 mmol/L (ref 0.5–1.9)

## 2016-01-22 MED ORDER — CLOTRIMAZOLE 1 % EX CREA
TOPICAL_CREAM | Freq: Two times a day (BID) | CUTANEOUS | Status: DC
Start: 2016-01-22 — End: 2016-01-24
  Administered 2016-01-22 – 2016-01-23 (×3): via TOPICAL
  Administered 2016-01-23: 1 via TOPICAL
  Administered 2016-01-24: 12:00:00 via TOPICAL
  Filled 2016-01-22: qty 15

## 2016-01-22 NOTE — Progress Notes (Signed)
Subjective: This morning, he did not look well. He was oriented x 2 but was coughing and had poor appetite.   In the afternoon, I was called back to the room for rectal tube placement given his profuse diarrhea and was concerned given his low blood pressure, cool extremities to touch, low urine output.  This evening, I called his daughter Gary Gallegos who was upset but agreed that the best thing for her father would be to come home with hospice. I expressed that I will have to see how we go about the milrinone and ICD.  Objective: Vital signs in last 24 hours: Vitals:   01/21/16 2100 01/22/16 0443 01/22/16 0835 01/22/16 1432  BP: 96/75 94/76 (!) 84/67 (!) 85/74  Pulse: (!) 117 (!) 120 (!) 115 (!) 146  Resp: (!) 28 (!) 44 14   Temp: 98.6 F (37 C) 98.7 F (37.1 C)  98.6 F (37 C)  TempSrc:    Oral  SpO2: 94%  97% 91%  Weight:  141 lb 14.4 oz (64.4 kg)    Height:       Wt: +18 lbs since admission I/O: 08/05 0701 - 08/06 0700 In: 546.8 [I.V.:46.8; IV Piggyback:500] Out: 525 [Urine:525]  Physical Exam: Physical Exam  Constitutional: No distress.  HENT:  Head: Normocephalic and atraumatic.  Eyes: Conjunctivae are normal.  Cardiovascular: Regular rhythm and normal heart sounds.   Tachycardic  Pulmonary/Chest: Effort normal and breath sounds normal.  Abdominal: Soft. Bowel sounds are normal. There is no tenderness.  Musculoskeletal: He exhibits no edema.  Neurological: He is disoriented (oriented to self and hospital only).  Skin:  Cool to touch with extremities  Psychiatric: Cognition and memory are impaired.   Labs: Metabolic Panel:  Recent Labs Lab 01/16/16 1036  01/18/16 0425 01/19/16 0328 01/20/16 0342 01/21/16 0345 01/22/16 0353  NA 135  < > 142 143 139 136 137  K 4.3  < > 3.0* 3.6 3.7 3.4* 3.5  CL 91*  < > 106 108 109 105 105  CO2 25  < > 21* 23  GLUCOSE 528*  < > 115* 82 194* 189* 128*  BUN 105*  < > 64* 40* 26* 19 21*  CREATININE 3.67*  < > 1.77*  1.34* 1.19 1.18 1.47*  CALCIUM 9.8  < > 8.2* 8.5* 8.1* 8.1* 7.9*  ALT 20  --   --   --   --   --   --   ALKPHOS 169*  --   --   --   --   --   --   BILITOT 5.2*  --   --   --   --   --   --   PROT 8.4*  --   --   --   --   --   --   ALBUMIN 3.7  --   --   --   --   --   --   LIPASE 16  --   --   --   --   --   --   LABPROT 17.1*  --   --   --   --   --   --   INR 1.38  --   --   --   --   --   --   < > = values in this interval not displayed. BG:  Recent Labs Lab 01/22/16 0730 01/22/16 1117 01/22/16 1632 01/22/16 1705 01/22/16 1723  GLUCAP 83 95  58* 64* 92    Lab Results  Component Value Date   HGBA1C 8.7 07/20/2015   Microbiology: BCx 1/2 growing MSSA, repeat BCx NGx2d  Imaging: None    Medications: Infusions: . milrinone 0.25 mcg/kg/min (01/22/16 1010)    Scheduled Medications: . clotrimazole   Topical BID  . feeding supplement (ENSURE ENLIVE)  237 mL Oral TID BM  . heparin  5,000 Units Subcutaneous Q8H  . insulin aspart  0-15 Units Subcutaneous TID WC  . insulin aspart  0-5 Units Subcutaneous QHS  . insulin glargine  5 Units Subcutaneous QHS  . lidocaine  5 mL Intradermal Once  . nafcillin IV  2 g Intravenous Q4H  . potassium chloride  40 mEq Oral Daily  . rifampin  600 mg Oral Daily   Assessment/Plan: Pt is a 70 y.o. yo male with end-stage CHF who was admitted on 01/16/2016 with symptoms of oliguria, hyperglycemia, and AMS which was determined to be secondary to HHS, dehydration, and bacteremia now found to have signs of oliguric kidney injury due to cardiogenic shock.  Oliguric acute kidney injury secondary to cardiogenic shock: 2/2 end-stage CHF. Urine output 525cc all day today which is less than 5mg /kg/hr and bump in Crt today from 1.2 to 1.5. Given his poor prognosis, we will shift our focus to prioritze his comfort and alleviate any suffering that he may experience in his time remaining with [place].  -Consult case management to set up home hospice  tomorrow -Discontinue all lab draws -Discontinue DVT prophylaxis -Discontinue telemetry monitoring  MSSA bacteremia: BCx 1/2 + for MSSA. Continue IV nafcillin and rifampin for now but will discontinue prior to discharge.Marland Kitchen  DMII: Discontinue sliding scale insulin.  DVT PPx: Discontinue heparin  Length of Stay: 6 day(s)  Dispo: Anticipated discharge tomorrow to home hospice.  Beather Arbour, MD  Pager: [phone] (7AM-5PM) 01/22/2016, 6:53 PM

## 2016-01-22 NOTE — Progress Notes (Signed)
Pt having multiple episodes loose stools.  Having coughing/gagging spells.  Incontinent of stool at times.  Stool has no odor.  Rectum and gluteal folds very excoriated and sore.  Will ask MD to consider Rectal Tube.  This would be very beneficial to improve comfort and decrease skin irritation.

## 2016-01-23 DIAGNOSIS — N289 Disorder of kidney and ureter, unspecified: Secondary | ICD-10-CM

## 2016-01-23 DIAGNOSIS — N189 Chronic kidney disease, unspecified: Secondary | ICD-10-CM

## 2016-01-23 DIAGNOSIS — R570 Cardiogenic shock: Secondary | ICD-10-CM

## 2016-01-23 LAB — C DIFFICILE QUICK SCREEN W PCR REFLEX
C Diff antigen: NEGATIVE
C Diff interpretation: NOT DETECTED
C Diff toxin: NEGATIVE

## 2016-01-23 LAB — GLUCOSE, CAPILLARY: Glucose-Capillary: 130 mg/dL — ABNORMAL HIGH (ref 65–99)

## 2016-01-23 MED ORDER — GUAIFENESIN-DM 100-10 MG/5ML PO SYRP
5.0000 mL | ORAL_SOLUTION | ORAL | Status: DC | PRN
  Administered 2016-01-23 – 2016-01-24 (×2): 5 mL via ORAL
  Filled 2016-01-23 (×2): qty 5

## 2016-01-23 NOTE — Progress Notes (Signed)
  Date: 01/23/2016  Patient name: Jarreau Diedrick  Medical record number: [id number]  Date of birth: 08/15/1945   This patient has been seen and the plan of care was discussed with the house staff. Please see their note for complete details. I concur with their findings with the following additions/corrections: Pt's daughter was at bedside and stated he was up all night and talkative. Currently, somnolent and only grunts to verbal and tactile stimuli. Drs Allena Katz and Marinda Elk discussing GOC and dispo plans with family and Dr B. Final plan is to watch 24 hrs and clarify plans then. Issues are oliguric renal failure (200 cc urine recorded yesterday, none recorded today and no urine in bag on rounds), milrinone infusion, ICD, ABX (causing sig D and excoriated skin). Will assess in AM.   Burns Spain, MD 01/23/2016, 5:19 PM

## 2016-01-23 NOTE — Care Management Important Message (Signed)
Important Message  Patient Details  Name: Gary Gallegos MRN: [id number] Date of Birth: [birth year]   Medicare Important Message Given:  Yes    Mang Hazelrigg Abena 01/23/2016, 11:11 AM

## 2016-01-23 NOTE — Discharge Summary (Signed)
Name: Gary Gallegos MRN: [id number] DOB: June 21, 1945 70 y.o. PCP: Fuller Plan, MD  Date of Admission: 01/16/2016 10:34 AM Date of Discharge: 01/24/2016 Attending Physician: Burns Spain, MD  Discharge Diagnosis: 1. End-stage HF 2. ARF 3. Bacteremia w/ ICD  Active Problems:   Acute renal failure (ARF) (HCC)   Sepsis due to Methicillin susceptible Staphylococcus aureus (HCC)   ICD (implantable cardioverter-defibrillator) infection (HCC)   Paronychia   PICC line infection   Protein-calorie malnutrition, severe   Bacteremia   Paronychia of third finger, left   Ventricular tachycardia (HCC)   Acute on chronic renal insufficiency (HCC)   Discharge Medications:   Medication List    STOP taking these medications   aspirin EC 81 MG tablet   carvedilol 3.125 MG tablet Commonly known as:  COREG   Insulin Glargine 100 UNIT/ML Solostar Pen Commonly known as:  LANTUS   Insulin Pen Needle 31G X 4 MM Misc   Magnesium 200 MG Tabs   metolazone 5 MG tablet Commonly known as:  ZAROXOLYN   milrinone 20 MG/100ML Soln infusion Commonly known as:  PRIMACOR   pantoprazole 20 MG tablet Commonly known as:  PROTONIX   potassium chloride SA 20 MEQ tablet Commonly known as:  K-DUR,KLOR-CON   simvastatin 5 MG tablet Commonly known as:  ZOCOR   torsemide 20 MG tablet Commonly known as:  DEMADEX     TAKE these medications   mirtazapine 15 MG tablet Commonly known as:  REMERON Take 0.5 tablets (7.5 mg total) by mouth at bedtime.   nafcillin 2 g in dextrose 5 % 100 mL Inject 2 g into the vein every 4 (four) hours.   rifampin 300 MG capsule Commonly known as:  RIFADIN Take 2 capsules (600 mg total) by mouth daily.   sertraline 50 MG tablet Commonly known as:  ZOLOFT Take 1 tablet (50 mg total) by mouth daily.       Disposition and follow-up:   GaryMarcellius Gallegos was discharged from [facility] in Serious condition.  At the hospital follow up  visit please address:  1.  Gary Gallegos was discharged to home with health and hospice trial for 2 weeks. Please assist patient with finding a new PCP near [place] to reduce necessity of travel to [place].  2.  Labs / imaging needed at time of follow-up: none  3.  Pending labs/ test needing follow-up: none  Follow-up Appointments: [facility] Course by problem list: Active Problems:   Acute renal failure (ARF) (HCC)   Sepsis due to Methicillin susceptible Staphylococcus aureus (HCC)   ICD (implantable cardioverter-defibrillator) infection (HCC)   Paronychia   PICC line infection   Protein-calorie malnutrition, severe   Bacteremia   Paronychia of third finger, left   Ventricular tachycardia (HCC)   Acute on chronic renal insufficiency (HCC)   1. End-stage heart failure complicated by bacteremia in the setting of indwelling medical device: Pt was admitted on 01/16/2016 with symptoms of oliguria, hyperglycemia, and AMS which was determined to be secondary to HHS, dehydration, and MSSA bacteremia. He had an indwelling PICC line for home milrinone infusions and an ICD device. The PICC was removed and repeat culture were NGx3D. The ICD was not removed, however, 2/2 high risk in end-stage HF. Pt began to deteriorate despite supportive care w/ N/V, diarrhea, and progressive somnolence. He was noted to have oliguric ARF on HD 6. His UOP continued to be low w/ <235mL daily, but he did become more  alert by the time of discharge. At that time, Goals of Care discussion with the family and heart failure teams determined that the patient would continue with milrinone/ICD and antibiotics treatment at home. The patient developed significant diarrhea as a result of antibiotic therapy. He was noted to have some skin break-down peri-rectally. The primary team gave their recommendations for discontinuation of antibiotics in order to assist the patient with his quality of life at home. However, the  family and the patient expressed a desire to continue antibiotics and attempt supportive care at home. A new PICC line was place on the day of discharge for continued home IV infusions. Home health was set up. Given the poor prognosis of end-stage heart failure now with bacteremia, pt agreed to trial of hospice care for 2 weeks. The heart failure team will continue to follow up with the patient on milrinone/ICD. Hospice will assist the patient in finding a PCP in [place] in order to assist the patient with staying in his home and reducing travel to [place].  Discharge Vitals:   BP 104/65 (BP Location: Left Arm)   Pulse (!) 122   Temp 99.3 F (37.4 C) (Oral)   Resp 16   Ht  (1.727 m)   Wt 146 lb 3.2 oz (66.3 kg)   SpO2 96%   BMI 22.23 kg/m   Procedures Performed:  Removal PICC line. Placement of new PICC line.  Consultations: Treatment Team:  Rounding [facility], MD  Discharge Instructions: Discharge Instructions    Discharge instructions    Complete by:  As directed   You will be discharged to home with a trial of hospice care for 2 weeks. We will continue your milrinone and antibiotic medications during this time. Please be aware that your symptoms of diarrhea will not resolve while we continue to give you the antibiotics. It will be very important to attempt to keep your bottom clean and dry to prevent break-down of the skin and the reduce the risk of infection on your bottom.  If you decide that you do not want to continue the antibiotic due to the diarrhea, please call us and we will cancel the order. Hospice and your home health will be able to help you with this if needed. The antibiotics are helping to keep the infection in your blood from coming back, but the side-effects are unfortunately unavoidable.  You should continue to follow up with the heart failure team with regards to your milrinone infusion and ICD.     Signed: Carolynn Comment, MD 01/24/2016, 12:01 PM     Pager: [phone]

## 2016-01-23 NOTE — Progress Notes (Signed)
Chaplain provided some emotional care and tried to meet the Pt.'s  spiritual needs/ request. If family is in need is page the chaplain again   Thanks,

## 2016-01-23 NOTE — Progress Notes (Signed)
Advanced Heart Failure Rounding Note  Referring Provider: Dr. Lynelle Doctor Primary Physician:  Gary Badder, MD Primary Cardiologist:  Dr. [name]   Reason for Admission: A/C systolic HF  Subjective:    PICC out. Remains on abx.  Repeat BCx 01/20/16 NGTD.   Started back on diuretics 01/21/16.  Weight down 3 lbs in setting of diarrhea. Diuretics now on hold again.   Remains very weak with soft BPs in 80s. Non-essential and non-palliative meds have been d/c'd. Discussing home with hospice.   Objective:   Weight Range: 138 lb (62.6 kg) Body mass index is 20.98 kg/m.   Vital Signs:   Temp:  [98.2 F (36.8 C)-98.7 F (37.1 C)] 98.7 F (37.1 C) (08/07 0500) Pulse Rate:  [59-146] 59 (08/07 0500) Resp:  [14-26] 18 (08/07 0500) BP: (83-97)/(59-77) 83/59 (08/07 0500) SpO2:  [91 %-100 %] 100 % (08/07 0500) Weight:  [138 lb (62.6 kg)] 138 lb (62.6 kg) (08/07 0500) Last BM Date: 01/22/16  Weight change: Filed Weights   01/21/16 0500 01/22/16 0443 01/23/16 0500  Weight: 131 lb 8 oz (59.6 kg) 141 lb 14.4 oz (64.4 kg) 138 lb (62.6 kg)    Intake/Output:   Intake/Output Summary (Last 24 hours) at 01/23/16 0723 Last data filed at 01/23/16 0500  Gross per 24 hour  Intake              236 ml  Output              200 ml  Net               36 ml     Physical Exam: General:  Chronically ill and elderly appearing. Fatigued, pale.  HEENT: normal Neck: supple. JVD to jaw. Carotids 2+ bilat; no bruits. No thyromegaly or nodule noted. Cor: PMI laterally displaced. Regular. Tachy  No rubs. + S3; +3/6 systolic murmur.  Lungs: Diminished basilar sounds Abdomen: soft, NT, ND, no HSM. No bruits or masses. +BS  Extremities: no cyanosis, clubbing, rash, edema.  Left 3rd finger with dressing Neuro: alert & oriented to person. Confused at times cranial nerves grossly intact. moves all 4 extremities w/o difficulty. Affect flat..  Telemetry: OFF TELEMETRY   Labs: CBC  Recent Labs   01/21/16 0345 01/22/16 0353  WBC 7.9 9.5  NEUTROABS 6.3 8.0*  HGB 12.4* 12.5*  HCT 39.4 36.9*  MCV 82.3 79.9  PLT 124* 128*   Basic Metabolic Panel  Recent Labs  01/21/16 0345 01/22/16 0353  NA 136 137  K 3.4* 3.5  CL 105 105  CO2 21* 23  GLUCOSE 189* 128*  BUN 19 21*  CREATININE 1.18 1.47*  CALCIUM 8.1* 7.9*   Liver Function Tests No results for input(s): AST, ALT, ALKPHOS, BILITOT, PROT, ALBUMIN in the last 72 hours. No results for input(s): LIPASE, AMYLASE in the last 72 hours. Cardiac Enzymes No results for input(s): CKTOTAL, CKMB, CKMBINDEX, TROPONINI in the last 72 hours.  BNP: BNP (last 3 results)  Recent Labs  01/16/16 1036  BNP 626.2*    ProBNP (last 3 results) No results for input(s): PROBNP in the last 8760 hours.   D-Dimer No results for input(s): DDIMER in the last 72 hours. Hemoglobin A1C No results for input(s): HGBA1C in the last 72 hours. Fasting Lipid Panel No results for input(s): CHOL, HDL, LDLCALC, TRIG, CHOLHDL, LDLDIRECT in the last 72 hours. Thyroid Function Tests No results for input(s): TSH, T4TOTAL, T3FREE, THYROIDAB in the last 72 hours.  Invalid  input(s): FREET3  Other results:     Imaging/Studies:  No results found.  Latest Echo  Latest Cath   Medications:     Scheduled Medications: . clotrimazole   Topical BID  . feeding supplement (ENSURE ENLIVE)  237 mL Oral TID BM  . lidocaine  5 mL Intradermal Once  . nafcillin IV  2 g Intravenous Q4H  . potassium chloride  40 mEq Oral Daily  . rifampin  600 mg Oral Daily    Infusions: . milrinone 0.25 mcg/kg/min (01/22/16 1010)    PRN Medications: acetaminophen, guaiFENesin-dextromethorphan, ondansetron **OR** ondansetron (ZOFRAN) IV   Assessment   1. Bacteremia - BCx + with Staph Aureus 2. Acute on chronic systolic CHF 3. Hyperglycemic nonketotic state 4. ARF on CKD stage III 5. Paronychia; left middle finger  Plan    Remains very debilitated and  weak. Sleeping this am and somewhat difficult to rouse.   Volume status below baseline now with diarrhea. Renal function up slightly.   Continue milrinone and IV abx for now.    Prognosis very poor. He remains weak and seems to be declining overall.  Talking with daughter about home with Hospice.   Case management to work on today.  Will need to discuss what this means for his milrinone. Will need to deactivate ICD.   Length of Stay: [address]  Graciella Freer, [place] 01/23/2016, 7:23 AM  Advanced Heart Failure Team Pager [phone] (M-F; 7a - 4p)  Please contact [facility] for night-coverage after hours (4p -7a ) and weekends on [email or web address]   Patient seen and examined with Otilio Saber, PA-C. We discussed all aspects of the encounter. I agree with the assessment and plan as stated above.   He has done very well on milrinone for nearly 3 years (unbelievable). However he has had a significant functional decline recently. He was admitted with PICC line infection and AKI. Over past few days he has been confused and very weak and I felt he would likely need Hospice. However, he is much more awake today and states clearly that he wants to continue milrinone and he doesn't want Hospice or SNF. His daughter was at his bedside and is very willing to take him home and continue the excellent care she has provided him.   At this point would hold diuretics in setting of AKI. Replace PICC and prepare to send him home in the next 24-48 hours, if possible. I greatly appreciate the thoughtful care provided to him by the IM teaching service.   Gary Wiedeman,MD 10:00 PM

## 2016-01-23 NOTE — Progress Notes (Signed)
Subjective: This morning, pt is very lethargic and difficult to arouse. Daughter [name] was present and discussed options for palliative care and hospice. She raised questions about milrinone and stopping other medications. She was concerned about "giving up" and we addressed her questions regarding her father's prognosis and options. We deferred questions about milrinone to heart failure team.  Objective: Vital signs in last 24 hours: Vitals:   01/22/16 1432 01/22/16 1945 01/22/16 2018 01/23/16 0500  BP: (!) 85/74 97/77  (!) 83/59  Pulse: (!) 146   (!) 59  Resp:  (!) 26  18  Temp: 98.6 F (37 C)  98.2 F (36.8 C) 98.7 F (37.1 C)  TempSrc: Oral  Oral   SpO2: 91%  97% 100%  Weight:    138 lb (62.6 kg)  Height:       Wt: +18 lbs since admission I/O: 08/06 0701 - 08/07 0700 In: 236 [P.O.:236] Out: 200 [Urine:200]  Physical Exam: Physical Exam  Constitutional: He appears lethargic.  Cardiovascular: Regular rhythm.  Tachycardia present.   Pulmonary/Chest: Breath sounds normal.  Neurological: He appears lethargic.   Labs: Metabolic Panel:  Recent Labs Lab 01/16/16 1036  01/18/16 0425 01/19/16 0328 01/20/16 0342 01/21/16 0345 01/22/16 0353  NA 135  < > 142 143 139 136 137  K 4.3  < > 3.0* 3.6 3.7 3.4* 3.5  CL 91*  < > 106 108 109 105 105  CO2 25  < > 21* 23  GLUCOSE 528*  < > 115* 82 194* 189* 128*  BUN 105*  < > 64* 40* 26* 19 21*  CREATININE 3.67*  < > 1.77* 1.34* 1.19 1.18 1.47*  CALCIUM 9.8  < > 8.2* 8.5* 8.1* 8.1* 7.9*  ALT 20  --   --   --   --   --   --   ALKPHOS 169*  --   --   --   --   --   --   BILITOT 5.2*  --   --   --   --   --   --   PROT 8.4*  --   --   --   --   --   --   ALBUMIN 3.7  --   --   --   --   --   --   LIPASE 16  --   --   --   --   --   --   LABPROT 17.1*  --   --   --   --   --   --   INR 1.38  --   --   --   --   --   --   < > = values in this interval not displayed. BG:  Recent Labs Lab 01/22/16 0730 01/22/16 1117  01/22/16 1632 01/22/16 1705 01/22/16 1723  GLUCAP 83 95 58* 64* 92    Lab Results  Component Value Date   HGBA1C 8.7 07/20/2015   Microbiology: BCx 1/2 growing MSSA, repeat BCx NGx2d  Imaging: None    Medications: Infusions: . milrinone 0.25 mcg/kg/min (01/22/16 1010)    Scheduled Medications: . clotrimazole   Topical BID  . feeding supplement (ENSURE ENLIVE)  237 mL Oral TID BM  . lidocaine  5 mL Intradermal Once  . nafcillin IV  2 g Intravenous Q4H  . potassium chloride  40 mEq Oral Daily  . rifampin  600 mg Oral Daily   Assessment/Plan: Pt  is a 70 y.o. yo male with end-stage CHF who was admitted on 01/16/2016 with symptoms of oliguria, hyperglycemia, and AMS which was determined to be secondary to HHS, dehydration, and bacteremia now found to have signs of oliguric kidney injury due to cardiogenic shock.  Oliguric acute kidney injury secondary to cardiogenic shock: 2/2 end-stage CHF. Urine output 525cc all day today which is less than 5mg /kg/hr and bump in Crt today from 1.2 to 1.5. Given his poor prognosis, we will shift our focus to prioritze his comfort and alleviate any suffering that he may experience in his time remaining with [place].  -Consult case management to set up home hospice -Discontinue all lab draws -Discontinue DVT prophylaxis -Discontinue telemetry monitoring -Discontinue non-palliative meds -DC to home hospice  MSSA bacteremia: BCx 1/2 + for MSSA. Repeat BCx w/ NGx2D. Continue IV nafcillin and rifampin for now but will discontinue prior to discharge.Marland Kitchen  DMII: Discontinue sliding scale insulin.  DVT PPx: Discontinue heparin  Length of Stay: 7 day(s)  Dispo: Anticipated discharge today to home hospice.  Carolynn Comment, MD  Pager: [phone] (7AM-5PM) 01/23/2016, 6:58 AM

## 2016-01-24 ENCOUNTER — Inpatient Hospital Stay (HOSPITAL_COMMUNITY): Payer: Medicare HMO

## 2016-01-24 ENCOUNTER — Encounter (HOSPITAL_COMMUNITY): Payer: Self-pay | Admitting: Interventional Radiology

## 2016-01-24 DIAGNOSIS — Y712 Prosthetic and other implants, materials and accessory cardiovascular devices associated with adverse incidents: Secondary | ICD-10-CM

## 2016-01-24 DIAGNOSIS — N179 Acute kidney failure, unspecified: Secondary | ICD-10-CM

## 2016-01-24 HISTORY — PX: IR GENERIC HISTORICAL: IMG1180011

## 2016-01-24 LAB — GLUCOSE, CAPILLARY
GLUCOSE-CAPILLARY: 153 mg/dL — AB (ref 65–99)
Glucose-Capillary: 129 mg/dL — ABNORMAL HIGH (ref 65–99)

## 2016-01-24 LAB — BASIC METABOLIC PANEL
Anion gap: 11 (ref 5–15)
BUN: 22 mg/dL — AB (ref 6–20)
CHLORIDE: 101 mmol/L (ref 101–111)
CO2: 21 mmol/L — AB (ref 22–32)
CREATININE: 2.34 mg/dL — AB (ref 0.61–1.24)
Calcium: 7.7 mg/dL — ABNORMAL LOW (ref 8.9–10.3)
GFR calc Af Amer: 31 mL/min — ABNORMAL LOW (ref >60)
GFR calc non Af Amer: 27 mL/min — ABNORMAL LOW (ref >60)
Glucose, Bld: 122 mg/dL — ABNORMAL HIGH (ref 65–99)
Potassium: 3.2 mmol/L — ABNORMAL LOW (ref 3.5–5.1)
SODIUM: 133 mmol/L — AB (ref 135–145)

## 2016-01-24 MED ORDER — SERTRALINE HCL 50 MG PO TABS
50.0000 mg | ORAL_TABLET | Freq: Every day | ORAL | Status: DC
  Administered 2016-01-24: 50 mg via ORAL
  Filled 2016-01-24: qty 1

## 2016-01-24 MED ORDER — POTASSIUM CHLORIDE 20 MEQ/15ML (10%) PO SOLN
40.0000 meq | Freq: Once | ORAL | Status: AC
  Administered 2016-01-24: 40 meq via ORAL
  Filled 2016-01-24: qty 30

## 2016-01-24 MED ORDER — TORSEMIDE 20 MG PO TABS: ORAL_TABLET | ORAL | 6 refills | Status: AC

## 2016-01-24 MED ORDER — SERTRALINE HCL 50 MG PO TABS: 50.0000 mg | ORAL_TABLET | Freq: Every day | ORAL | 2 refills | Status: AC

## 2016-01-24 MED ORDER — POTASSIUM CHLORIDE CRYS ER 20 MEQ PO TBCR: EXTENDED_RELEASE_TABLET | ORAL | Status: AC

## 2016-01-24 MED ORDER — NAFCILLIN SODIUM 2 G IJ SOLR: 2.0000 g | INTRAVENOUS | 0 refills | Status: AC

## 2016-01-24 MED ORDER — LIDOCAINE HCL 1 % IJ SOLN
INTRAMUSCULAR | Status: DC | PRN
  Administered 2016-01-24: 10 mL

## 2016-01-24 MED ORDER — RIFAMPIN 300 MG PO CAPS: 600.0000 mg | ORAL_CAPSULE | Freq: Every day | ORAL | 0 refills | Status: DC

## 2016-01-24 MED ORDER — LOPERAMIDE HCL 1 MG/5ML PO LIQD
4.0000 mg | ORAL | Status: DC | PRN
  Filled 2016-01-24: qty 20

## 2016-01-24 MED ORDER — LIDOCAINE HCL 1 % IJ SOLN
INTRAMUSCULAR | Status: AC
  Filled 2016-01-24: qty 20

## 2016-01-24 MED ORDER — SODIUM CHLORIDE 0.9 % IV BOLUS (SEPSIS)
500.0000 mL | Freq: Once | INTRAVENOUS | Status: AC
  Administered 2016-01-24: 500 mL via INTRAVENOUS

## 2016-01-24 MED ORDER — POTASSIUM CHLORIDE CRYS ER 20 MEQ PO TBCR
40.0000 meq | EXTENDED_RELEASE_TABLET | Freq: Two times a day (BID) | ORAL | Status: DC
  Administered 2016-01-24: 40 meq via ORAL
  Filled 2016-01-24: qty 2

## 2016-01-24 NOTE — Progress Notes (Addendum)
Creatinine 1.4 -> 2.34.  Discussed with Dr. [name] . Neck veins down. Will give 500 cc fluid.    K 3.2. Supp ordered.     Casimiro Needle [address]" [place], PA-C 01/24/2016 11:19 AM    Agree.  Bensimhon, Daniel,MD 9:53 PM

## 2016-01-24 NOTE — Progress Notes (Signed)
Advanced Heart Failure Rounding Note  Referring Provider: Dr. Lynelle Doctor Primary Physician:  Pincus Badder, MD Primary Cardiologist:  Dr. [name]   Reason for Admission: A/C systolic HF  Subjective:    PICC out. Remains on abx.  Repeat BCx 01/20/16 remain negative to date.    More awake this am.  Wants to go home.   Only 200 cc of UO total yesterday. BMET pending for today.   Objective:   Weight Range: 146 lb 3.2 oz (66.3 kg) Body mass index is 22.23 kg/m.   Vital Signs:   Temp:  [98.5 F (36.9 C)-99.3 F (37.4 C)] 99.3 F (37.4 C) (08/08 0300) Pulse Rate:  [90-122] 122 (08/08 0300) Resp:  [16-20] 16 (08/08 0300) BP: (89-106)/(61-69) 104/65 (08/08 0300) SpO2:  [93 %-98 %] 96 % (08/08 0300) Weight:  [146 lb 3.2 oz (66.3 kg)] 146 lb 3.2 oz (66.3 kg) (08/08 0300) Last BM Date: 01/23/16  Weight change: Filed Weights   01/22/16 0443 01/23/16 0500 01/24/16 0300  Weight: 141 lb 14.4 oz (64.4 kg) 138 lb (62.6 kg) 146 lb 3.2 oz (66.3 kg)    Intake/Output:   Intake/Output Summary (Last 24 hours) at 01/24/16 6237 Last data filed at 01/24/16 0245  Gross per 24 hour  Intake              540 ml  Output              350 ml  Net              190 ml     Physical Exam: General:  Chronically ill and elderly appearing. Fatigued, pale.  HEENT: normal Neck: supple. JVD 7-8. Carotids 2+ bilat; no bruits. No thyromegaly or nodule noted. Cor: PMI laterally displaced. Regular. Tachy  No rubs. + S3; +3/6 systolic murmur.  Lungs: Diminished throughout. Abdomen: soft, non-tender, non-distended, no HSM. No bruits or masses. +BS  Extremities: no cyanosis, clubbing, rash, edema.  Left 3rd finger with dressing Neuro: alert & oriented to person. Confused at times cranial nerves grossly intact. moves all 4 extremities w/o difficulty. Affect flat..  Telemetry: OFF TELEMETRY   Labs: CBC  Recent Labs  01/22/16 0353  WBC 9.5  NEUTROABS 8.0*  HGB 12.5*  HCT 36.9*  MCV 79.9  PLT  128*   Basic Metabolic Panel  Recent Labs  01/22/16 0353  NA 137  K 3.5  CL 105  CO2 23  GLUCOSE 128*  BUN 21*  CREATININE 1.47*  CALCIUM 7.9*   Liver Function Tests No results for input(s): AST, ALT, ALKPHOS, BILITOT, PROT, ALBUMIN in the last 72 hours. No results for input(s): LIPASE, AMYLASE in the last 72 hours. Cardiac Enzymes No results for input(s): CKTOTAL, CKMB, CKMBINDEX, TROPONINI in the last 72 hours.  BNP: BNP (last 3 results)  Recent Labs  01/16/16 1036  BNP 626.2*    ProBNP (last 3 results) No results for input(s): PROBNP in the last 8760 hours.   D-Dimer No results for input(s): DDIMER in the last 72 hours. Hemoglobin A1C No results for input(s): HGBA1C in the last 72 hours. Fasting Lipid Panel No results for input(s): CHOL, HDL, LDLCALC, TRIG, CHOLHDL, LDLDIRECT in the last 72 hours. Thyroid Function Tests No results for input(s): TSH, T4TOTAL, T3FREE, THYROIDAB in the last 72 hours.  Invalid input(s): FREET3  Other results:     Imaging/Studies:  No results found.  Latest Echo  Latest Cath   Medications:     Scheduled Medications: .  clotrimazole   Topical BID  . feeding supplement (ENSURE ENLIVE)  237 mL Oral TID BM  . lidocaine  5 mL Intradermal Once  . nafcillin IV  2 g Intravenous Q4H  . rifampin  600 mg Oral Daily    Infusions: . milrinone 0.25 mcg/kg/min (01/23/16 1800)    PRN Medications: acetaminophen, guaiFENesin-dextromethorphan, ondansetron **OR** ondansetron (ZOFRAN) IV   Assessment   1. Bacteremia - BCx + with Staph Aureus 2. Acute on chronic systolic CHF 3. Hyperglycemic nonketotic state 4. ARF on CKD stage III 5. Paronychia; left middle finger  Plan    Continue milrinone and IV abx for now.  Will place order to have PICC line replaced to today.  Plan is to go home at least temporarily on milrinone. In discussions with case management, Hospice is OK with 2 weeks of milrinone. Will need to further  discuss with family their wishes.   Prognosis very poor. Goals of care discussion on going.  To discuss status of ICD today.    Home likely today vs tomorrow pending placement of PICC line.    He will not need scheduled torsemide at this time.  Should take weight when he  gets home, and take 40 mg torsemide with 40 meq of potassium AS NEEDED for weight gain of 5 lbs.   Length of Stay: [address]  Luane School 01/24/2016, 8:12 AM  Advanced Heart Failure Team Pager [phone] (M-F; 7a - 4p)  Please contact [facility] for night-coverage after hours (4p -7a ) and weekends on [email or web address]  Patient seen and examined with Otilio Saber, PA-C. We discussed all aspects of the encounter. I agree with the assessment and plan as stated above.   He says he feels ok. However renal function worse today. Will hold diuretics and give 500cc bolus. Long talk with him and his daughter yesterday and they are adamant that they want to continue milrinone for now and see how he does. They realize prognosis is likely quite poor. However, they are mot interested in SNF or Hospice. Want to keep ICD on for now. I have discussed this with IMTS as well. Very difficult situation.   Will continue milrinone for now. Hold diuretics and have PICC replaced. Once that is accomplished he can go home on IV abx and milrinone. We will follow closely in [facility] Clinic and continue to address his situation and provide support. If renal function continues to worsen will need to switch to comfort care.   [place],MD 9:53 PM

## 2016-01-24 NOTE — Progress Notes (Signed)
   Subjective: This morning, the patient is alert and conversing with the team. Daughter is at the bedside. We spoke at length about the pt's disposition in terms of antibiotics and the daughter and patient agree to attempt the antibiotics at home w/ the help of Hospice for 2 weeks. They are aware of the risk of skin break-down and infection as a result of the antibiotic induced diarrhea, and want to attempt home care for this. They will continue to follow with the heart failure team for assistance with milrinone infusion and ICD. Hospice will assist them with palliative needs.  Objective: Vital signs in last 24 hours: Vitals:   01/23/16 0800 01/23/16 1350 01/23/16 2117 01/24/16 0300  BP:  106/69 (!) 89/61 104/65  Pulse:  90 (!) 122 (!) 122  Resp: 17 20 18 16   Temp:  98.9 F (37.2 C) 98.5 F (36.9 C) 99.3 F (37.4 C)  TempSrc:  Axillary Oral Oral  SpO2: 95% 93% 98% 96%  Weight:    146 lb 3.2 oz (66.3 kg)  Height:       Wt: +32 lbs since admission I/O: 08/07 0701 - 08/08 0700 In: 660 [P.O.:360; IV Piggyback:300] Out: 350 [Urine:200; Stool:150]  Physical Exam: Physical Exam  Constitutional: He is cooperative. He has a sickly appearance.  Cardiovascular: Regular rhythm.  Tachycardia present.   Pulmonary/Chest: Breath sounds normal.  Neurological: He is alert.   Labs: Metabolic Panel:  Recent Labs Lab 01/18/16 0425 01/19/16 0328 01/20/16 0342 01/21/16 0345 01/22/16 0353  NA 142 143 139 136 137  K 3.0* 3.6 3.7 3.4* 3.5  CL 106 108 109 105 105  CO2 27 26 23  21* 23  GLUCOSE 115* 82 194* 189* 128*  BUN 64* 40* 26* 19 21*  CREATININE 1.77* 1.34* 1.19 1.18 1.47*  CALCIUM 8.2* 8.5* 8.1* 8.1* 7.9*   BG:  Recent Labs Lab 01/22/16 1632 01/22/16 1705 01/22/16 1723 01/23/16 2033 01/24/16 0722  GLUCAP 58* 64* 92 130* 129*    Lab Results  Component Value Date   HGBA1C 8.7 07/20/2015   Microbiology: BCx 1/2 growing MSSA, repeat BCx NGx3d C.Dif negative    Medications: Infusions: . milrinone 0.25 mcg/kg/min (01/23/16 1800)   Scheduled Medications: . clotrimazole   Topical BID  . feeding supplement (ENSURE ENLIVE)  237 mL Oral TID BM  . lidocaine  5 mL Intradermal Once  . nafcillin IV  2 g Intravenous Q4H  . rifampin  600 mg Oral Daily   Assessment/Plan: Pt is a 70 y.o. yo male with end-stage CHF who was admitted on 01/16/2016 with symptoms of oliguria, hyperglycemia, and AMS which was determined to be secondary to HHS, dehydration, and bacteremia now found to have signs of oliguric kidney injury due to cardiogenic shock.  Oliguric acute kidney injury secondary to cardiogenic shock: 2/2 end-stage CHF. UOP continues to decline w/ ~200cc. -Consult case management to set up home hospice, pt to pursue trial for 2 weeks while continuing milrinone and abx, will reassess hospice care after that time  MSSA bacteremia: BCx 1/2 + for MSSA. Repeat BCx w/ NGx3D. Continue IV nafcillin and rifampin. Will plan 6 weeks pending tolerance of GI side-effects, may discontinue for palliative care if family decides.  Depression: will continue Zoloft for mood  DMII: Discontinue sliding scale insulin.  DVT PPx: Discontinue heparin  Length of Stay: 8 day(s)  Dispo: Anticipated discharge today to home hospice.  Carolynn Comment, MD  Pager: [phone] (7AM-5PM) 01/24/2016, 8:23 AM

## 2016-01-24 NOTE — Plan of Care (Signed)
Problem: Health Behavior/Discharge Planning: Goal: Ability to manage health-related needs will improve Outcome: Progressing Assess for Home Health needs at discharge.

## 2016-01-24 NOTE — Progress Notes (Signed)
  Date: 01/24/2016  Patient name: Jermie Schreib  Medical record number: [id number]  Date of birth: 1945-07-22   This patient has been seen and the plan of care was discussed with the house staff. Please see their note for complete details. I concur with their findings with the following additions/corrections: Drs Allena Katz, Marinda Elk, and I spend over 30 min at the bedside, face to face with Mr Barsamian and his daughter discussing the pt's d/c, GOC, and F/U plans.   1. Dr B and his team to direct milrinone, ICD, and fluid status (inc IVF and diuretics). The pt will go home on milrinone and ICD active.  2. We discussed IV and PO ABX. We explained there wasn't any good option. The IV ABX is causing sig D - to the point of needing a rectal tube. The D, the rectal tube, and just the ABX themselves are causing sig pain, irritation, burning, and skin breakdown in his perineum. He will need IV ABx for a total of a 6 week course. We explained the D will cont for the entire course and then PO ABX were rec and the D is likely to cont on the Po ABX. We explained he is at high risk for further skin breakdown and superficial bacterial infxn. The IV ABX followed by PO ABX is necessary to tx the bacteremia in light that he has an ICD which was not removed. The other bad option is to stop the ABX to allow the diarrhea and subsequent sxs to resolve. However, there would be a chance that he then bc bacteremic again. Mr Whitaker expressed at least twice how much pain he is in from the D but deferred to his daughter. She prefers to keep the ABX for now and see how he does at home.  3. We also discussed hospice. The daughter is adamant that he cont all of his meds inc his milrinone. She is under the impression that [facility] will allow milrinone for 2 weeks. At the end of the 2 weeks, she will decide whether to stop the milrinone and cont with hospice or dismiss hospice and cont the milrinone. We will ask care mgmt to  arrange hospice.  4. Local care in [facility] -pt and daughter want to travel back to [place] to cont to see dr B with HF. They do not have a local [facility] PCP. We stressed that they need to est with a local PCP and I suggested they ask hospice for a rec.   The outlook is poor. Team is very concerned that he will be D/C'd to [facility] and decompensate - either from HF, vol status, electrolyte abnl, renal failure and uremia, skin breakdown, or any number of other possibilities. He is at extremely high readmission risk and I would not be surprised if he gets readmitted within the next week. Unfortunately, the admission will be at an outlying facility with a new team unfamiliar with the pt.    Burns Spain, MD 01/24/2016, 1:31 PM

## 2016-01-24 NOTE — Procedures (Signed)
RIJV tunelled PICC 23 cm SVC RA No comp/EBL

## 2016-01-25 LAB — CULTURE, BLOOD (ROUTINE X 2)
CULTURE: NO GROWTH
CULTURE: NO GROWTH

## 2016-01-27 ENCOUNTER — Other Ambulatory Visit (HOSPITAL_COMMUNITY): Payer: Self-pay | Admitting: Internal Medicine

## 2016-02-02 ENCOUNTER — Inpatient Hospital Stay (HOSPITAL_COMMUNITY): Admit: 2016-02-02 | Payer: Medicare HMO

## 2016-02-02 ENCOUNTER — Ambulatory Visit (HOSPITAL_COMMUNITY)
Admission: RE | Admit: 2016-02-02 | Discharge: 2016-02-02 | Disposition: A | Discharge status: Home / Self Care | Payer: Medicare Other | Source: Ambulatory Visit | Attending: Cardiology | Admitting: Cardiology

## 2016-02-02 VITALS — BP 82/54 | HR 115 | Wt 136.0 lb

## 2016-02-02 DIAGNOSIS — E785 Hyperlipidemia, unspecified: Secondary | ICD-10-CM | POA: Insufficient documentation

## 2016-02-02 DIAGNOSIS — T827XXD Infection and inflammatory reaction due to other cardiac and vascular devices, implants and grafts, subsequent encounter: Secondary | ICD-10-CM

## 2016-02-02 DIAGNOSIS — I5022 Chronic systolic (congestive) heart failure: Secondary | ICD-10-CM | POA: Diagnosis present

## 2016-02-02 DIAGNOSIS — I428 Other cardiomyopathies: Secondary | ICD-10-CM | POA: Diagnosis not present

## 2016-02-02 DIAGNOSIS — F329 Major depressive disorder, single episode, unspecified: Secondary | ICD-10-CM | POA: Insufficient documentation

## 2016-02-02 DIAGNOSIS — Z95810 Presence of automatic (implantable) cardiac defibrillator: Secondary | ICD-10-CM | POA: Diagnosis not present

## 2016-02-02 DIAGNOSIS — R7881 Bacteremia: Secondary | ICD-10-CM | POA: Diagnosis not present

## 2016-02-02 DIAGNOSIS — I110 Hypertensive heart disease with heart failure: Secondary | ICD-10-CM | POA: Insufficient documentation

## 2016-02-02 DIAGNOSIS — Z66 Do not resuscitate: Secondary | ICD-10-CM | POA: Diagnosis not present

## 2016-02-02 DIAGNOSIS — R000 Tachycardia, unspecified: Secondary | ICD-10-CM | POA: Insufficient documentation

## 2016-02-02 DIAGNOSIS — H548 Legal blindness, as defined in USA: Secondary | ICD-10-CM | POA: Diagnosis not present

## 2016-02-02 DIAGNOSIS — K219 Gastro-esophageal reflux disease without esophagitis: Secondary | ICD-10-CM | POA: Diagnosis not present

## 2016-02-02 DIAGNOSIS — H269 Unspecified cataract: Secondary | ICD-10-CM | POA: Diagnosis not present

## 2016-02-02 DIAGNOSIS — E11319 Type 2 diabetes mellitus with unspecified diabetic retinopathy without macular edema: Secondary | ICD-10-CM | POA: Insufficient documentation

## 2016-02-02 MED ORDER — RIFAMPIN 300 MG PO CAPS: 600.0000 mg | ORAL_CAPSULE | Freq: Every day | ORAL | 0 refills | Status: AC

## 2016-02-02 NOTE — Addendum Note (Signed)
Encounter addended by: Marcy Siren, LCSW on: 02/02/2016  4:02 PM<BR>    Actions taken: Sign clinical note

## 2016-02-02 NOTE — Patient Instructions (Signed)
Take torsemide today and tomorrow.  Restart Rifampin 2 capsules once daily for 30 days (until bottle empty).

## 2016-02-02 NOTE — Progress Notes (Signed)
Patient ID: Gary Gallegos, male   DOB: Jul 15, 1945, 70 y.o.   MRN: [id number]   PCP: Dr. Worthy RancherPadonda Webb, FNP Primary Cardiologist: Dr. Tobias AlexanderKatarina Gallegos Primary HF Cardiologist: Dr Gary Gallegos.   HPI: Gary Gallegos is a 70 yo male referred to the HF clinic by Dr.Nelson. He has a history of DM2 with severe diabetic retinopathy legally blind September , cataracts HTN, HLD, chronic systolic HF due to severe, EF 40%10% (11/14) s/p ICD [employer].   Started having problems with his heart in early 2013. [facility]. Had cath in 2013 and arteries were "fine".  [facility] with N/V/D. EGD  Showed severe gastritis and esophagitis. Started on Milrinone 0.25 mcg via PICC. Discharge weight 133 pounds. [facility]   Admitted 8/2 through 8/6 /2016 with volume overload. Diuresed with IV lasix and milrinone was increased to 0.375 mcg. Transitioned to torsemide 80 mg daily. Discharge weight was 137 pounds.  Admitted 7/31 through 01/24/2016 with end stage heart failure and bacteremia. PICC was removed but later replaced. ID followed and he was discharged on antibiotics. He was not discharged on diuretics. Discharge weight was   He presents today for HF follow up with his daughter. Gary Gallegos tearful about her Dad. Weight at home 130 pounds. Complaining of a cough. Productive cough with white sputum. + Orthopnea. SOB with exertion. Not OOB much.  Needs assistance with ADLs. Followed by Gary Gallegos.  He continues on milrinone.    ECHO 05/05/13: EF 10%, diff HK, grade II DD, mild MR, LA mod dilated, RV mildly dilated and sys fx mod reduced, mod TR ECHO 11/04/13  EF 10% mod-severe MR RV mod reduced. TAPSE 1.6  ECHO 01/2015 EF 10-15%   Labs 07/15/13 K 5.7 Creatinine 1.95 instructed to cut lisinopril 5 mg daily.  Labs 07/22/13 K 3.6 Creatinine 0.81 Dig 1.9 digoxin stopped Labs  08/17/13 K 3.7 Creatinine 0.8 Glucose 190           08/20/13: K+ 3.7, Creatinine 0.70, Glucose 123, Na  137           10/22/13: K 3.9, creatinine 0.69, glucose 179           02/11/14: K 4.3 cr 1.2           07/05/2014: K 5.4 Creatinine 0.96 potassium cut back to 20 meq once a day.            07/12/2014: K 4.4 Creatinine 0.95             11/09/2014: K 3.6 Creatinine 1.04             6/16: K 3.9, creatinine 1.01, hgb 11.9             12/29/14: K 3.9 Creatinine 1.19              01/22/2015: K 4.4 Creatinine 0.89              05/10/2015: K 4.3 Creatinine 1.04              08/23/2015; K 3.7 Creatinine 0.65   SH: Lives with his daughter, son in law, and 2 granddaughters.     ROS: All systems negative except as listed in HPI, PMH and Problem List.  Past Medical History:  Diagnosis Date  . AICD (automatic cardioverter/defibrillator) present   . Biventricular CHF (congestive heart failure) (HCC)    a. Echo 8/16:  EF 10-15%, diff HK, trivial AI, mild MR, mild  LAE, mod RVE, mild reduced RVF, mild TR, PASP 40 mmHg, mild eff without tamponade.   . Blind left eye   . Depression   . GERD (gastroesophageal reflux disease)   . Hyperlipidemia   . Hypertension   . Impaired vision in both eyes   . NICM (nonischemic cardiomyopathy) (HCC)   . Type II diabetes mellitus (HCC)     Current Outpatient Prescriptions  Medication Sig Dispense Refill  . milrinone (PRIMACOR) 20 MG/100ML SOLN infusion Inject 25.875 mcg/min into the vein continuous. 100 mL 999  . nafcillin 2 g in dextrose 5 % 100 mL Inject 2 g into the vein every 4 (four) hours. 222 Dose 0  . potassium chloride SA (K-DUR,KLOR-CON) 20 MEQ tablet Take 40 meq (2 tablets) ONLY ON DAYS HE TAKES TORSEMIDE.    Marland Kitchen sertraline (ZOLOFT) 50 MG tablet Take 1 tablet (50 mg total) by mouth daily. 30 tablet 2  . torsemide (DEMADEX) 20 MG tablet Take 40 mg (2 tabs) AS NEEDED for weight gain of 5 lbs from baseline weight. 60 tablet 6   No current facility-administered medications for this encounter.     Vitals:   02/02/16 1132  BP: (!) 82/54  Pulse: (!) 115  SpO2: 98%   Weight: 136 lb (61.7 kg)    PHYSICAL EXAM: General: Chronically ill appearing. Coughing during visit  Arrived in a wheel chair. Sister present. Marland Kitchen  HEENT: normal; arcus senilus.  Neck: supple. JVP ~10 . Carotids 2+ bilaterally; no bruits. No lymphadenopathy or thryomegaly appreciated. Cor: PMI laterally displaced. Regular. No rubs. + S3; +3/6 systolic murmur  Tunneled PICC in R upper chest. Dressing CDI Lungs: RLL LLL crackles.  Abdomen:  nontender, nondistended. No hepatosplenomegaly. No bruits or masses. Good bowel sounds. Extremities: no cyanosis, clubbing, rash; R and L trace edema.    Neuro: alert & orientedx3, cranial nerves grossly intact. Moves all 4 extremities w/o difficulty. Affect pleasant.  ASSESSMENT & PLAN: 1) End Stage Chronic biventricular HF: NICM s/p ICD. EF 10% mod-severe MR RV mod reduced. He is on chronic home milrinone.  NYHA class IIIb. Functional decline. Requiring assistance with all ADLs. Continue milrinone for now.  Mild volume overload noted. Instructed to take 20 mg torsemide today and tomorrow.  No BB with low output and hypotension.  - No ACEI with soft bp.  2)  Diabetes with diabetic retinopathy-   3) Sinus Tachycardia- Not candidate for ivabradine due to shock/milrinone.  4) Bacteremia- MSSA . On nafcillin and rifampin. Has follow up with ID.  5) DNR  Continue [facility]. Continue Milrinone for now. Referred to to HFSW for coping strategies for his daughter.   Follow up in 4 weeks.   Gary Garrow NP-C  11:36 AM

## 2016-02-02 NOTE — Progress Notes (Signed)
CSW met with patient, daughter and 2 granddaughters in the clinic. Patient is currently on hospice and daughter became tearful during visit. Daughter states she is realizing the path they are on and "it's hard". CSW provided supportive intervention and discussed hospice services available to family in the home environment for added supportive counseling. CSW will continue to be available as needed. Raquel Sarna, LCSW [phone]

## 2016-02-02 NOTE — Progress Notes (Signed)
Advanced Heart Failure Medication Review by a Pharmacist  Does the patient  feel that his/her medications are working for him/her?  yes  Has the patient been experiencing any side effects to the medications prescribed?  no  Does the patient measure his/her own blood pressure or blood glucose at home?  no   Does the patient have any problems obtaining medications due to transportation or finances?   no  Understanding of regimen: fair Understanding of indications: fair Potential of compliance: fair Patient understands to avoid NSAIDs. Patient understands to avoid decongestants.  Issues to address at subsequent visits: None   Pharmacist comments:  Gary Gallegos is a pleasant 70 yo M presenting with some family members. They report good compliance with his regimen but he has not needed any torsemide recently. They also state that he is not taking rifampin because they did not know that he should be taking it.   Gary Gallegos. Gary Gallegos, PharmD, BCPS, CPP Clinical Pharmacist Pager: [phone] Phone: [phone] 02/02/2016 11:32 AM      Time with patient: 8 minutes Preparation and documentation time: 2 minutes Total time: 10 minutes

## 2016-02-28 ENCOUNTER — Inpatient Hospital Stay: Payer: Medicare HMO | Admitting: Infectious Disease

## 2016-03-01 ENCOUNTER — Inpatient Hospital Stay (HOSPITAL_COMMUNITY): Admission: RE | Admit: 2016-03-01 | Payer: Medicare HMO | Source: Ambulatory Visit

## 2016-03-01 ENCOUNTER — Encounter (HOSPITAL_COMMUNITY): Payer: Medicare HMO

## 2016-03-05 ENCOUNTER — Encounter (HOSPITAL_COMMUNITY): Payer: Self-pay

## 2016-03-05 NOTE — Progress Notes (Signed)
Medical Record request for docs 10/2015-present faxed to provided # [phone] for supporting documentation of home health oxygen. Copy of request scanned into patient's electronic medical record.  Ave Filter, RN

## 2016-03-13 ENCOUNTER — Encounter (HOSPITAL_COMMUNITY): Payer: Medicare HMO

## 2016-04-12 ENCOUNTER — Encounter: Payer: Self-pay | Admitting: Internal Medicine

## 2016-04-12 ENCOUNTER — Ambulatory Visit (HOSPITAL_COMMUNITY)
Admission: RE | Admit: 2016-04-12 | Discharge: 2016-04-12 | Disposition: A | Discharge status: Home / Self Care | Payer: Medicare Other | Source: Ambulatory Visit | Attending: Cardiology | Admitting: Cardiology

## 2016-04-12 VITALS — BP 100/78 | HR 115 | Wt 132.0 lb

## 2016-04-12 DIAGNOSIS — I429 Cardiomyopathy, unspecified: Secondary | ICD-10-CM | POA: Diagnosis not present

## 2016-04-12 DIAGNOSIS — R000 Tachycardia, unspecified: Secondary | ICD-10-CM | POA: Diagnosis not present

## 2016-04-12 DIAGNOSIS — I5022 Chronic systolic (congestive) heart failure: Secondary | ICD-10-CM

## 2016-04-12 DIAGNOSIS — F329 Major depressive disorder, single episode, unspecified: Secondary | ICD-10-CM | POA: Diagnosis not present

## 2016-04-12 DIAGNOSIS — Z4502 Encounter for adjustment and management of automatic implantable cardiac defibrillator: Secondary | ICD-10-CM

## 2016-04-12 DIAGNOSIS — Z66 Do not resuscitate: Secondary | ICD-10-CM | POA: Insufficient documentation

## 2016-04-12 DIAGNOSIS — I5082 Biventricular heart failure: Secondary | ICD-10-CM | POA: Insufficient documentation

## 2016-04-12 DIAGNOSIS — I472 Ventricular tachycardia, unspecified: Secondary | ICD-10-CM

## 2016-04-12 DIAGNOSIS — I110 Hypertensive heart disease with heart failure: Secondary | ICD-10-CM | POA: Insufficient documentation

## 2016-04-12 DIAGNOSIS — E11319 Type 2 diabetes mellitus with unspecified diabetic retinopathy without macular edema: Secondary | ICD-10-CM | POA: Insufficient documentation

## 2016-04-12 DIAGNOSIS — I5084 End stage heart failure: Secondary | ICD-10-CM | POA: Insufficient documentation

## 2016-04-12 DIAGNOSIS — H5462 Unqualified visual loss, left eye, normal vision right eye: Secondary | ICD-10-CM | POA: Insufficient documentation

## 2016-04-12 DIAGNOSIS — E785 Hyperlipidemia, unspecified: Secondary | ICD-10-CM | POA: Diagnosis not present

## 2016-04-12 DIAGNOSIS — K219 Gastro-esophageal reflux disease without esophagitis: Secondary | ICD-10-CM | POA: Diagnosis not present

## 2016-04-12 DIAGNOSIS — Z95810 Presence of automatic (implantable) cardiac defibrillator: Secondary | ICD-10-CM | POA: Diagnosis not present

## 2016-04-12 NOTE — Progress Notes (Signed)
Patient ID: Gary Gallegos, male   DOB: 1945/06/27, 70 y.o.   MRN: [id number]   PCP: Gary. Dutch Quint, FNP Primary Cardiologist: Gary. Ena Gallegos Primary HF Cardiologist: Gary Gary Gallegos.   HPI: Gary Gallegos is a 70 yo male referred to the HF clinic by Gary Gallegos. He has a history of DM2 with severe diabetic retinopathy legally blind September , cataracts HTN, HLD, chronic systolic HF due to severe, EF 10% (11/14) s/p ICD Medtronic.   Started having problems with his heart in early 2013. Was followed by cardiologist at [facility]. Had cath in 2013 and arteries were "fine".  Admitted to [facility] in January with N/V/D. EGD  Showed severe gastritis and esophagitis. Started on Milrinone 0.25 mcg via PICC. Discharge weight 133 pounds. Followed by Gary Gallegos.   Admitted 8/2 through 8/6 /2016 with volume overload. Diuresed with IV lasix and milrinone was increased to 0.375 mcg. Transitioned to torsemide 80 mg daily. Discharge weight was 137 pounds.  Admitted 7/31 through 01/24/2016 with end stage heart failure and bacteremia. PICC was removed but later replaced. ID followed and he was discharged on antibiotics. He was not discharged on diuretics. Discharge weight was   He presents today for HF follow up with his daughter. Overall feeling ok but complains of fatigue. His daughter thinks his ICD fired on 04/06/16.  Weight at home 130 pounds. Complaining of a cough at night. Unable to walk. Requires assistance with ADLs. SOB with exertion. Appetite poor.  Followed by Gary Gallegos.  He continues on milrinone.  His daughter gives him metolazone as needed.   ECHO 05/05/13: EF 10%, diff HK, grade II DD, mild MR, LA mod dilated, RV mildly dilated and sys fx mod reduced, mod TR ECHO 11/04/13  EF 10% mod-severe MR RV mod reduced. TAPSE 1.6  ECHO 01/2015 EF 10-15%   Labs 07/15/13 K 5.7 Creatinine 1.95 instructed to cut lisinopril 5 mg daily.  Labs 07/22/13 K 3.6 Creatinine 0.81 Dig 1.9 digoxin stopped Labs  08/17/13 K 3.7  Creatinine 0.8 Glucose 190           08/20/13: K+ 3.7, Creatinine 0.70, Glucose 123, Na 137           10/22/13: K 3.9, creatinine 0.69, glucose 179           02/11/14: K 4.3 cr 1.2           07/05/2014: K 5.4 Creatinine 0.96 potassium cut back to 20 meq once a day.            07/12/2014: K 4.4 Creatinine 0.95             11/09/2014: K 3.6 Creatinine 1.04             6/16: K 3.9, creatinine 1.01, hgb 11.9             12/29/14: K 3.9 Creatinine 1.19              01/22/2015: K 4.4 Creatinine 0.89              05/10/2015: K 4.3 Creatinine 1.04              08/23/2015; K 3.7 Creatinine 0.65   SH: Lives with his daughter, son in law, and 2 granddaughters.     ROS: All systems negative except as listed in HPI, PMH and Problem List.  Past Medical History:  Diagnosis Date  . AICD (automatic cardioverter/defibrillator) present   . Biventricular CHF (congestive heart failure)  a. Echo 8/16:  EF 10-15%, diff HK, trivial AI, mild MR, mild LAE, mod RVE, mild reduced RVF, mild TR, PASP 40 mmHg, mild eff without tamponade.   . Blind left eye   . Depression   . GERD (gastroesophageal reflux disease)   . Hyperlipidemia   . Hypertension   . Impaired vision in both eyes   . NICM (nonischemic cardiomyopathy) (Gary Gallegos)   . Type II diabetes mellitus ([place])     Current Outpatient Prescriptions  Medication Sig Dispense Refill  . milrinone (PRIMACOR) 20 MG/100ML SOLN infusion Inject 25.875 mcg/min into the vein continuous. 100 mL 999  . potassium chloride SA (K-DUR,KLOR-CON) 20 MEQ tablet Take 40 meq (2 tablets) ONLY ON DAYS HE TAKES TORSEMIDE. (Patient not taking: Reported on 04/12/2016)    . sertraline (ZOLOFT) 50 MG tablet Take 1 tablet (50 mg total) by mouth daily. (Patient not taking: Reported on 04/12/2016) 30 tablet 2  . torsemide (DEMADEX) 20 MG tablet Take 40 mg (2 tabs) AS NEEDED for weight gain of 5 lbs from baseline weight. (Patient not taking: Reported on 04/12/2016) 60 tablet 6   No current  facility-administered medications for this encounter.     Vitals:   04/12/16 1419  BP: 100/78  BP Location: Right Arm  Patient Position: Sitting  Cuff Size: Normal  Pulse: (!) 115  SpO2: 97%  Weight: 132 lb (59.9 kg)    PHYSICAL EXAM: General: Chronically ill appearing.  Arrived in a wheel chair. Daughter present  HEENT: normal; arcus senilus.  Neck: supple. JVP ~10 . Carotids 2+ bilaterally; no bruits. No lymphadenopathy or thryomegaly appreciated. Cor: PMI laterally displaced. Regular. No rubs. + S3; +6/5 systolic murmur  Tunneled PICC in R upper chest. Dressing CDI Lungs: Decreased.  Abdomen:  nontender, nondistended. No hepatosplenomegaly. No bruits or masses. Good bowel sounds. Extremities: no cyanosis, clubbing, rash; no edema    Neuro: alert & orientedx3, cranial nerves grossly intact. Moves all 4 extremities w/o difficulty. Affect pleasant.  ASSESSMENT & PLAN: 1) End Stage Chronic biventricular HF: NICM s/p ICD. EF 10% mod-severe MR RV mod reduced. He is on chronic home milrinone. NYHA class IIIb. He continues to decline. Not able to stand.  Requiring assistance with all ADLs. Continue milrinone for now. Discussed turning it off however he declines.  Mild volume overload noted. Continue diuretics as needed.   No BB with low output and hypotension.  - No ACEI with soft bp.  2)  Diabetes with diabetic retinopathy-   3) Sinus Tachycardia- Not candidate for ivabradine due to shock/milrinone.  4) Bacteremia- Gary Gallegos . On nafcillin and rifampin. Has follow up with ID.  5) DNR 6) VT- ICD fired 10/2 and 04/06/16. Discussed with Gary Gallegos and his device was reprogrammed during the visit to a conservative mode. Discussed turning off ICD however he declines. Faith requested magnet in the event ICD fires multiple times. [employer] will mail one to the their home.  They are aware he is end stage.   Continue Hospice Care. Continue Milrinone for now. HFSW met with them today.    Follow  up in 4 weeks.   Gary Erker NP-C  2:19 PM

## 2016-04-12 NOTE — Patient Instructions (Signed)
Follow up 6 weeks.  Do the following things EVERYDAY: 1) Weigh yourself in the morning before breakfast. Write it down and keep it in a log. 2) Take your medicines as prescribed 3) Eat low salt foods-Limit salt (sodium) to 2000 mg per day.  4) Stay as active as you can everyday 5) Limit all fluids for the day to less than 2 liters  

## 2016-04-12 NOTE — Progress Notes (Signed)
CSW referred to assist with supportive counseling. Patient currently on Hospice and resides with his daughter and son in law. Patient declining although daughter reports "he doesn't like to talk about it". She states he does "worry a lot and wants to spend as much time with his family as he can". Patient's daughter took patient to [place] to visit with his son who was unable to come visit with patient here in [place]. Patient very appreciative of the love and support from his daughter. CSW provided supportive intervention around issues of end of life and coping with illness. Patient and family appreciative of support and will follow up with CSW as needed. CSW will continue to follow for supportive intervention as needed.Lasandra Beech, LCSW [phone]

## 2016-04-16 ENCOUNTER — Ambulatory Visit (INDEPENDENT_AMBULATORY_CARE_PROVIDER_SITE_OTHER): Admitting: Infectious Diseases

## 2016-04-16 VITALS — BP 94/59 | HR 97 | Temp 97.7°F | Wt 132.0 lb

## 2016-04-16 DIAGNOSIS — A4101 Sepsis due to Methicillin susceptible Staphylococcus aureus: Secondary | ICD-10-CM

## 2016-04-16 MED ORDER — RIFAMPIN 300 MG PO CAPS: 300.0000 mg | ORAL_CAPSULE | Freq: Every day | ORAL | 3 refills | Status: AC

## 2016-04-16 MED ORDER — CEPHALEXIN 500 MG PO CAPS: 500.0000 mg | ORAL_CAPSULE | Freq: Two times a day (BID) | ORAL | 3 refills | Status: AC

## 2016-04-16 NOTE — Assessment & Plan Note (Signed)
Will repeat his BCx today (at his daughter's request. I let them know that it would not change his course and he was unlikely to be re-admitted given that he is hospice).  [phone] will call home health for [facility] flush Will restart his anbx- keflex, rifampin. [facility].  Will see him back in 3 months.

## 2016-04-16 NOTE — Progress Notes (Signed)
   Subjective:    Patient ID: Gary Gallegos, male    DOB: [birth year], 70 y.o.   MRN: [id number]  HPI 70 yo M with hx of DM2, end stage CHF (EF 10%, on AICD, home milrinone). He was adm 7-31 to --> 8-7 with MSSA bacteremia. He did not have TEE.  Repeat BCx 8-1 and 8-4 negative.  His PIC was exchanged.  He was d/c home with hospice for 2 weeks on 8-7. He was also d/c home with nafcillin and rifampin. He completed these on 03-01-16 He had f/u at CHF clinic earlier this month notable for firing of his AICD, and further discussion regarding turning off his AICD.  He is still on milrinone, 2 ports in his PIC, daughter believes one does not work.  His daughter has concerns that he has been confused since off anbx. Pt states that his appetite has not been good. Has some episodes of n/v with rifampin.   He is still on home hospice.   Review of Systems  Constitutional: Negative for chills and fever.  Respiratory: Positive for cough.   Gastrointestinal: Negative for constipation and diarrhea.  Genitourinary: Negative for difficulty urinating.  is unable to walk.      Objective:   Physical Exam  Constitutional: He appears well-developed and well-nourished.  HENT:  Mouth/Throat: No oropharyngeal exudate.  Eyes: EOM are normal. Pupils are equal, round, and reactive to light.  Neck: Neck supple.  Cardiovascular: Normal rate, regular rhythm and normal heart sounds.   Pulmonary/Chest: Effort normal and breath sounds normal.    Abdominal: Soft. Bowel sounds are normal. There is no tenderness. There is no rebound.  Musculoskeletal: He exhibits no edema.  Lymphadenopathy:    He has no cervical adenopathy.          Assessment & Plan:

## 2016-04-23 LAB — CULTURE, BLOOD (SINGLE)
ORGANISM ID, BACTERIA: NO GROWTH
ORGANISM ID, BACTERIA: NO GROWTH

## 2016-05-24 ENCOUNTER — Inpatient Hospital Stay (HOSPITAL_COMMUNITY): Admission: RE | Admit: 2016-05-24 | Source: Ambulatory Visit

## 2016-07-17 ENCOUNTER — Ambulatory Visit: Admitting: Infectious Diseases

## 2016-08-15 ENCOUNTER — Encounter: Payer: Self-pay | Admitting: Gastroenterology

## 2017-01-02 IMAGING — US IR FLUORO GUIDE CV LINE*R*
1 series · 1 of 1 positions shown · non-contrast
Comparison: none

INDICATION: Arrhythmia

[Series 1: ir fluoro/shunt/fist · 1 of 1 slices shown]
[im 1/1]
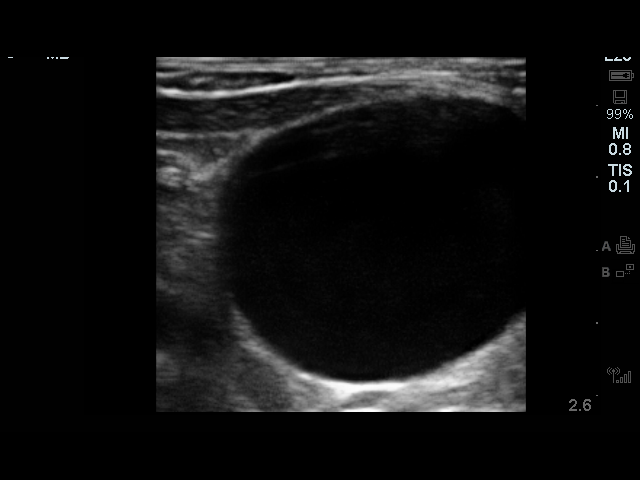

[1 of 1 positions shown; findings below may reference images not displayed]

EXAM:
RIGHT JUGULAR TUNNELED PICC LINE PLACEMENT WITH ULTRASOUND AND
FLUOROSCOPIC GUIDANCE

MEDICATIONS:
None; The antibiotic was administered within an appropriate time
interval prior to skin puncture.

ANESTHESIA/SEDATION:
None.

FLUOROSCOPY TIME:  Fluoroscopy Time:  minutes 42 seconds (2 mGy).

COMPLICATIONS:
None immediate.

PROCEDURE:
The patient was advised of the possible risks andcomplications and
agreed to undergo the procedure. The patient was then brought to the
angiographic suite for the procedure.

The right neck was prepped with chlorhexidine, draped in the usual
sterile fashion using maximum barrier technique (cap and mask,
sterile gown, sterile gloves, large sterile sheet, hand hygiene and
cutaneous antiseptic). Local anesthesia was attained by infiltration
with 1% lidocaine.

Ultrasound demonstrated patency of the right jugular vein, and this
was documented with an image. Under real-time ultrasound guidance,
this vein was accessed with a 21 gauge micropuncture needle and
image documentation was performed. An extended needle tract was
performed for tunneling. The needle was exchanged over a guidewire
for a peel-away sheath through which a 23 cm 5 French double lumen
power injectable tunneled PICC was advanced, and positioned with its
tip at the lower SVC/right atrial junction. The cuff was positioned
in the subcutaneous tract. Fluoroscopy during the procedure and
fluoro spot radiograph confirms appropriate catheter position. The
catheter was flushed, secured to the skin with Prolene sutures, and
covered with a sterile dressing.
IMPRESSION: Successful placement of a tunneled right jugular PICC with
sonographic and fluoroscopic guidance. The catheter is ready for
use.
